# Patient Record
Sex: Female | Born: 1950
Health system: Southern US, Community
[De-identification: ages and names within clinical notes are randomized; demographics above are authoritative.]

## PROBLEM LIST (undated history)

## (undated) DIAGNOSIS — E785 Hyperlipidemia, unspecified: Secondary | ICD-10-CM

## (undated) DIAGNOSIS — L989 Disorder of the skin and subcutaneous tissue, unspecified: Secondary | ICD-10-CM

## (undated) DIAGNOSIS — E039 Hypothyroidism, unspecified: Secondary | ICD-10-CM

## (undated) DIAGNOSIS — I10 Essential (primary) hypertension: Secondary | ICD-10-CM

## (undated) DIAGNOSIS — K5790 Diverticulosis of intestine, part unspecified, without perforation or abscess without bleeding: Secondary | ICD-10-CM

## (undated) HISTORY — DX: Diverticulosis of intestine, part unspecified, without perforation or abscess without bleeding: K57.90

## (undated) HISTORY — PX: CHOLECYSTECTOMY: SHX55

## (undated) HISTORY — DX: Hypothyroidism, unspecified: E03.9

## (undated) HISTORY — PX: ABDOMINAL HYSTERECTOMY: SHX81

## (undated) HISTORY — PX: TONSILLECTOMY: SUR1361

## (undated) HISTORY — DX: Essential (primary) hypertension: I10

## (undated) HISTORY — DX: Hyperlipidemia, unspecified: E78.5

## (undated) HISTORY — DX: Disorder of the skin and subcutaneous tissue, unspecified: L98.9

---

## 2000-12-24 ENCOUNTER — Emergency Department (HOSPITAL_COMMUNITY): Admission: EM | Admit: 2000-12-24 | Discharge: 2000-12-24 | Payer: Self-pay | Admitting: Emergency Medicine

## 2002-12-09 ENCOUNTER — Encounter: Payer: Self-pay | Admitting: Emergency Medicine

## 2002-12-09 ENCOUNTER — Inpatient Hospital Stay (HOSPITAL_COMMUNITY): Admission: EM | Admit: 2002-12-09 | Discharge: 2002-12-11 | Payer: Self-pay | Admitting: Emergency Medicine

## 2008-07-31 ENCOUNTER — Ambulatory Visit (HOSPITAL_COMMUNITY): Admission: RE | Admit: 2008-07-31 | Discharge: 2008-07-31 | Payer: Self-pay | Admitting: Internal Medicine

## 2008-08-09 ENCOUNTER — Ambulatory Visit (HOSPITAL_COMMUNITY): Admission: RE | Admit: 2008-08-09 | Discharge: 2008-08-09 | Payer: Self-pay | Admitting: Internal Medicine

## 2009-11-25 IMAGING — MG MM DIGITAL DIAGNOSTIC UNILAT R
4 series · 4 of 4 positions shown · non-contrast
Comparison: Mammogram 07/31/2008

CLINICAL DATA: Possible mass right breast identified on recent
screening mammogram

DIGITAL DIAGNOSTIC RIGHT MAMMOGRAM WITHOUT CAD

[R CC]
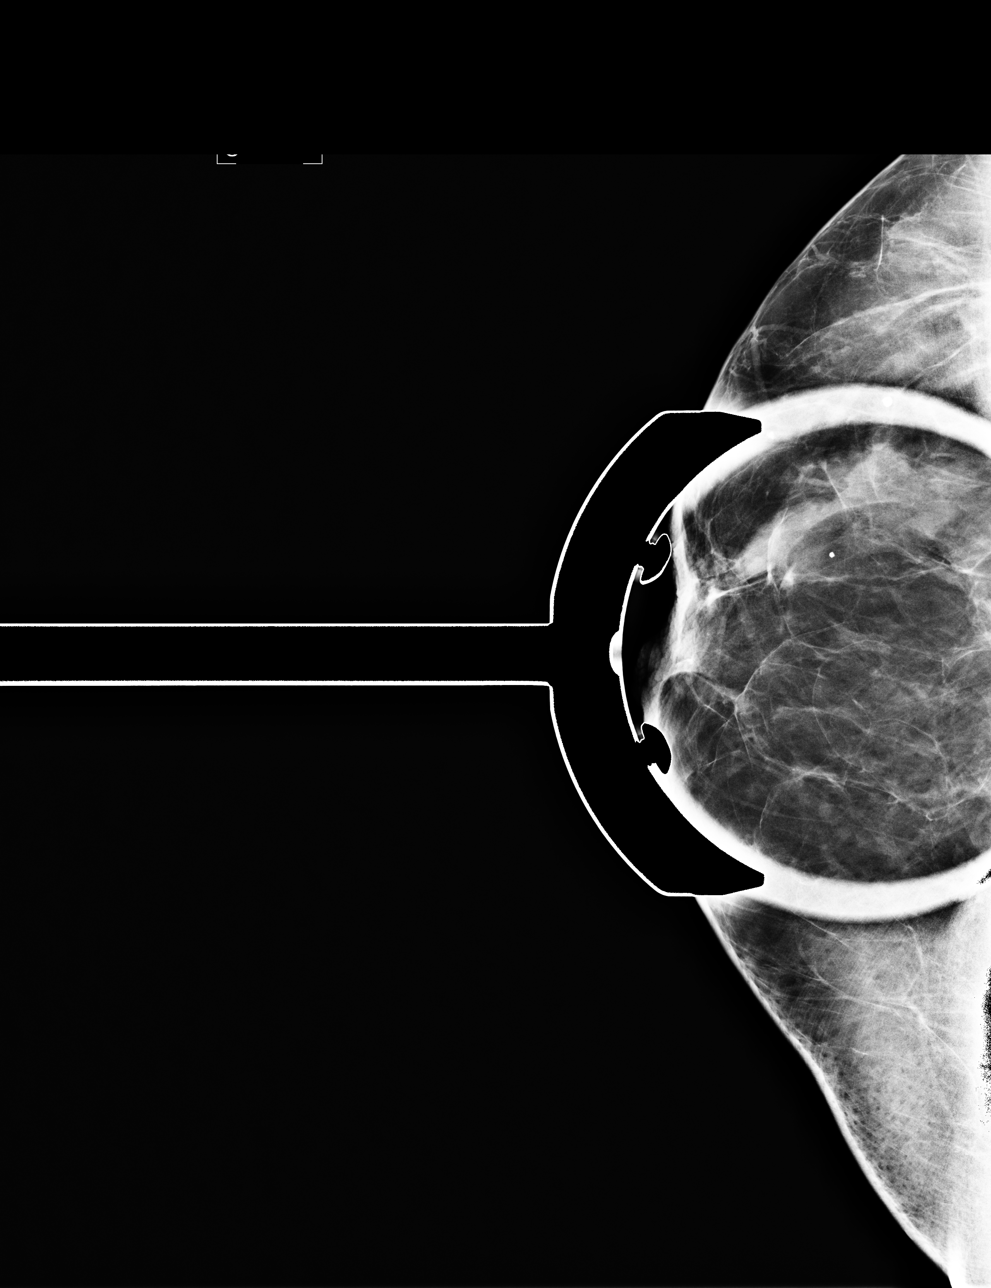

[R MLO (1 of 3)]
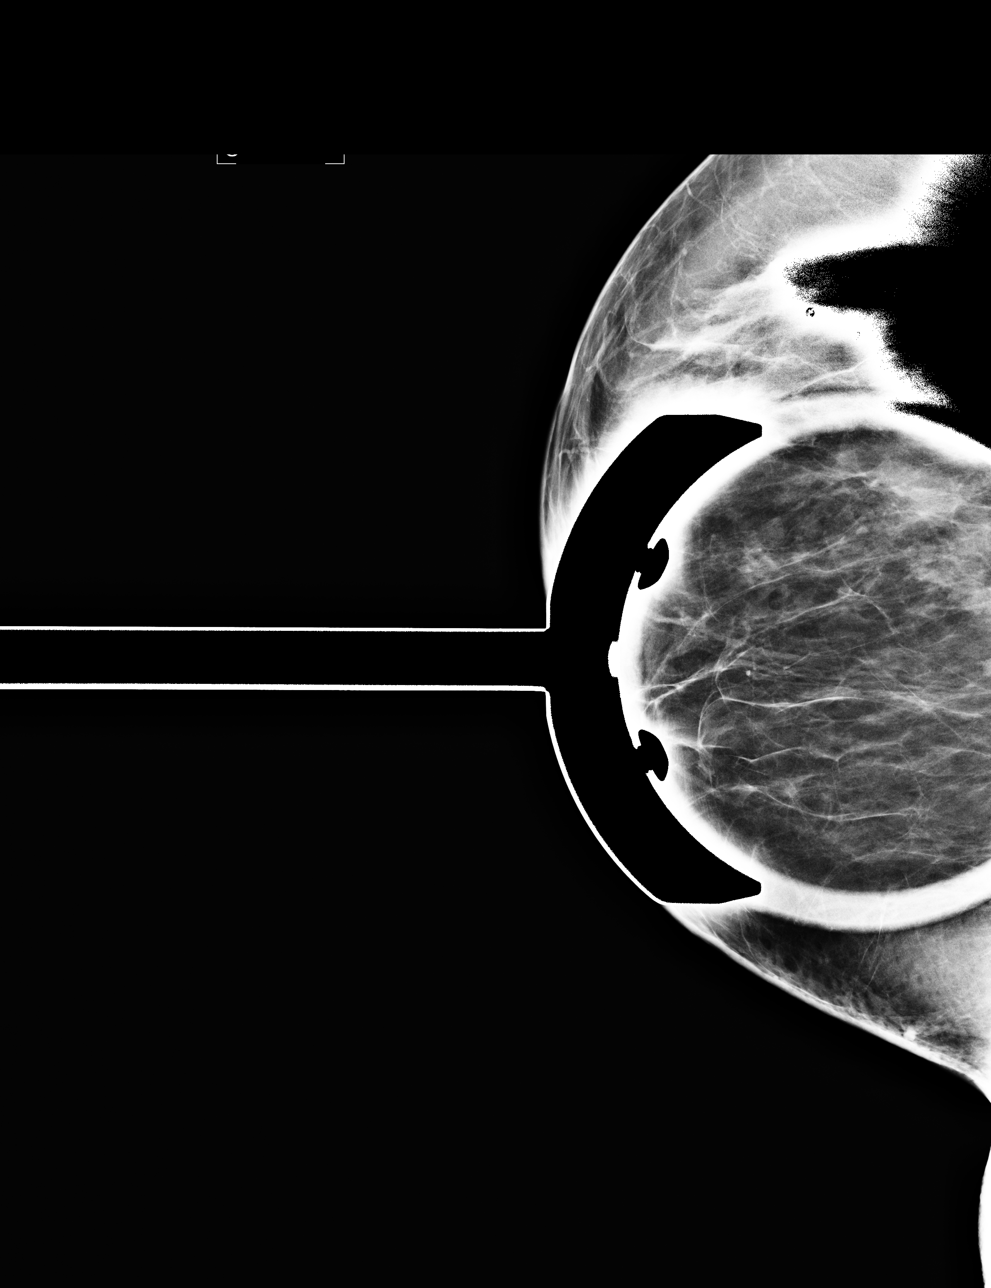

[R MLO (2 of 3)]
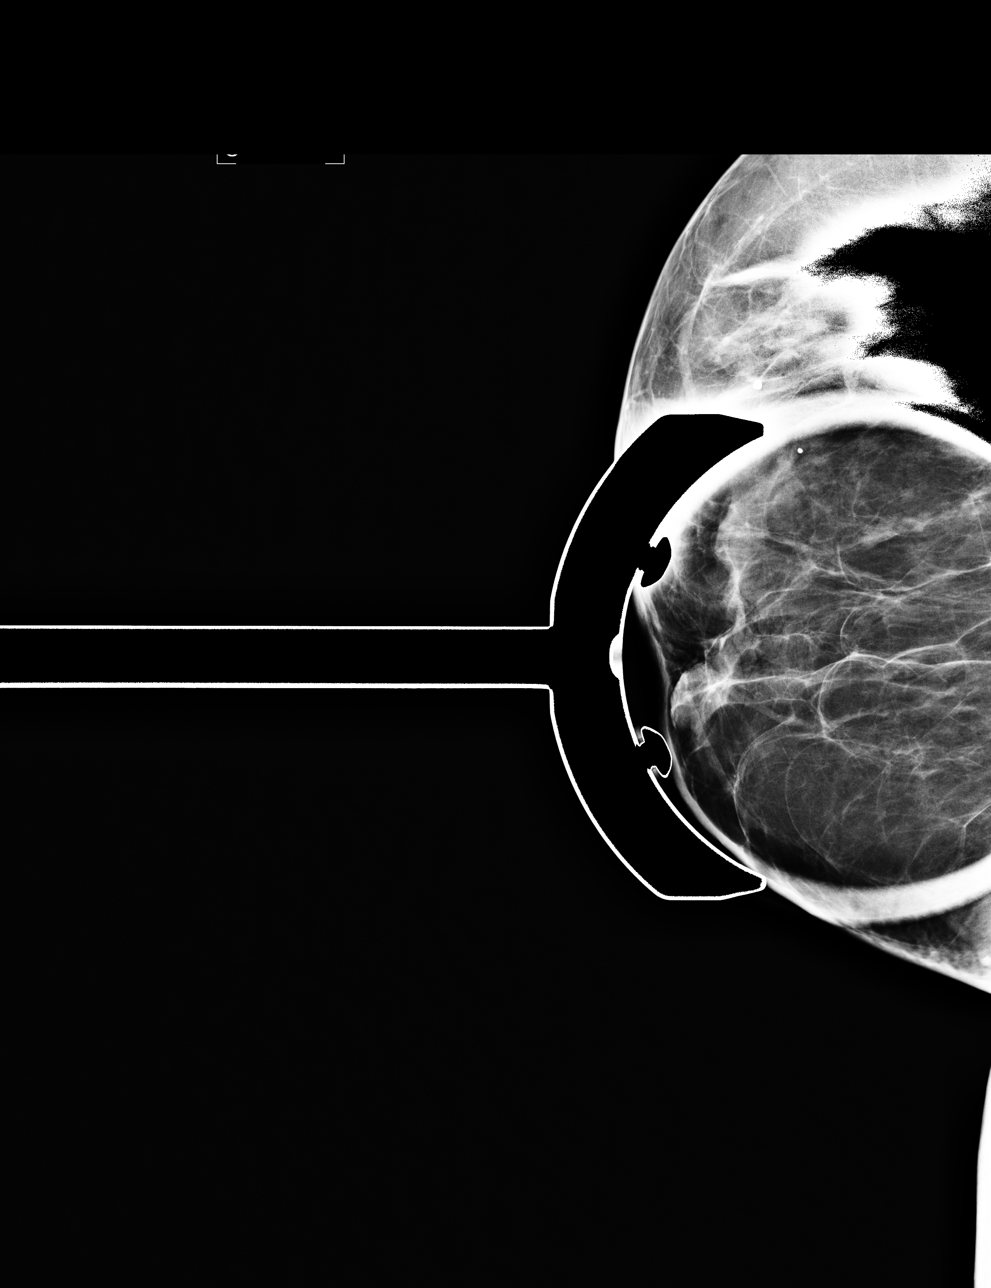

[R MLO (3 of 3)]
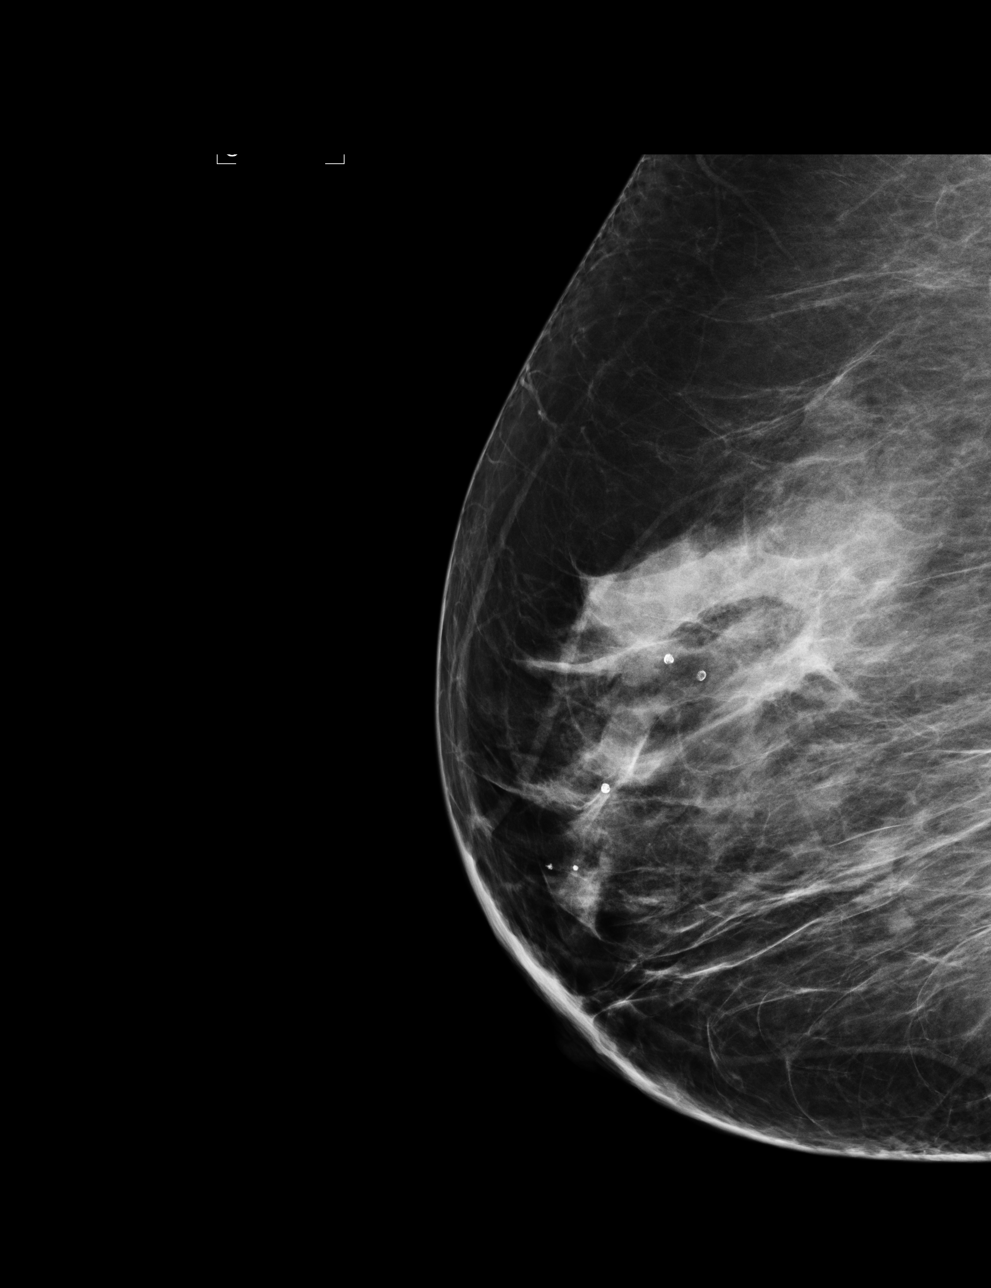

[4 of 4 positions shown; findings below may reference images not displayed]

FINDINGS: An MLO view of the right breast with the nipple in
profile was performed, as the nipple was rolled on the initial MLO
view performed on 07/31/2008.  On today's MLO view, no mass is
identified in the anterior right breast.  Additionally, focal spot
compression views of the retroareolar right breast show scattered
fibroglandular densities, without evidence of persistent mass or
architectural distortion.
IMPRESSION: No evidence of malignancy is identified in the right breast.
Suggest screening mammogram of both breasts in 1 year.

BI-RADS CATEGORY 1:  Negative.

REF:A1 DICTATED: 08/09/2008 [DATE]

## 2009-12-31 ENCOUNTER — Inpatient Hospital Stay (HOSPITAL_COMMUNITY): Admission: EM | Admit: 2009-12-31 | Discharge: 2010-01-03 | Payer: Self-pay | Admitting: Emergency Medicine

## 2009-12-31 ENCOUNTER — Encounter (INDEPENDENT_AMBULATORY_CARE_PROVIDER_SITE_OTHER): Payer: Self-pay | Admitting: General Surgery

## 2010-07-03 ENCOUNTER — Ambulatory Visit (HOSPITAL_COMMUNITY)
Admission: RE | Admit: 2010-07-03 | Discharge: 2010-07-03 | Payer: Self-pay | Source: Home / Self Care | Attending: Internal Medicine | Admitting: Internal Medicine

## 2010-09-21 LAB — URINALYSIS, ROUTINE W REFLEX MICROSCOPIC
Glucose, UA: NEGATIVE mg/dL
Protein, ur: 100 mg/dL — AB
Urobilinogen, UA: 1 mg/dL (ref 0.0–1.0)
pH: 6 (ref 5.0–8.0)

## 2010-09-21 LAB — CBC
HCT: 39.3 % (ref 36.0–46.0)
Hemoglobin: 13.1 g/dL (ref 12.0–15.0)
MCH: 28.6 pg (ref 26.0–34.0)
MCH: 28.8 pg (ref 26.0–34.0)
MCH: 29.1 pg (ref 26.0–34.0)
MCHC: 33.9 g/dL (ref 30.0–36.0)
MCV: 84.2 fL (ref 78.0–100.0)
MCV: 85 fL (ref 78.0–100.0)
Platelets: 291 10*3/uL (ref 150–400)
Platelets: 330 10*3/uL (ref 150–400)
RDW: 14.1 % (ref 11.5–15.5)
RDW: 14.6 % (ref 11.5–15.5)
WBC: 11.8 10*3/uL — ABNORMAL HIGH (ref 4.0–10.5)
WBC: 15.2 10*3/uL — ABNORMAL HIGH (ref 4.0–10.5)

## 2010-09-21 LAB — POCT I-STAT, CHEM 8
BUN: 12 mg/dL (ref 6–23)
Chloride: 104 mEq/L (ref 96–112)
Creatinine, Ser: 0.9 mg/dL (ref 0.4–1.2)
Glucose, Bld: 135 mg/dL — ABNORMAL HIGH (ref 70–99)
HCT: 42 % (ref 36.0–46.0)
TCO2: 24 mmol/L (ref 0–100)

## 2010-09-21 LAB — COMPREHENSIVE METABOLIC PANEL
ALT: 117 U/L — ABNORMAL HIGH (ref 0–35)
AST: 70 U/L — ABNORMAL HIGH (ref 0–37)
Albumin: 2.7 g/dL — ABNORMAL LOW (ref 3.5–5.2)
Alkaline Phosphatase: 75 U/L (ref 39–117)
BUN: 9 mg/dL (ref 6–23)
Chloride: 107 mEq/L (ref 96–112)
Creatinine, Ser: 0.93 mg/dL (ref 0.4–1.2)
GFR calc Af Amer: >60 mL/min (ref >60)
Glucose, Bld: 127 mg/dL — ABNORMAL HIGH (ref 70–99)
Sodium: 138 mEq/L (ref 135–145)
Total Bilirubin: 0.7 mg/dL (ref 0.3–1.2)

## 2010-09-21 LAB — DIFFERENTIAL
Basophils Relative: 0 % (ref 0–1)
Lymphocytes Relative: 9 % — ABNORMAL LOW (ref 12–46)
Neutro Abs: 13.5 10*3/uL — ABNORMAL HIGH (ref 1.7–7.7)
Neutrophils Relative %: 83 % — ABNORMAL HIGH (ref 43–77)

## 2010-09-21 LAB — URINE CULTURE: Colony Count: >=100000

## 2010-09-21 LAB — WET PREP, GENITAL
Trich, Wet Prep: NONE SEEN
WBC, Wet Prep HPF POC: NONE SEEN
Yeast Wet Prep HPF POC: NONE SEEN

## 2010-09-21 LAB — HEPATIC FUNCTION PANEL: Total Protein: 7.8 g/dL (ref 6.0–8.3)

## 2010-09-21 LAB — URINE MICROSCOPIC-ADD ON

## 2010-09-21 LAB — LIPASE, BLOOD: Lipase: 26 U/L (ref 11–59)

## 2010-11-21 NOTE — H&P (Signed)
NAME:  TANVEER, DOBBERSTEIN                      ACCOUNT NO.:  [id number]   MEDICAL RECORD NO.:  [id number]                   PATIENT TYPE:  INP   LOCATION:  0108                                 FACILITY:  [facility]   PHYSICIAN:  Abigail Miyamoto, M.D.              DATE OF BIRTH:  [birth year]   DATE OF ADMISSION:  12/08/2002  DATE OF DISCHARGE:                                HISTORY & PHYSICAL   CHIEF COMPLAINT:  Abdominal pain.   HISTORY:  This is a pleasant 61 year old female who presents with a several  day history of cramping abdominal pain.  She reports the pain starts more in  her epigastric and periumbilical area and has moved some to the right lower  quadrant.  She denies any nausea or vomiting with this and she has been  moving her bowels normally.  She denies any diarrhea or blood in stool.  She  also reports only a low grade temperature.  She reports no previous similar  history of abdominal pain.  She denies any dysuria.  She also has no chest  pain or shortness of breath.   PAST MEDICAL HISTORY:  Negative.   PAST SURGICAL HISTORY:  She has had a hysterectomy.   MEDICATIONS:  None.   ALLERGIES:  No known drug allergies.   SOCIAL HISTORY:  She does smoke a pack of cigarettes a day and drinks  alcohol rarely.   FAMILY HISTORY:  Negative for Crohn's disease.   REVIEW OF SYSTEMS:  Otherwise negative.   PHYSICAL EXAMINATION:  GENERAL:  Reveals an obese female in no acute  distress.  She is moderately well-appearing in no acute distress.  VITAL SIGNS:  Temperature is 97.5, respiratory rate is 16, pulse is 106,  blood pressure is 130/82.  EYES:  She is anicteric.  Her pupils are reactive.  EAR/NOSE/MOUTH AND THROAT:  Her ears and nose are normal in appearance.  Hearing is normal.  Oropharynx is clear.  NECK:  Supple.  There is no cervical adenopathy.  There is no thyromegaly.  LUNGS:  Effort is normal.  Clear to auscultation bilaterally.  CARDIOVASCULAR:  She is  tachycardic with a regular, rate and rhythm and no  murmurs.  There is no peripheral edema.  ABDOMEN:  Soft.  There is minimal guarding and tenderness in the epigastrium  and right lower quadrant.  There are no masses.  There is no  hepatosplenomegaly.  There are no hernias.  EXTREMITIES:  Warm and well perfused.   LABORATORY DATA:  Patient has a white blood count of 25.1, hemoglobin 15.1,  hematocrit 43.6 and platelets of 445.  Electrolytes are normal with a  potassium of 4.0, BUN of 10 and creatinine 0.8.  Liver function tests are  also normal.   X-RAY DATA:  Patient has a CAT scan of the abdomen and pelvis which shows  her to have inflammation of the terminal ileum.  There is no evidence of  perforation.  No free fluid.  The appendix is normal in size.  There are  also multiple renal cysts bilaterally and a small 1.3 cm right adrenal mass  consistent with an adenoma.  There is also a large gallstone.   ASSESSMENT/PLAN:  This is a 60 year old female with terminal ileitis.  It is  unsure whether this represents an infectious etiology versus inflammatory  disease as in Crohn's.  At this point the plan will be to place the patient  on IV antibiotics, admit her to the hospital.  If the process does not  resolve quickly we may need a gastroenterology consult for endoscopy.  We  will hold on the steroids at this time.                                                Abigail Miyamoto, M.D.    DB/MEDQ  D:  12/09/2002  T:  12/09/2002  Job:  [id number]

## 2012-12-15 ENCOUNTER — Telehealth (HOSPITAL_COMMUNITY): Payer: Self-pay | Admitting: Dietician

## 2012-12-15 NOTE — Telephone Encounter (Signed)
Received referral via fax from Dr. Margo Aye for dx: obesity, high cholesterol.

## 2012-12-15 NOTE — Telephone Encounter (Signed)
Called and left message on pt voicemail at [phone].

## 2012-12-15 NOTE — Telephone Encounter (Signed)
Pt left voicemail at [phone]. Called back at [phone] and left another message on voicemail.

## 2012-12-16 NOTE — Telephone Encounter (Signed)
Received message from pt at [phone]. Called back [phone]. Appointment scheduled for 12/11/12 at 1000.

## 2013-01-10 ENCOUNTER — Encounter (HOSPITAL_COMMUNITY): Payer: Self-pay | Admitting: Dietician

## 2013-01-10 DIAGNOSIS — E785 Hyperlipidemia, unspecified: Secondary | ICD-10-CM | POA: Insufficient documentation

## 2013-01-10 DIAGNOSIS — E039 Hypothyroidism, unspecified: Secondary | ICD-10-CM | POA: Insufficient documentation

## 2013-01-10 DIAGNOSIS — L989 Disorder of the skin and subcutaneous tissue, unspecified: Secondary | ICD-10-CM | POA: Insufficient documentation

## 2013-01-10 DIAGNOSIS — I10 Essential (primary) hypertension: Secondary | ICD-10-CM | POA: Insufficient documentation

## 2013-01-10 NOTE — Progress Notes (Signed)
Outpatient Initial Nutrition Assessment  Date:01/10/2013   Appt Start Time: 0923  Referring Physician: Dr. Margo Aye Reason for Visit: obesity, HTN  Nutrition Assessment:  Height: 5\' 2"  (157.5 cm)   Weight: 222 lb (100.699 kg)   IBW: 110# %IBW: 202% UBW: 210# %UBW: 106% Body mass index is 40.59 kg/(m^2). Meets criteria for extreme obesity, class III. Goal Weight: 200# (10% weight loss of current body weight) Weight hx: Pt reports UBW: 210-215#, which she last weighed in 06/2012 (12# (5.7%) wt gain). She reports he maintained a wt of 130# up until retiring 10 years ago. This is a 92# (70%) wt gain x 10 years  Estimated nutritional needs: 1500-1600 kcals daily, 81-101 grams protein daily, 1.5-1.6 L fluid daily  PMH:  Past Medical History  Diagnosis Date  . Hypothyroid   . HTN (hypertension)   . Dyslipidemia   . Skin disorder     Medications:  Current Outpatient Rx  Name  Route  Sig  Dispense  Refill  . levothyroxine (SYNTHROID, LEVOTHROID) 112 MCG tablet   Oral   Take 112 mcg by mouth daily before breakfast.         . losartan (COZAAR) 50 MG tablet   Oral   Take 50 mg by mouth daily.         . Multiple Vitamin (MULTIVITAMIN WITH MINERALS) TABS   Oral   Take 1 tablet by mouth daily.           Labs: CMP     Component Value Date/Time   NA 138 01/02/2010 0410   K 4.2 01/02/2010 0410   CL 107 01/02/2010 0410   CO2 26 01/02/2010 0410   GLUCOSE 127* 01/02/2010 0410   BUN 9 01/02/2010 0410   CREATININE 0.93 01/02/2010 0410   CALCIUM 7.8* 01/02/2010 0410   PROT 6.3 01/02/2010 0410   ALBUMIN 2.7* 01/02/2010 0410   AST 70* 01/02/2010 0410   ALT 117* 01/02/2010 0410   ALKPHOS 75 01/02/2010 0410   BILITOT 0.7 01/02/2010 0410   GFRNONAA >60 01/02/2010 0410   GFRAA  Value: >60        The eGFR has been calculated using the MDRD equation. This calculation has not been validated in all clinical situations. eGFR's persistently <60 mL/min signify possible Chronic Kidney Disease. 01/02/2010 0410     Lipid Panel  No results found for this basename: chol, trig, hdl, cholhdl, vldl, ldlcalc     No results found for this basename: HGBA1C   Lab Results  Component Value Date   CREATININE 0.93 01/02/2010    Most recent labs (Dr. Margo Aye) from 12/06/12: Na: 135. K: 4.2, Cl: 107, CO2: 20, BUN: 11, Creat: 0.79, Glucose: 98, Total Cholesterol: 173, Triglycerides: 228. HDL: 33, LDL: 94  Lifestyle/ social habits: Ms. Storlie lives alone in [place], [place]. 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 She reports her stress level at "-4", citing she has no stress. She reports physical activity in the past, including daily swimming at [facility], which she discontinued 6 years aog because she did not like the pool conditions. She reports from September to May, she will jog in place for 45 minutes daily. She reports she has not been doing this as much recently, as her daily weeding and gardening aggravates her ankles and achilles tendon. She will also walk occasionally.   Nutrition hx/habits: Ms. Audia reports no recent  Changes to her diet. She states "I monitor my sweet intake. When I  do cook sweets, I use applesauce instead of fat and cut 1/2 of the sugar and substitute splenda for the other 1/2. I don't eat fast food. 95% of my food is homemade". She enjoys cooking and makes her own salad dressing. She incorporates a lot of fresh vegetables. She informs me that most of her proteins come from beans, fish, and grains (farro and quinoa mostly).  She reports that she was able to maintain her weight prior to retirement because she was on a schedule and now gets bored easily. She occasionally admits to snacking. She also reports she "does well with portion control", using a rammekin to measure out most foods. However, there may be some underreporting.   Diet recall: Breakfast (6 AM): eggs, TVP sausage OR broccoli, black beans and black coffee; Lunch (12 PM): 2 in piece of  salmon and broccoli in sauce (homemade curry sauce made with fresh mushroom rue, low fat milk, and curry powder); Dinner (4 PM): salad with greens, carrot, 1/2 avocado, olive, beets, and salad dressing (oil, mustard, siracha, and sugar); HS snack (10 PM): PBJ on toast or biscuit.   Nutrition Diagnosis: Involuntary weight gain r/t physical inactivity, disordered eating patter AEB diet recall/ interview, 70% wt gain x 10 years.  Nutrition Intervention: Nutrition rx: 1200 kcal NAS, no sugar added diet; low calorie beverages only; 3 meals per day; no snacks; minimum of 2.5 hours physical activity daily  Education/Counseling Provided: Educated pt on principles of weight management. Discussed principles of energy expenditure and how changes in diet and physical activity affect weight status. Discussed nutritional content of commonly eaten foods and suggested healthier alternatives. Educated pt on plate method and a general, healthful diet that includes low fat dairy, lean meats, whole fruits and vegetables, and whole grains most often. Discussed importance of a healthy diet along with regular physical activity (at least 30 minutes 5 times per week) to achieve weight loss goals. Encouraged slow, moderate weight loss (0.5-2# weight loss per week) and adopting healthy lifestyle changes vs. obtaining a certain body type or weight. Encouraged weighing self weekly at a consistent day and time of choice. Showed pt functionality of MyFitnessPal and encouraged using a food diary to better track caloric intake. Provided AND Nutrition Care Manual's "Weight Loss Tips" and "!500 Calorie Diet" handouts. Used TeachBack to assess understanding.   Understanding, Motivation, Ability to Follow Recommendations: Expect fair compliance.   Monitoring and Evaluation: Goals: 1) 0.5-2# wt loss per week; 2) At least 30 minutes of physical activity daily  Recommendations: 1) For weight loss: No Less than 1200 kcals daily; 2) Keep food  diary; 3) Break exercise up into smaller, more frequent sessions; 4) Find a hobby and develop a daily schedule with hobbies, exercise, and household duties  F/U: 4-8 weeks. F/u scheduled for Tuesday, 02/28/13 at 0900.   Melody Haver, RD, LDN 01/10/2013  Appt EndTime: 1100

## 2013-01-11 ENCOUNTER — Telehealth (HOSPITAL_COMMUNITY): Payer: Self-pay | Admitting: Dietician

## 2013-01-11 NOTE — Telephone Encounter (Signed)
Received voicemail left on [phone]. Pt reports she is frustrated with MyFitnessPal app and is requesting an alternative to calorie counting.

## 2013-01-11 NOTE — Telephone Encounter (Signed)
Called at [phone]. Gave pt alternative calorie tracker (Calorie Counter, Lose It!, [employer]). Also discussed [email or web address], for nutrition information. Pt grateful for information. Also educated briefly on diet to lower triglycerides.

## 2013-02-28 ENCOUNTER — Encounter (HOSPITAL_COMMUNITY): Payer: Self-pay | Admitting: Dietician

## 2013-02-28 NOTE — Progress Notes (Signed)
Follow-Up Outpatient Nutrition Note Date: 02/28/2013  Appt Start Time: 0839  Nutrition Assessment:  Current weight: Weight: 218 lb (98.884 kg)  BMI: Body mass index is 39.86 kg/(m^2).  Weight changes: -4# (1.8%) x 6 weeks  Bethany Cunningham reports she feels well today. She is both disappointment and proud of her progress. She wonders if she should have lost weight. She has been cutting back on bread and sweets so that she can incorporate a 1/4 cup serving of ice cream daily into her diet. She reports that her plan is to slowly decrease ice cream intake to a few times per week.  She continues to use to Lose It app, but reports frustration of the accuracy of it; she reports that she is unable to enter her own recipes into the app and, therefore, she must make an educated guess. Snacks include grapes and 1/2 pbj sandwich. She reports average calorie intake is 1200 kcals. She is taking 1000-1200 steps daily, per her pedometer. She is trying to increase the amount of "aerobic steps" per day.  She reports she is also using the RD telephone services provided by the state.   Labs: CMP     Component Value Date/Time   NA 138 01/02/2010 0410   K 4.2 01/02/2010 0410   CL 107 01/02/2010 0410   CO2 26 01/02/2010 0410   GLUCOSE 127* 01/02/2010 0410   BUN 9 01/02/2010 0410   CREATININE 0.93 01/02/2010 0410   CALCIUM 7.8* 01/02/2010 0410   PROT 6.3 01/02/2010 0410   ALBUMIN 2.7* 01/02/2010 0410   AST 70* 01/02/2010 0410   ALT 117* 01/02/2010 0410   ALKPHOS 75 01/02/2010 0410   BILITOT 0.7 01/02/2010 0410   GFRNONAA >60 01/02/2010 0410   GFRAA  Value: >60        The eGFR has been calculated using the MDRD equation. This calculation has not been validated in all clinical situations. eGFR's persistently <60 mL/min signify possible Chronic Kidney Disease. 01/02/2010 0410    Lipid Panel  No results found for this basename: chol, trig, hdl, cholhdl, vldl, ldlcalc     No results found for this basename: HGBA1C   Lab Results   Component Value Date   CREATININE 0.93 01/02/2010     Diet recall: Breakfast: 2 scrambled eggs with 1tbs shredded cheese, 2 tbs ketchup, steamed hash brown patty; Lunch: 1/4 cup black beans, 1/4 cup morning [employer] meat protein, peppers, tomatillos; Dinner: 1/2 bell pepper, black olives, 1/3 tomato, feta cheese. Beverages are coffee and water.   Nutrition Diagnosis: Involuntary weight gain r/t physical inactivity, disordered eating pattern- progressing  Nutrition Intervention: Nutrition rx: 1200 kcal NAS, no sugar added diet; low calorie beverages only; 3 meals per day; no snacks; minimum of 2.5 hours physical activity daily  Education/ counseling provided: Reviewed food diary and exercise. Praised pt on progress made. Discussed specific ways to improve diet and increase physical activity. Had long discussion about make positive lifelong change to promote a healthy lifestyle. Teachback method used.  Understanding/Motivation/ Ability to follow recommendations: Expect fair to good compliance.  Monitoring and Evaluation: Previous Goals: 1) 0.5-2# wt loss per week; 2) At least 30 minutes of physical activity daily  Goals for next visit: 1) 0.5-2# wt loss per week; 2) At least 30 minutes of physical activity daily  Recommendations: 1) Continue with food diary; 2) Slowly increase intensity of exercise  F/U: 4-6 weeks. Scheduled for Tuesday, 04/18/13 at 0900.  Bethany Cunningham, RD, LDN Date:02/28/2013 Appt EndTime: 1610

## 2013-03-31 ENCOUNTER — Telehealth (HOSPITAL_COMMUNITY): Payer: Self-pay | Admitting: Dietician

## 2013-03-31 NOTE — Telephone Encounter (Signed)
Received voicemail from pt left on 9/25 at 1719. Pt would like to reschedule appointment on 10/14.

## 2013-03-31 NOTE — Telephone Encounter (Signed)
Called at [phone]. Appointment rescheduled for 04/24/13 at 0900.

## 2013-04-24 ENCOUNTER — Encounter (HOSPITAL_COMMUNITY): Payer: Self-pay | Admitting: Dietician

## 2013-04-24 NOTE — Progress Notes (Signed)
Follow-Up Outpatient Nutrition Note Date: 04/24/2013  Appt Start Time: 0839  Nutrition Assessment:  Current weight: Weight: 214 lb (97.07 kg)  BMI: Body mass index is 39.13 kg/(m^2).  Weight changes: -4# (1.9%) x 6 weeks  Bethany Cunningham continues to make good progress. She recently got dental work done, but reports no problems. She reports that her stomach has "been going out", but attributes this to eat a large amount of tomatoes. She also reports that she ate an "unsually rich" dish the other day (quiche with butternut squash, cheese, creme fresh, and heavy cream), which caused her to have an upset stomach. Diet habits remain the same. She reports she has not been eating many fruits, due to limited seasonal fruits she enjoys. She reports she continues to use to Lose It! App and that she won the "Truck Full of Vegetables" badge for increased vegetable intake.  She has also been using teas as broths other than broth for added flavors and less calories.  She also reports she has increased her intensity of exercise. She typically engages in about 1.5 hours of physical activity daily. She does 45 minutes of walking in the AM and PM; she reports 30 minutes are "aerobic" steps, per her pedometer.   Labs: CMP     Component Value Date/Time   NA 138 01/02/2010 0410   K 4.2 01/02/2010 0410   CL 107 01/02/2010 0410   CO2 26 01/02/2010 0410   GLUCOSE 127* 01/02/2010 0410   BUN 9 01/02/2010 0410   CREATININE 0.93 01/02/2010 0410   CALCIUM 7.8* 01/02/2010 0410   PROT 6.3 01/02/2010 0410   ALBUMIN 2.7* 01/02/2010 0410   AST 70* 01/02/2010 0410   ALT 117* 01/02/2010 0410   ALKPHOS 75 01/02/2010 0410   BILITOT 0.7 01/02/2010 0410   GFRNONAA >60 01/02/2010 0410   GFRAA  Value: >60        The eGFR has been calculated using the MDRD equation. This calculation has not been validated in all clinical situations. eGFR's persistently <60 mL/min signify possible Chronic Kidney Disease. 01/02/2010 0410    Lipid Panel  No results found  for this basename: chol,  trig,  hdl,  cholhdl,  vldl,  ldlcalc     No results found for this basename: HGBA1C   Lab Results  Component Value Date   CREATININE 0.93 01/02/2010     Diet recall: Breakfast: 2 scrambled eggs with 1tbs shredded cheese, 2 tbs ketchup, steamed hash brown patty; Lunch: 1/4 cup black beans, 1/4 cup morning [employer] meat protein, peppers, tomatillos; Dinner: 1/2 bell pepper, black olives, 1/3 tomato, feta cheese. Beverages are coffee and water.   Nutrition Diagnosis: Involuntary weight gain r/t physical inactivity, disordered eating pattern- progressing  Nutrition Intervention: Nutrition rx: 1200 kcal NAS, no sugar added diet; low calorie beverages only; 3 meals per day; no snacks; minimum of 2.5 hours physical activity daily  Education/ counseling provided: Reviewed food diary and exercise. Praised pt on progress made. Discussed specific ways to improve diet and increase physical activity. Had long discussion about make positive lifelong change to promote a healthy lifestyle. Teachback method used.  Understanding/Motivation/ Ability to follow recommendations: Expect fair to good compliance.  Monitoring and Evaluation: Previous Goals: 1) 0.5-2# wt loss per week- goal met; 2) At least 30 minutes of physical activity daily- goal met  Goals for next visit: 1) 0.5-2# wt loss per week; 2) At least 30 minutes of physical activity daily  Recommendations: 1) Continue with food diary; 2)  Slowly increase intensity of exercise  F/U: 4-6 weeks. Scheduled for Monday, 06/12/13 at 0900.  Markes Shatswell A. Mayford Knife, RD, LDN Date:04/24/2013 Appt EndTime: 9811

## 2013-05-13 ENCOUNTER — Emergency Department (HOSPITAL_COMMUNITY)
Admission: EM | Admit: 2013-05-13 | Discharge: 2013-05-13 | Disposition: A | Discharge status: Home / Self Care | Payer: BC Managed Care – PPO | Attending: Emergency Medicine | Admitting: Emergency Medicine

## 2013-05-13 ENCOUNTER — Encounter (HOSPITAL_COMMUNITY): Payer: Self-pay | Admitting: Emergency Medicine

## 2013-05-13 ENCOUNTER — Emergency Department (HOSPITAL_COMMUNITY): Payer: BC Managed Care – PPO

## 2013-05-13 DIAGNOSIS — K625 Hemorrhage of anus and rectum: Secondary | ICD-10-CM

## 2013-05-13 DIAGNOSIS — E039 Hypothyroidism, unspecified: Secondary | ICD-10-CM | POA: Insufficient documentation

## 2013-05-13 DIAGNOSIS — K5790 Diverticulosis of intestine, part unspecified, without perforation or abscess without bleeding: Secondary | ICD-10-CM

## 2013-05-13 DIAGNOSIS — I10 Essential (primary) hypertension: Secondary | ICD-10-CM | POA: Insufficient documentation

## 2013-05-13 DIAGNOSIS — Z79899 Other long term (current) drug therapy: Secondary | ICD-10-CM | POA: Insufficient documentation

## 2013-05-13 DIAGNOSIS — Z872 Personal history of diseases of the skin and subcutaneous tissue: Secondary | ICD-10-CM | POA: Insufficient documentation

## 2013-05-13 DIAGNOSIS — K5730 Diverticulosis of large intestine without perforation or abscess without bleeding: Secondary | ICD-10-CM | POA: Insufficient documentation

## 2013-05-13 DIAGNOSIS — R197 Diarrhea, unspecified: Secondary | ICD-10-CM | POA: Insufficient documentation

## 2013-05-13 LAB — URINALYSIS W MICROSCOPIC + REFLEX CULTURE
Bilirubin Urine: NEGATIVE
Glucose, UA: NEGATIVE mg/dL
Ketones, ur: NEGATIVE mg/dL
Leukocytes, UA: NEGATIVE
Nitrite: NEGATIVE
Urobilinogen, UA: 0.2 mg/dL (ref 0.0–1.0)

## 2013-05-13 LAB — COMPREHENSIVE METABOLIC PANEL
ALT: 42 U/L — ABNORMAL HIGH (ref 0–35)
Albumin: 4.2 g/dL (ref 3.5–5.2)
Alkaline Phosphatase: 64 U/L (ref 39–117)
Calcium: 9.4 mg/dL (ref 8.4–10.5)
GFR calc non Af Amer: 76 mL/min — ABNORMAL LOW (ref >90)
Glucose, Bld: 105 mg/dL — ABNORMAL HIGH (ref 70–99)
Potassium: 3.6 mEq/L (ref 3.5–5.1)
Total Bilirubin: 0.3 mg/dL (ref 0.3–1.2)
Total Protein: 8.2 g/dL (ref 6.0–8.3)

## 2013-05-13 LAB — LIPASE, BLOOD: Lipase: 42 U/L (ref 11–59)

## 2013-05-13 LAB — CBC WITH DIFFERENTIAL/PLATELET
Eosinophils Absolute: 0.2 10*3/uL (ref 0.0–0.7)
Hemoglobin: 14.4 g/dL (ref 12.0–15.0)
Lymphs Abs: 3 10*3/uL (ref 0.7–4.0)
Neutro Abs: 6.4 10*3/uL (ref 1.7–7.7)
RDW: 14 % (ref 11.5–15.5)

## 2013-05-13 LAB — OCCULT BLOOD, POC DEVICE: Fecal Occult Bld: POSITIVE — AB

## 2013-05-13 MED ORDER — SODIUM CHLORIDE 0.9 % IV SOLN
INTRAVENOUS | Status: DC
  Administered 2013-05-13: 21:00:00 via INTRAVENOUS

## 2013-05-13 MED ORDER — IOHEXOL 300 MG/ML  SOLN
100.0000 mL | Freq: Once | INTRAMUSCULAR | Status: AC | PRN
  Administered 2013-05-13: 100 mL via INTRAVENOUS

## 2013-05-13 MED ORDER — IOHEXOL 300 MG/ML  SOLN
50.0000 mL | Freq: Once | INTRAMUSCULAR | Status: AC | PRN
  Administered 2013-05-13: 50 mL via ORAL

## 2013-05-13 NOTE — ED Provider Notes (Signed)
CSN: [id number]     Arrival date & time 05/13/13  1711 History   First MD Initiated Contact with Patient 05/13/13 1857     Chief Complaint  Patient presents with  . Diarrhea  . Rectal Bleeding    HPI Pt was seen at 1920. Per pt, c/o gradual onset and persistence of multiple intermittent episodes of diarrhea that began approx 0900 this morning. Pt describes the diarrhea as "loose stools." States this afternoon she felt "better" but then had a BM "with blood on it." States she saw red blood on the stool and on the toilet paper when she wiped. Denies rectal pain, no rectal injury, no abd pain, no N/V, no fevers, no back pain. Denies easy bruising or any other areas of bleeding.   Past Medical History  Diagnosis Date  . Hypothyroid   . HTN (hypertension)   . Dyslipidemia   . Skin disorder    Past Surgical History  Procedure Laterality Date  . Cholecystectomy    . Tonsillectomy    . Abdominal hysterectomy      History  Substance Use Topics  . Smoking status: Never Smoker   . Smokeless tobacco: Not on file  . Alcohol Use: No    Review of Systems ROS: Statement: All systems negative except as marked or noted in the HPI; Constitutional: Negative for fever and chills. ; ; Eyes: Negative for eye pain, redness and discharge. ; ; ENMT: Negative for ear pain, hoarseness, nasal congestion, sinus pressure and sore throat. ; ; Cardiovascular: Negative for chest pain, palpitations, diaphoresis, dyspnea and peripheral edema. ; ; Respiratory: Negative for cough, wheezing and stridor. ; ; Gastrointestinal: +diarrhea, rectal bleeding. Negative for nausea, vomiting, abdominal pain, hematemesis, jaundice. . ; ; Genitourinary: Negative for dysuria, flank pain and hematuria. ; ; Musculoskeletal: Negative for back pain and neck pain. Negative for swelling and trauma.; ; Skin: Negative for pruritus, rash, abrasions, blisters, bruising and skin lesion.; ; Neuro: Negative for headache, lightheadedness and neck  stiffness. Negative for weakness, altered level of consciousness , altered mental status, extremity weakness, paresthesias, involuntary movement, seizure and syncope.     Allergies  Review of patient's allergies indicates no known allergies.  Home Medications   Current Outpatient Rx  Name  Route  Sig  Dispense  Refill  . levothyroxine (SYNTHROID, LEVOTHROID) 112 MCG tablet   Oral   Take 112 mcg by mouth daily before breakfast.         . losartan (COZAAR) 50 MG tablet   Oral   Take 50 mg by mouth daily.          BP 138/72  Pulse 88  Temp(Src) 97.2 F (36.2 C) (Oral)  Ht 5\' 2"  (1.575 m)  Wt 220 lb (99.791 kg)  BMI 40.23 kg/m2  SpO2 100% Physical Exam 1925: Physical examination:  Nursing notes reviewed; Vital signs and O2 SAT reviewed;  Constitutional: Well developed, Well nourished, Well hydrated, In no acute distress; Head:  Normocephalic, atraumatic; Eyes: EOMI, PERRL, No scleral icterus; ENMT: Mouth and pharynx normal, Mucous membranes moist; Neck: Supple, Full range of motion, No lymphadenopathy; Cardiovascular: Regular rate and rhythm, No gallop; Respiratory: Breath sounds clear & equal bilaterally, No rales, rhonchi, wheezes.  Speaking full sentences with ease, Normal respiratory effort/excursion; Chest: Nontender, Movement normal; Abdomen: Soft, Nontender, Nondistended, Normal bowel sounds. Rectal exam performed w/permission of pt and ED RN chaperone present.  Anal tone normal.  Non-tender, soft maroon stool in rectal vault, heme positive.  No fissures, +  external hemorrhoids without thrombosis or bleeding. No palp masses.;;; Genitourinary: No CVA tenderness; Extremities: Pulses normal, No tenderness, No edema, No calf edema or asymmetry.; Neuro: AA&Ox3, Major CN grossly intact.  Speech clear. Climbs on and off stretcher easily by herself. Gait steady. No gross focal motor or sensory deficits in extremities.; Skin: Color normal, Warm, Dry.   ED Course  Procedures     EKG  Interpretation   None       MDM  MDM Reviewed: previous chart, nursing note and vitals Reviewed previous: labs Interpretation: labs and CT scan   Results for orders placed during the hospital encounter of 05/13/13  CBC WITH DIFFERENTIAL      Result Value Range   WBC 10.0  4.0 - 10.5 K/uL   RBC 5.03  3.87 - 5.11 MIL/uL   Hemoglobin 14.4  12.0 - 15.0 g/dL   HCT 16.1  09.6 - 04.5 %   MCV 85.5  78.0 - 100.0 fL   MCH 28.6  26.0 - 34.0 pg   MCHC 33.5  30.0 - 36.0 g/dL   RDW 40.9  81.1 - 91.4 %   Platelets 339  150 - 400 K/uL   Neutrophils Relative % 64  43 - 77 %   Neutro Abs 6.4  1.7 - 7.7 K/uL   Lymphocytes Relative 30  12 - 46 %   Lymphs Abs 3.0  0.7 - 4.0 K/uL   Monocytes Relative 4  3 - 12 %   Monocytes Absolute 0.4  0.1 - 1.0 K/uL   Eosinophils Relative 2  0 - 5 %   Eosinophils Absolute 0.2  0.0 - 0.7 K/uL   Basophils Relative 1  0 - 1 %   Basophils Absolute 0.1  0.0 - 0.1 K/uL  COMPREHENSIVE METABOLIC PANEL      Result Value Range   Sodium 138  135 - 145 mEq/L   Potassium 3.6  3.5 - 5.1 mEq/L   Chloride 105  96 - 112 mEq/L   CO2 23  19 - 32 mEq/L   Glucose, Bld 105 (*) 70 - 99 mg/dL   BUN 16  6 - 23 mg/dL   Creatinine, Ser 7.82  0.50 - 1.10 mg/dL   Calcium 9.4  8.4 - 95.6 mg/dL   Total Protein 8.2  6.0 - 8.3 g/dL   Albumin 4.2  3.5 - 5.2 g/dL   AST 29  0 - 37 U/L   ALT 42 (*) 0 - 35 U/L   Alkaline Phosphatase 64  39 - 117 U/L   Total Bilirubin 0.3  0.3 - 1.2 mg/dL   GFR calc non Af Amer 76 (*) >90 mL/min   GFR calc Af Amer 88 (*) >90 mL/min  LIPASE, BLOOD      Result Value Range   Lipase 42  11 - 59 U/L  URINALYSIS W MICROSCOPIC + REFLEX CULTURE      Result Value Range   Color, Urine YELLOW  YELLOW   APPearance CLEAR  CLEAR   Specific Gravity, Urine 1.020  1.005 - 1.030   pH 6.0  5.0 - 8.0   Glucose, UA NEGATIVE  NEGATIVE mg/dL   Hgb urine dipstick MODERATE (*) NEGATIVE   Bilirubin Urine NEGATIVE  NEGATIVE   Ketones, ur NEGATIVE  NEGATIVE mg/dL    Protein, ur TRACE (*) NEGATIVE mg/dL   Urobilinogen, UA 0.2  0.0 - 1.0 mg/dL   Nitrite NEGATIVE  NEGATIVE   Leukocytes, UA NEGATIVE  NEGATIVE   RBC /  HPF 7-10  <3 RBC/hpf  OCCULT BLOOD, POC DEVICE      Result Value Range   Fecal Occult Bld POSITIVE (*) NEGATIVE   Ct Abdomen Pelvis W Contrast 05/13/2013   CLINICAL DATA:  Diarrhea with rectal bleeding for 1 day  EXAM: CT ABDOMEN AND PELVIS WITH CONTRAST  TECHNIQUE: Multidetector CT imaging of the abdomen and pelvis was performed using the standard protocol following bolus administration of intravenous contrast.  CONTRAST:  50mL OMNIPAQUE IOHEXOL 300 MG/ML SOLN, OMNIPAQUE IOHEXOL 300 MG/ML SOLN  COMPARISON:  12/31/2009  FINDINGS: Visualized portions of the lung bases clear. Severe diffuse fatty infiltration of the liver. No focal hepatic abnormalities. Gallbladder is surgically absent. Spleen normal. Pancreas normal. Adrenal gland normal on the right. Left adrenal gland mass measures 3 cm in maximal diameter and shows average attenuation value of 39. There are multiple bilateral renal low-attenuation lesions. The largest in the lower pole the right kidney measures 5 cm with average attenuation value of 5. The largest on the left is in the upper pole and measures 4 cm with average attenuation value of 0.  Aorta is not dilated. There is minimal aortic calcification. Appendix is normal. There is severe diverticulosis of the sigmoid colon. There is no evidence of inflammatory change to suggest diverticulitis. Bladder is normal. Reproductive organs are absent. There are no acute musculoskeletal abnormalities.  IMPRESSION: 1. Severe fatty infiltration of the liver 2. Stable benign left adrenal mass 3. Diverticulosis sigmoid colon   Electronically Signed   By: Esperanza Heir M.D.   On: 05/13/2013 22:01    2215:  Pt continues to feel "ok" and wants to go home now. No stooling while in the ED. Has tol PO well without N/V. Ambulated around ED several times  with steady gait, easy resps, NAD. Abd remains benign, VSS. H/H per baseline. Dx and testing d/w pt.  Questions answered.  Verb understanding, agreeable to d/c home with outpt f/u.     Laray Anger, DO 05/16/13 [phone]

## 2013-05-13 NOTE — ED Notes (Signed)
Diarrhea began at 0900 this morning. Pt states she noticed bright red blood in the toilet with her last BM which was ~1500. Denies any pain or any other symptoms at this time

## 2013-05-13 NOTE — ED Notes (Signed)
Pt denies pain at present time.  Pt ambulating in room with no difficulty.  No distress noted.

## 2013-05-16 ENCOUNTER — Telehealth (HOSPITAL_COMMUNITY): Payer: Self-pay | Admitting: Dietician

## 2013-05-16 NOTE — Telephone Encounter (Signed)
Received message from pt left on 05/15/13 at 1131. Pt reports she was recently in ER and was dx with diverticulosis. Want to know about diet for diverticulosis.

## 2013-05-19 NOTE — Telephone Encounter (Signed)
Called at 1459. Reviewed low fiber diet and encouraged slow gradual introduction to high fiber foods to heal GI tract and prevent flare ups. Pt grateful for information.

## 2013-06-06 ENCOUNTER — Ambulatory Visit: Payer: BC Managed Care – PPO | Admitting: Gastroenterology

## 2013-06-12 ENCOUNTER — Encounter (HOSPITAL_COMMUNITY): Payer: Self-pay | Admitting: Dietician

## 2013-06-12 NOTE — Progress Notes (Signed)
Follow-Up Outpatient Nutrition Note Date: 06/12/2013  Appt Start Time: 0854  Nutrition Assessment:  Current weight: Weight: 215 lb (97.523 kg)  BMI: Body mass index is 39.31 kg/(m^2).  Weight changes: -4# (1.9%) x 6 weeks  Bethany Cunningham has maintained her weight. She reports that she is struggling during the holidays, due to increased food availability. She reports that Thanksgiving is not a big holiday for her because there are few vegetarian options. However, Christmas is a large celebration with many family members and friends. She does most of the cooking. She shares with me that she is testing out various rich recipes, which is likely contributing to her weight gain.  She reports she continues to exercise and stay on her diet as best as she can. Discussed weight maintenance during the holidays; this put her at ease.   Labs: CMP     Component Value Date/Time   NA 138 05/13/2013 2038   K 3.6 05/13/2013 2038   CL 105 05/13/2013 2038   CO2 23 05/13/2013 2038   GLUCOSE 105* 05/13/2013 2038   BUN 16 05/13/2013 2038   CREATININE 0.80 05/13/2013 2038   CALCIUM 9.4 05/13/2013 2038   PROT 8.2 05/13/2013 2038   ALBUMIN 4.2 05/13/2013 2038   AST 29 05/13/2013 2038   ALT 42* 05/13/2013 2038   ALKPHOS 64 05/13/2013 2038   BILITOT 0.3 05/13/2013 2038   GFRNONAA 76* 05/13/2013 2038   GFRAA 88* 05/13/2013 2038    Lipid Panel  No results found for this basename: chol,  trig,  hdl,  cholhdl,  vldl,  ldlcalc     No results found for this basename: HGBA1C   Lab Results  Component Value Date   CREATININE 0.80 05/13/2013     Diet recall: Breakfast: 2 scrambled eggs with 1tbs shredded cheese, 2 tbs ketchup, steamed hash brown patty; Lunch: 1/4 cup black beans, 1/4 cup morning [employer] meat protein, peppers, tomatillos; Dinner: 1/2 bell pepper, black olives, 1/3 tomato, feta cheese. Beverages are coffee and water.   Nutrition Diagnosis: Involuntary weight gain r/t physical inactivity, disordered eating pattern-  progressing  Nutrition Intervention: Nutrition rx: 1200 kcal NAS, no sugar added diet; low calorie beverages only; 3 meals per day; no snacks; minimum of 2.5 hours physical activity daily  Education/ counseling provided: Reviewed food diary and exercise. Praised pt on progress made. Discussed specific ways to improve diet and increase physical activity. Had long discussion about make positive lifelong change to promote a healthy lifestyle. Discussed weight maintenance during the holidays. Teachback method used.  Understanding/Motivation/ Ability to follow recommendations: Expect fair to good compliance.  Monitoring and Evaluation: Previous Goals: 1) 0.5-2# wt loss per week- goal met; 2) At least 30 minutes of physical activity daily- goal met  Goals for next visit: 1) Weight maintenance; 2) At least 30 minutes of physical activity daily  Recommendations: 1) Continue with food diary; 2) Slowly increase intensity of exercise  F/U: 4-8 weeks. Scheduled for Monday, 08/07/13 at 0900.  Carolyne Whitsel A. Mayford Knife, RD, LDN Date:06/12/2013 Appt EndTime: 100

## 2013-06-19 ENCOUNTER — Encounter: Payer: Self-pay | Admitting: Gastroenterology

## 2013-06-19 ENCOUNTER — Encounter (INDEPENDENT_AMBULATORY_CARE_PROVIDER_SITE_OTHER): Payer: Self-pay

## 2013-06-19 ENCOUNTER — Ambulatory Visit (INDEPENDENT_AMBULATORY_CARE_PROVIDER_SITE_OTHER): Payer: BC Managed Care – PPO | Admitting: Gastroenterology

## 2013-06-19 VITALS — BP 149/81 | HR 82 | Temp 96.5°F | Wt 216.6 lb

## 2013-06-19 DIAGNOSIS — R945 Abnormal results of liver function studies: Secondary | ICD-10-CM | POA: Insufficient documentation

## 2013-06-19 DIAGNOSIS — R7989 Other specified abnormal findings of blood chemistry: Secondary | ICD-10-CM

## 2013-06-19 DIAGNOSIS — K625 Hemorrhage of anus and rectum: Secondary | ICD-10-CM

## 2013-06-19 NOTE — Assessment & Plan Note (Addendum)
Recent rectal bleeding possibly diverticular bleed. No prior colonoscopy. Recommend colonoscopy in the near future.  I have discussed the risks, alternatives, benefits with regards to but not limited to the risk of reaction to medication, bleeding, infection, perforation and the patient is agreeable to proceed. Written consent to be obtained.  Patient describes intermittent episodes of heartburn and "choking" not always related with meals. I offered her a barium pill esophagram versus upper endoscopy for further evaluation however she declined at this time. She will let us know if symptoms become more frequent.

## 2013-06-19 NOTE — Progress Notes (Addendum)
Primary Care Physician:  Catalina Pizza, MD  Primary Gastroenterologist:  Jonette Eva, MD    Chief Complaint  Patient presents with  . Diarrhea    referred brom ed    HPI:  Bethany Cunningham is a 62 y.o. female here for further evaluation of recent diarrhea, rectal bleeding. Seen in the emergency department on 05/13/2013. Symptoms lasted less than 24 hours. Acute onset associated with crampy abdominal pain. No nausea or vomiting or fever. No ill contacts or antibiotics. On rectal exam in the emergency department she had soft maroon-colored stool which was Hemoccult positive. Her hemoglobin was 14.4. CT showed severe fatty liver, diverticulosis in the sigmoid colon.   No prior colonoscopy. Intermittent bloating. Especially after meals. Does not overeat. Feels full early. Sometimes chokes on food. May go several months inbetween. Last episode about 3 months ago. Happens more in the morning. Happens at restaurants. Intermittent heartburn but very sporadic. May be bad for a few weeks and then none for over a year. Trying to lose weight. Has been seen by nutritionist. Only eats fish as meat. Veggies. Has lost 6 pounds in the past 3 months. BM very regular. Rare significant abdominal pain. Recent rectal bleeding with diarrhea, for about a day. Cramping associated with it. Prior smoker, 35 years. BCs for headache. Used to use in the past quite a lot but not much anymore. 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   Current Outpatient Prescriptions  Medication Sig Dispense Refill  . levothyroxine (SYNTHROID, LEVOTHROID) 112 MCG tablet Take 112 mcg by mouth daily before breakfast.      . losartan (COZAAR) 50 MG tablet Take 50 mg by mouth daily.       No current facility-administered medications for this visit.    Allergies as of 06/19/2013  . (No Known Allergies)    Past Medical History  Diagnosis Date  . Hypothyroid   . HTN (hypertension)   . Dyslipidemia   . Skin disorder   . Diverticulosis      Past Surgical History  Procedure Laterality Date  . Cholecystectomy    . Tonsillectomy    . Abdominal hysterectomy      Family History  Problem Relation Age of Onset  . Colon cancer Neg Hx   . Inflammatory bowel disease Neg Hx   . AAA (abdominal aortic aneurysm) Mother     History   Social History  . Marital Status: Single    Spouse Name: N/A    Number of Children: 0  . Years of Education: N/A   Occupational History  . retired Runner, broadcasting/film/video    Social History Main Topics  . Smoking status: Never Smoker   . Smokeless tobacco: Not on file     Comment: Former smoker/ quit x 8 years  . Alcohol Use: No  . Drug Use: No  . Sexual Activity: Not on file   Other Topics Concern  . Not on file   Social History Narrative  . No narrative on file      ROS:  General: Negative for anorexia, weight loss, fever, chills, fatigue, weakness. Eyes: Negative for vision changes.  ENT: Negative for hoarseness, difficulty swallowing , nasal congestion. CV: Negative for chest pain, angina, palpitations, dyspnea on exertion, peripheral edema.  Respiratory: Negative for dyspnea at rest, dyspnea on exertion, cough, sputum, wheezing.  GI: See history of present illness. GU:  Negative for dysuria, hematuria, urinary incontinence, urinary frequency, nocturnal urination.  MS: Negative for joint pain, low back pain.  Derm: Negative for rash or itching.  Neuro: Negative for weakness, abnormal sensation, seizure, frequent headaches, memory loss, confusion.  Psych: Negative for anxiety, depression, suicidal ideation, hallucinations.  Endo: Negative for unusual weight change.  Heme: Negative for bruising or bleeding. Allergy: Negative for rash or hives.    Physical Examination:  BP 149/81  Pulse 82  Temp(Src) 96.5 F (35.8 C) (Oral)  Wt 216 lb 9.6 oz (98.249 kg)   General: Well-nourished, well-developed in no acute distress.  Head: Normocephalic, atraumatic.   Eyes: Conjunctiva pink, no  icterus. Mouth: Oropharyngeal mucosa moist and pink , no lesions erythema or exudate. Neck: Supple without thyromegaly, masses, or lymphadenopathy.  Lungs: Clear to auscultation bilaterally.  Heart: Regular rate and rhythm, no murmurs rubs or gallops.  Abdomen: Bowel sounds are normal, nontender, nondistended, no hepatosplenomegaly or masses, no abdominal bruits or    hernia , no rebound or guarding.   Rectal: Deferred Extremities: No lower extremity edema. No clubbing or deformities.  Neuro: Alert and oriented x 4 , grossly normal neurologically.  Skin: Warm and dry, no rash or jaundice.   Psych: Alert and cooperative, normal mood and affect.  Labs: Lab Results  Component Value Date   WBC 10.0 2013-06-03   HGB 14.4 06-03-2013   HCT 43.0 06-03-13   MCV 85.5 2013-06-03   PLT 339 06-03-2013   Lab Results  Component Value Date   CREATININE 0.80 06/03/13   BUN 16 2013/06/03   NA 138 06-03-2013   K 3.6 06/03/13   CL 105 06-03-13   CO2 23 2013/06/03   Lab Results  Component Value Date   ALT 42* 06/03/13   AST 29 2013-06-03   ALKPHOS 64 Jun 03, 2013   BILITOT 0.3 2013/06/03   Lab Results  Component Value Date   LIPASE 42 2013/06/03   Heme + stool 03-Jun-2013 in ER.  Imaging Studies: CT A/P with contrast on 06/03/2013  IMPRESSION:  1. Severe fatty infiltration of the liver  2. Stable benign left adrenal mass  3. Diverticulosis sigmoid colon

## 2013-06-19 NOTE — Assessment & Plan Note (Signed)
ALT minimally elevated. Fatty liver noted on CT scan. Fatty liver structures provided to the patient. Would recommend repeating LFTs in 6 months we she can do with her PCP. If they remain elevated, consider checking viral markers.

## 2013-06-19 NOTE — Progress Notes (Signed)
cc'd to pcp 

## 2013-06-19 NOTE — Patient Instructions (Addendum)
1. We have provided you with some available dates for your colonoscopy. Please call and schedule when you have arranged transportation.  2. You need to have your liver blood work done every 6 months.  Fatty Liver Fatty liver is the accumulation of fat in liver cells. It is also called hepatosteatosis or steatohepatitis. It is normal for your liver to contain some fat. If fat is more than 5 to 10% of your liver's weight, you have fatty liver.  There are often no symptoms (problems) for years while damage is still occurring. People often learn about their fatty liver when they have medical tests for other reasons. Fat can damage your liver for years or even decades without causing problems. When it becomes severe, it can cause fatigue, weight loss, weakness, and confusion. This makes you more likely to develop more serious liver problems. The liver is the largest organ in the body. It does a lot of work and often gives no warning signs when it is sick until late in a disease. The liver has many important jobs including:  Breaking down foods.  Storing vitamins, iron, and other minerals.  Making proteins.  Making bile for food digestion.  Breaking down many products including medications, alcohol and some poisons. CAUSES  There are a number of different conditions, medications, and poisons that can cause a fatty liver. Eating too many calories causes fat to build up in the liver. Not processing and breaking fats down normally may also cause this. Certain conditions, such as obesity, diabetes, and high triglycerides also cause this. Most fatty liver patients tend to be middle-aged and over weight.  Some causes of fatty liver are:  Alcohol over consumption.  Malnutrition.  Steroid use.  Valproic acid toxicity.  Obesity.  Cushing's syndrome.  Poisons.  Tetracycline in high dosages.  Pregnancy.  Diabetes.  Hyperlipidemia.  Rapid weight loss. Some people develop fatty liver even  having none of these conditions. SYMPTOMS  Fatty liver most often causes no problems. This is called asymptomatic.  It can be diagnosed with blood tests and also by a liver biopsy.  It is one of the most common causes of minor elevations of liver enzymes on routine blood tests.  Specialized Imaging of the liver using ultrasound, CT (computed tomography) scan, or MRI (magnetic resonance imaging) can suggest a fatty liver but a biopsy is needed to confirm it.  A biopsy involves taking a small sample of liver tissue. This is done by using a needle. It is then looked at under a microscope by a specialist. TREATMENT  It is important to treat the cause. Simple fatty liver without a medical reason may not need treatment.  Weight loss, fat restriction, and exercise in overweight patients produces inconsistent results but is worth trying.  Fatty liver due to alcohol toxicity may not improve even with stopping drinking.  Good control of diabetes may reduce fatty liver.  Lower your triglycerides through diet, medication or both.  Eat a balanced, healthy diet.  Increase your physical activity.  Get regular checkups from a liver specialist.  There are no medical or surgical treatments for a fatty liver or NASH, but improving your diet and increasing your exercise may help prevent or reverse some of the damage. PROGNOSIS  Fatty liver may cause no damage or it can lead to an inflammation of the liver. This is, called steatohepatitis. When it is linked to alcohol abuse, it is called alcoholic steatohepatitis. It often is not linked to alcohol. It is  then called nonalcoholic steatohepatitis, or NASH. Over time the liver may become scarred and hardened. This condition is called cirrhosis. Cirrhosis is serious and may lead to liver failure or cancer. NASH is one of the leading causes of cirrhosis. About 10-20% of Americans have fatty liver and a smaller 2-5% has NASH. Document Released: 08/07/2005  Document Revised: 09/14/2011 Document Reviewed: 09/30/2005 [facility] Patient Information 2014 [place], [place].

## 2013-06-20 ENCOUNTER — Other Ambulatory Visit: Payer: Self-pay | Admitting: Gastroenterology

## 2013-06-20 ENCOUNTER — Telehealth: Payer: Self-pay | Admitting: *Deleted

## 2013-06-20 DIAGNOSIS — K625 Hemorrhage of anus and rectum: Secondary | ICD-10-CM

## 2013-06-20 MED ORDER — PEG 3350-KCL-NA BICARB-NACL 420 G PO SOLR: 4000.0000 mL | ORAL | Status: DC

## 2013-06-20 NOTE — Telephone Encounter (Signed)
TCS  

## 2013-07-04 ENCOUNTER — Encounter (HOSPITAL_COMMUNITY): Payer: Self-pay | Admitting: Pharmacy Technician

## 2013-07-14 NOTE — OR Nursing (Signed)
Called patient for pre-op call. Patient states that she would like to cancel her procedure for 6-7 months until she starts receiving Medicare.

## 2013-07-17 ENCOUNTER — Telehealth: Payer: Self-pay | Admitting: General Practice

## 2013-07-17 ENCOUNTER — Encounter (HOSPITAL_COMMUNITY): Admission: RE | Payer: Self-pay | Source: Ambulatory Visit

## 2013-07-17 ENCOUNTER — Ambulatory Visit (HOSPITAL_COMMUNITY)
Admission: RE | Admit: 2013-07-17 | Payer: BC Managed Care – PPO | Source: Ambulatory Visit | Admitting: Gastroenterology

## 2013-07-17 SURGERY — COLONOSCOPY
Anesthesia: Moderate Sedation

## 2013-07-17 NOTE — Telephone Encounter (Signed)
Noted  

## 2013-07-17 NOTE — Telephone Encounter (Signed)
Per Threasa Beards in Endo patient called and stated she would like to wait to have her procedure.  She said she will have Medicare in about 6 months and she will call back to reschedule.  Routing to Darius Bump for review

## 2013-09-04 ENCOUNTER — Telehealth (HOSPITAL_COMMUNITY): Payer: Self-pay | Admitting: Dietician

## 2013-09-04 NOTE — Telephone Encounter (Signed)
Received voicemails left on 09/02/13 at [place] and 0703 and on 09/04/13 at 0855. Pt unable to make appt due to weather. Called back at 1507. Appointment rescheduled for 10/09/13 at 0900.

## 2013-10-09 ENCOUNTER — Encounter (HOSPITAL_COMMUNITY): Payer: Self-pay | Admitting: Dietician

## 2013-10-10 NOTE — Progress Notes (Signed)
Follow-Up Outpatient Nutrition Note Date: 10/09/2013  Appt Start Time: 0854  Nutrition Assessment:  Current weight: Weight: 210 lb (95.255 kg)  BMI: Body mass index is 38.4 kg/(m^2).  Weight changes: -5# (2.3%) x 4 months  Blanch biggest complaint is that she has not lost more weight. She reports that she suffered an injury over a month ago, where she slipped on her bathroom floor after getting out of the shower to let her cat outside. She reports the fall resulted in her assuming a split position and resulting in several sore muscles. She complains that her knee continues to give out if she turns too quickly. She did not get evaluated after this incident, but requested a referral to a sports medicine specialist, but has not heard anything back at this time. She reports her biggest desire is to find a professional who will recommend exercises to resume her prior level of physical function. Prior to her injury, she reports she was exercising 40 minutes in the evening and the morning. She continues to use her pedometer and reports walking 15000 steps daily, 10000 which are "aerobic". She is slowly improving her level of activity, but is frustrated at her lack of progress ("it's still not right", expressing that she would have lost more weight if she did not injure herself ("It ticked me off"). She was unable to exercise at all for one week after the accident occurred.  Labs: CMP     Component Value Date/Time   NA 138 05/13/2013 2038   K 3.6 05/13/2013 2038   CL 105 05/13/2013 2038   CO2 23 05/13/2013 2038   GLUCOSE 105* 05/13/2013 2038   BUN 16 05/13/2013 2038   CREATININE 0.80 05/13/2013 2038   CALCIUM 9.4 05/13/2013 2038   PROT 8.2 05/13/2013 2038   ALBUMIN 4.2 05/13/2013 2038   AST 29 05/13/2013 2038   ALT 42* 05/13/2013 2038   ALKPHOS 64 05/13/2013 2038   BILITOT 0.3 05/13/2013 2038   GFRNONAA 76* 05/13/2013 2038   GFRAA 88* 05/13/2013 2038    Lipid Panel  No results found for this basename: chol,   trig,  hdl,  cholhdl,  vldl,  ldlcalc     No results found for this basename: HGBA1C   Lab Results  Component Value Date   CREATININE 0.80 05/13/2013     Diet recall: Breakfast: 2 scrambled eggs with 1tbs shredded cheese, 2 tbs ketchup, steamed hash brown patty; Lunch: 1/4 cup black beans, 1/4 cup [employer] meat protein, peppers, tomatillos; Dinner: 1/2 bell pepper, black olives, 1/3 tomato, feta cheese. Beverages are coffee and water.   Nutrition Diagnosis: Involuntary weight gain r/t physical inactivity, disordered eating pattern- progressing  Nutrition Intervention: Nutrition rx: 1200 kcal NAS, no sugar added diet; low calorie beverages only; 3 meals per day; no snacks; minimum of 2.5 hours physical activity daily  Education/ counseling provided: Reviewed food diary and exercise. Praised pt on progress made. Discussed specific ways to improve diet and increase physical activity. Had long discussion about make positive lifelong change to promote a healthy lifestyle. Provided encouragement in regards to keeping motivation through set back from injury. Teachback method used.  Understanding/Motivation/ Ability to follow recommendations: Expect fair to good compliance.  Monitoring and Evaluation: Previous Goals: 1) 0.5-2# wt loss per week- progressing; 2) At least 30 minutes of physical activity daily- goal met  Goals for next visit: 1) Weight maintenance; 2) At least 30 minutes of physical activity daily  Recommendations: 1) Continue with food  diary; 2) Slowly increase intensity of exercise  F/U: 8-12 weeks. Scheduled for Monday, 12/09/13 at 0900.  Dakiyah Heinke A. Jimmye Norman, RD, LDN Date:10/09/2013 Appt EndTime: 4718

## 2013-10-11 ENCOUNTER — Encounter (HOSPITAL_COMMUNITY): Payer: Self-pay | Admitting: Dietician

## 2013-12-21 ENCOUNTER — Other Ambulatory Visit (HOSPITAL_COMMUNITY): Payer: Self-pay

## 2013-12-21 DIAGNOSIS — J45909 Unspecified asthma, uncomplicated: Secondary | ICD-10-CM

## 2014-01-08 ENCOUNTER — Encounter (HOSPITAL_COMMUNITY): Payer: Self-pay | Admitting: Dietician

## 2014-01-09 ENCOUNTER — Encounter (HOSPITAL_COMMUNITY): Payer: Self-pay | Admitting: Dietician

## 2014-01-09 NOTE — Progress Notes (Signed)
Follow-Up Outpatient Nutrition Note Date: 01/08/2014  Appt Start Time: 0849  Nutrition Assessment:  Current weight: Weight: 213 lb (96.616 kg)  BMI: Body mass index is 38.95 kg/(m^2).  Weight changes: +3# (1.4%) x 3 months  Bethany Cunningham has some concerns about her health. She shares that the will undergo an pulmonary function test on 02/02/14 for pulmonary status. She expresses curiosity if her breathing problem is exacerbating her ability to be as physically active as she wants to be.  She reports her knee "is still not right". She states that she is able to see a specialist now "if I want to". She reports progressive improvement, stating it now only bothers her if she is doing side kicks. She continues to walk using her pedometer. She walks 30-45 minutes per day. She estimated walking 9,000-15,000 steps daily on average. She also occasionally uses a boxing exercise video for additional exercise.  She reports that she has not been as vigilant with her food choices. Due to her computer malfunctioning, she is unable to keep up with her food diary. Additionally, she shared that her cat of 18 years recently passed away. She also shares that she has been eating homemade ice pops made of yogurt and fruit as a substitute for ice cream.   Labs: CMP     Component Value Date/Time   NA 138 05/13/2013 2038   K 3.6 05/13/2013 2038   CL 105 05/13/2013 2038   CO2 23 05/13/2013 2038   GLUCOSE 105* 05/13/2013 2038   BUN 16 05/13/2013 2038   CREATININE 0.80 05/13/2013 2038   CALCIUM 9.4 05/13/2013 2038   PROT 8.2 05/13/2013 2038   ALBUMIN 4.2 05/13/2013 2038   AST 29 05/13/2013 2038   ALT 42* 05/13/2013 2038   ALKPHOS 64 05/13/2013 2038   BILITOT 0.3 05/13/2013 2038   GFRNONAA 76* 05/13/2013 2038   GFRAA 88* 05/13/2013 2038    Lipid Panel  No results found for this basename: chol,  trig,  hdl,  cholhdl,  vldl,  ldlcalc     No results found for this basename: HGBA1C   Lab Results  Component Value Date   CREATININE 0.80  05/13/2013     Diet recall: Breakfast: 2 scrambled eggs with 1tbs shredded cheese, 2 tbs ketchup, steamed hash brown patty; Lunch: 1/4 cup black beans, 1/4 cup [employer] meat protein, peppers, tomatillos; Dinner: 1/2 bell pepper, black olives, 1/3 tomato, feta cheese. Beverages are coffee and water.   Nutrition Diagnosis: Involuntary weight gain r/t physical inactivity, disordered eating pattern- progressing  Nutrition Intervention: Nutrition rx: 1200 kcal NAS, no sugar added diet; low calorie beverages only; 3 meals per day; no snacks; minimum of 2.5 hours physical activity daily  Education/ counseling provided: Reviewed food diary and exercise. Discussed specific ways to improve diet and increase physical activity. Encouraged continuance positive lifelong change to promote a healthy lifestyle. Teachback method used.  Understanding/Motivation/ Ability to follow recommendations: Expect fair to good compliance.  Monitoring and Evaluation: Previous Goals: 1) 0.5-2# wt loss per week- deteriorated; 2) At least 30 minutes of physical activity daily- goal met  Goals for next visit: 1) Weight maintenance; 2) At least 30 minutes of physical activity daily  Recommendations: 1) Resume food diary; 2) Continue increase intensity of exercise  F/U: PRN. Informed pt that this RD is unavailable for outpatient follow-up in 3 months, due transition of outpatient services pending at [place]. Informed her that care would not be transferred over and if she would  like to continue RD follow-up, recommend new referral be sent to [facility]. Offered telephone follow-up for questions during transition.  Jenifer A. Jimmye Norman, RD, LDN Date:01/08/2014 Appt EndTime: 8127

## 2014-02-02 ENCOUNTER — Ambulatory Visit (HOSPITAL_COMMUNITY)
Admission: RE | Admit: 2014-02-02 | Discharge: 2014-02-02 | Disposition: A | Discharge status: Home / Self Care | Payer: Medicare Other | Source: Ambulatory Visit | Attending: Internal Medicine | Admitting: Internal Medicine

## 2014-02-02 DIAGNOSIS — J45909 Unspecified asthma, uncomplicated: Secondary | ICD-10-CM | POA: Insufficient documentation

## 2014-02-02 MED ORDER — ALBUTEROL SULFATE (2.5 MG/3ML) 0.083% IN NEBU
2.5000 mg | INHALATION_SOLUTION | Freq: Once | RESPIRATORY_TRACT | Status: AC
  Administered 2014-02-02: 2.5 mg via RESPIRATORY_TRACT

## 2014-02-12 LAB — PULMONARY FUNCTION TEST
DL/VA % PRED: 117 %
DL/VA: 5.35 ml/min/mmHg/L
DLCO COR: 20.42 ml/min/mmHg
DLCO UNC: 20.42 ml/min/mmHg
DLCO cor % pred: 94 %
DLCO unc % pred: 94 %
FEF 25-75 POST: 2.59 L/s
FEF 25-75 Pre: 2.03 L/sec
FEF2575-%Change-Post: 27 %
FEF2575-%PRED-POST: 128 %
FEF2575-%Pred-Pre: 100 %
FEV1-%Change-Post: 6 %
FEV1-%Pred-Post: 93 %
FEV1-%Pred-Pre: 88 %
FEV1-Post: 2.09 L
FEV1-Pre: 1.97 L
FEV1FVC-%Change-Post: 2 %
FEV1FVC-%Pred-Pre: 105 %
FEV6-%CHANGE-POST: 2 %
FEV6-%PRED-PRE: 86 %
FEV6-%Pred-Post: 88 %
FEV6-Post: 2.48 L
FEV6-Pre: 2.41 L
FEV6FVC-%Pred-Post: 104 %
FEV6FVC-%Pred-Pre: 104 %
FVC-%CHANGE-POST: 3 %
FVC-%PRED-POST: 85 %
FVC-%PRED-PRE: 82 %
FVC-PRE: 2.41 L
FVC-Post: 2.51 L
POST FEV6/FVC RATIO: 100 %
PRE FEV1/FVC RATIO: 82 %
Post FEV1/FVC ratio: 83 %
Pre FEV6/FVC Ratio: 100 %
RV % pred: 113 %
RV: 2.26 L
TLC % pred: 102 %
TLC: 4.85 L

## 2014-02-25 DIAGNOSIS — IMO0002 Reserved for concepts with insufficient information to code with codable children: Secondary | ICD-10-CM | POA: Diagnosis not present

## 2014-02-25 DIAGNOSIS — T6391XA Toxic effect of contact with unspecified venomous animal, accidental (unintentional), initial encounter: Secondary | ICD-10-CM | POA: Diagnosis not present

## 2014-04-10 DIAGNOSIS — I10 Essential (primary) hypertension: Secondary | ICD-10-CM | POA: Diagnosis not present

## 2014-04-10 DIAGNOSIS — E119 Type 2 diabetes mellitus without complications: Secondary | ICD-10-CM | POA: Diagnosis not present

## 2014-04-10 DIAGNOSIS — E785 Hyperlipidemia, unspecified: Secondary | ICD-10-CM | POA: Diagnosis not present

## 2014-04-12 DIAGNOSIS — E6609 Other obesity due to excess calories: Secondary | ICD-10-CM | POA: Diagnosis not present

## 2014-04-12 DIAGNOSIS — I10 Essential (primary) hypertension: Secondary | ICD-10-CM | POA: Diagnosis not present

## 2014-04-12 DIAGNOSIS — R7301 Impaired fasting glucose: Secondary | ICD-10-CM | POA: Diagnosis not present

## 2014-04-12 DIAGNOSIS — E039 Hypothyroidism, unspecified: Secondary | ICD-10-CM | POA: Diagnosis not present

## 2014-09-14 DIAGNOSIS — Z6838 Body mass index (BMI) 38.0-38.9, adult: Secondary | ICD-10-CM | POA: Diagnosis not present

## 2014-09-14 DIAGNOSIS — L03818 Cellulitis of other sites: Secondary | ICD-10-CM | POA: Diagnosis not present

## 2014-10-16 DIAGNOSIS — J309 Allergic rhinitis, unspecified: Secondary | ICD-10-CM | POA: Diagnosis not present

## 2014-10-16 DIAGNOSIS — R062 Wheezing: Secondary | ICD-10-CM | POA: Diagnosis not present

## 2014-10-16 DIAGNOSIS — R05 Cough: Secondary | ICD-10-CM | POA: Diagnosis not present

## 2014-10-25 DIAGNOSIS — I10 Essential (primary) hypertension: Secondary | ICD-10-CM | POA: Diagnosis not present

## 2014-10-25 DIAGNOSIS — E039 Hypothyroidism, unspecified: Secondary | ICD-10-CM | POA: Diagnosis not present

## 2014-10-25 DIAGNOSIS — E782 Mixed hyperlipidemia: Secondary | ICD-10-CM | POA: Diagnosis not present

## 2014-10-25 DIAGNOSIS — R7301 Impaired fasting glucose: Secondary | ICD-10-CM | POA: Diagnosis not present

## 2014-11-05 DIAGNOSIS — E039 Hypothyroidism, unspecified: Secondary | ICD-10-CM | POA: Diagnosis not present

## 2014-11-05 DIAGNOSIS — I10 Essential (primary) hypertension: Secondary | ICD-10-CM | POA: Diagnosis not present

## 2014-11-05 DIAGNOSIS — E6609 Other obesity due to excess calories: Secondary | ICD-10-CM | POA: Diagnosis not present

## 2014-11-05 DIAGNOSIS — R7301 Impaired fasting glucose: Secondary | ICD-10-CM | POA: Diagnosis not present

## 2015-01-14 DIAGNOSIS — L255 Unspecified contact dermatitis due to plants, except food: Secondary | ICD-10-CM | POA: Diagnosis not present

## 2015-01-16 DIAGNOSIS — L255 Unspecified contact dermatitis due to plants, except food: Secondary | ICD-10-CM | POA: Diagnosis not present

## 2015-01-21 DIAGNOSIS — Z91030 Bee allergy status: Secondary | ICD-10-CM | POA: Diagnosis not present

## 2015-04-11 DIAGNOSIS — I10 Essential (primary) hypertension: Secondary | ICD-10-CM | POA: Diagnosis not present

## 2015-04-11 DIAGNOSIS — E039 Hypothyroidism, unspecified: Secondary | ICD-10-CM | POA: Diagnosis not present

## 2015-04-11 DIAGNOSIS — R7301 Impaired fasting glucose: Secondary | ICD-10-CM | POA: Diagnosis not present

## 2015-04-22 DIAGNOSIS — E039 Hypothyroidism, unspecified: Secondary | ICD-10-CM | POA: Diagnosis not present

## 2015-04-22 DIAGNOSIS — R7301 Impaired fasting glucose: Secondary | ICD-10-CM | POA: Diagnosis not present

## 2015-04-22 DIAGNOSIS — I10 Essential (primary) hypertension: Secondary | ICD-10-CM | POA: Diagnosis not present

## 2015-04-22 DIAGNOSIS — L237 Allergic contact dermatitis due to plants, except food: Secondary | ICD-10-CM | POA: Diagnosis not present

## 2015-06-03 DIAGNOSIS — E039 Hypothyroidism, unspecified: Secondary | ICD-10-CM | POA: Diagnosis not present

## 2015-10-22 DIAGNOSIS — E039 Hypothyroidism, unspecified: Secondary | ICD-10-CM | POA: Diagnosis not present

## 2015-10-22 DIAGNOSIS — E782 Mixed hyperlipidemia: Secondary | ICD-10-CM | POA: Diagnosis not present

## 2015-10-22 DIAGNOSIS — R7301 Impaired fasting glucose: Secondary | ICD-10-CM | POA: Diagnosis not present

## 2015-11-02 DIAGNOSIS — E782 Mixed hyperlipidemia: Secondary | ICD-10-CM | POA: Diagnosis not present

## 2015-11-02 DIAGNOSIS — I10 Essential (primary) hypertension: Secondary | ICD-10-CM | POA: Diagnosis not present

## 2015-11-02 DIAGNOSIS — R7301 Impaired fasting glucose: Secondary | ICD-10-CM | POA: Diagnosis not present

## 2015-11-02 DIAGNOSIS — E039 Hypothyroidism, unspecified: Secondary | ICD-10-CM | POA: Diagnosis not present

## 2015-11-05 ENCOUNTER — Other Ambulatory Visit (HOSPITAL_COMMUNITY): Payer: Self-pay | Admitting: Internal Medicine

## 2015-11-05 DIAGNOSIS — Z1231 Encounter for screening mammogram for malignant neoplasm of breast: Secondary | ICD-10-CM

## 2015-11-21 ENCOUNTER — Ambulatory Visit (HOSPITAL_COMMUNITY)
Admission: RE | Admit: 2015-11-21 | Discharge: 2015-11-21 | Disposition: A | Discharge status: Home / Self Care | Payer: Medicare Other | Source: Ambulatory Visit | Attending: Internal Medicine | Admitting: Internal Medicine

## 2015-11-21 DIAGNOSIS — Z1231 Encounter for screening mammogram for malignant neoplasm of breast: Secondary | ICD-10-CM | POA: Diagnosis not present

## 2015-12-10 DIAGNOSIS — E039 Hypothyroidism, unspecified: Secondary | ICD-10-CM | POA: Diagnosis not present

## 2015-12-10 DIAGNOSIS — L292 Pruritus vulvae: Secondary | ICD-10-CM | POA: Diagnosis not present

## 2016-01-30 DIAGNOSIS — E039 Hypothyroidism, unspecified: Secondary | ICD-10-CM | POA: Diagnosis not present

## 2016-02-26 ENCOUNTER — Other Ambulatory Visit (HOSPITAL_COMMUNITY): Payer: Self-pay | Admitting: Internal Medicine

## 2016-02-26 DIAGNOSIS — R7989 Other specified abnormal findings of blood chemistry: Secondary | ICD-10-CM

## 2016-03-02 ENCOUNTER — Ambulatory Visit (HOSPITAL_COMMUNITY)
Admission: RE | Admit: 2016-03-02 | Discharge: 2016-03-02 | Disposition: A | Discharge status: Home / Self Care | Payer: Medicare Other | Source: Ambulatory Visit | Attending: Internal Medicine | Admitting: Internal Medicine

## 2016-03-02 DIAGNOSIS — R7989 Other specified abnormal findings of blood chemistry: Secondary | ICD-10-CM

## 2016-03-02 DIAGNOSIS — R599 Enlarged lymph nodes, unspecified: Secondary | ICD-10-CM | POA: Diagnosis not present

## 2016-03-04 ENCOUNTER — Other Ambulatory Visit: Payer: Self-pay

## 2016-03-06 DIAGNOSIS — N760 Acute vaginitis: Secondary | ICD-10-CM | POA: Diagnosis not present

## 2016-03-06 DIAGNOSIS — E039 Hypothyroidism, unspecified: Secondary | ICD-10-CM | POA: Diagnosis not present

## 2016-05-01 ENCOUNTER — Ambulatory Visit (INDEPENDENT_AMBULATORY_CARE_PROVIDER_SITE_OTHER): Payer: Medicare Other | Admitting: Internal Medicine

## 2016-05-01 ENCOUNTER — Encounter: Payer: Self-pay | Admitting: Internal Medicine

## 2016-05-01 VITALS — BP 122/80 | HR 81 | Ht 63.0 in | Wt 213.0 lb

## 2016-05-01 DIAGNOSIS — E0590 Thyrotoxicosis, unspecified without thyrotoxic crisis or storm: Secondary | ICD-10-CM

## 2016-05-01 NOTE — Patient Instructions (Addendum)
Please stop at the lab.  Please stay off Levothyroxine for now.  Please come back for a follow-up appointment in 3-4 months.   Hyperthyroidism Hyperthyroidism is when the thyroid is too active (overactive). Your thyroid is a large gland that is located in your neck. The thyroid helps to control how your body uses food (metabolism). When your thyroid is overactive, it produces too much of a hormone called thyroxine.  CAUSES Causes of hyperthyroidism may include:  Graves disease. This is when your immune system attacks the thyroid gland. This is the most common cause.  Inflammation of the thyroid gland.  Tumor in the thyroid gland or somewhere else.  Excessive use of thyroid medicines, including:  Prescription thyroid supplement.  Herbal supplements that mimic thyroid hormones.  Solid or fluid-filled lumps within your thyroid gland (thyroid nodules).  Excessive ingestion of iodine. RISK FACTORS  Being female.  Having a family history of thyroid conditions. SIGNS AND SYMPTOMS Signs and symptoms of hyperthyroidism may include:  Nervousness.  Inability to tolerate heat.  Unexplained weight loss.  Diarrhea.  Change in the texture of hair or skin.  Heart skipping beats or making extra beats.  Rapid heart rate.  Loss of menstruation.  Shaky hands.  Fatigue.  Restlessness.  Increased appetite.  Sleep problems.  Enlarged thyroid gland or nodules. DIAGNOSIS  Diagnosis of hyperthyroidism may include:  Medical history and physical exam.  Blood tests.  Ultrasound tests. TREATMENT Treatment may include:  Medicines to control your thyroid.  Surgery to remove your thyroid.  Radiation therapy. HOME CARE INSTRUCTIONS   Take medicines only as directed by your health care provider.  Do not use any tobacco products, including cigarettes, chewing tobacco, or electronic cigarettes. If you need help quitting, ask your health care provider.  Do not exercise  or do physical activity until your health care provider approves.  Keep all follow-up appointments as directed by your health care provider. This is important. SEEK MEDICAL CARE IF:  Your symptoms do not get better with treatment.  You have fever.  You are taking thyroid replacement medicine and you:  Have depression.  Feel mentally and physically slow.  Have weight gain. SEEK IMMEDIATE MEDICAL CARE IF:   You have decreased alertness or a change in your awareness.  You have abdominal pain.  You feel dizzy.  You have a rapid heartbeat.  You have an irregular heartbeat.   This information is not intended to replace advice given to you by your health care provider. Make sure you discuss any questions you have with your health care provider.   Document Released: 06/22/2005 Document Revised: 07/13/2014 Document Reviewed: 11/07/2013 Elsevier Interactive Patient Education [employer].

## 2016-05-01 NOTE — Progress Notes (Signed)
Patient ID: Bethany Cunningham, female   DOB: [birth year], 65 y.o.   MRN: [id number]    HPI  Bethany Cunningham is a 65 y.o.-year-old female, referred by her PCP, Dr. Nevada Crane, for management of hypothyroidism.  Pt. has been dx with hypothyroidism in ~2014 >> on Levothyroxine 112 mcg >> TSH decreased >> dose of LT4 repeatedly decreased >> stopped 4-5 mo ago. Despite this, TSH remained suppressed.  She was initially taking the thyroid hormone: - at 11 pm (bedtime) - dinner at 5:30 - with water or black coffee - no calcium, iron, PPIs, multivitamins   I reviewed pt's thyroid tests per records from PCP: 03/06/2016: TSH 0.07, free T4 1.1 (0.8-1.8) 01/30/2016: TSH 0.11, free T4 0.9  Pt describes: - + weight gain after quitting smoking - no weight loss - + fatigue - + poor sleep - + hot flushes - + anxiety - no depression - no constipation - no dry skin  Pt denies feeling nodules in neck, hoarseness, dysphagia/odynophagia, SOB with lying down.  Patient had a thyroid ultrasound on 03/02/2016 that showed a heterogeneous thyroid, without nodules.  She has + FH of thyroid disorders in: mother and sister (both with Graves ds. - s/p RAI tx). No FH of thyroid cancer.  No h/o radiation tx to head or neck. No recent use of iodine supplements. No Biotin. Occasionally takes Lysine.  Pt. also has a history of HTN, HL, TAH, cholecystectomy.   ROS: Constitutional: + see HPI Eyes: no blurry vision, no xerophthalmia ENT: no sore throat, + see HPI Cardiovascular: no CP/palpitations/leg swelling Respiratory: no cough/+ SOB with exertion Gastrointestinal: no N/V/D/C/+ acid reflux (mild) Musculoskeletal: + muscle/no joint aches Skin: no rashes Neurological: no tremors/numbness/tingling/dizziness Psychiatric: no depression/anxiety  Past Medical History:  Diagnosis Date  . Diverticulosis   . Dyslipidemia   . HTN (hypertension)   . Hypothyroid   . Skin disorder    Past Surgical History:   Procedure Laterality Date  . ABDOMINAL HYSTERECTOMY    . CHOLECYSTECTOMY    . TONSILLECTOMY     Social History   Social History  . Marital status: Single    Spouse name: N/A  . Number of children: 0   Occupational History  . retired Pharmacist, hospital Retired   Social History Main Topics  . Smoking status: former Smoker - quit 2003  . Smokeless tobacco: No       . Alcohol use No  . Drug use: No   Current Outpatient Prescriptions on File Prior to Visit  Medication Sig Dispense Refill  . losartan (COZAAR) 50 MG tablet Take 50 mg by mouth daily.    . polyethylene glycol-electrolytes (TRILYTE) 420 G solution Take 4,000 mLs by mouth as directed. (Patient not taking: Reported on 05/01/2016) 4000 mL 0   No current facility-administered medications on file prior to visit.    No Known Allergies Family History  Problem Relation Age of Onset  . Colon cancer Neg Hx   . Inflammatory bowel disease Neg Hx   . AAA (abdominal aortic aneurysm) Mother    PE: BP 122/80 (BP Location: Left Arm, Patient Position: Sitting)   Pulse 81   Ht 5\' 3"  (1.6 m)   Wt 213 lb (96.6 kg)   SpO2 95%   BMI 37.73 kg/m  Wt Readings from Last 3 Encounters:  05/01/16 213 lb (96.6 kg)  01/09/14 213 lb (96.6 kg)  10/11/13 210 lb (95.3 kg)   Constitutional: Obese, in NAD Eyes: PERRLA, EOMI, no exophthalmos ENT: moist  mucous membranes, no thyromegaly, no thyroid bruits, no cervical lymphadenopathy Cardiovascular: RRR, No MRG Respiratory: CTA B Gastrointestinal: abdomen soft, NT, ND, BS+ Musculoskeletal: no deformities, strength intact in all 4 Skin: moist, warm, no rashes Neurological: no tremor with outstretched hands, DTR normal in all 4  ASSESSMENT: 1. Thyrotoxicosis  2. Hypothyroidism  PLAN:  1. And 2. Patient with a history of hypothyroidism, however, in last year, her TSH started to decrease and remained low despite decreasing the dose of levothyroxine and eventually stopping the medication 4-5 months  ago. Labs obtained 2 months ago showed a suppressed TSH with a normal free T4. - She does not have significant thyrotoxic sxs except hot flashes and fatigue. She denies weight loss, hyperdefecation, tremors, palpitations, anxiety.  - she does not appear to have exogenous causes for the low TSH.  - We discussed that possible causes of thyrotoxicosis are:  Mild Graves ds  (very likely in the presence of significant family history of the same disease) Thyroiditis Toxic multinodular goiter/ toxic adenoma (I cannot feel nodules at palpation of her thyroid and she did not have nodules on her recent thyroid ultrasound). - I suggested that we check the TSH, fT3 and fT4 and also add thyroid stimulating antibodies to screen for Graves' disease.  - If the tests remain abnormal, we may need an uptake and scan to differentiate between the 3 above possible etiologies  - we discussed about possible modalities of treatment for the above conditions, to include methimazole use, radioactive iodine ablation or (last resort) surgery. - I do not feel that we need to add beta blockers at this time, since she is not tachycardic, anxious, or tremulous - I advised her to join my chart to communicate easier - RTC in 4 months, but likely sooner for repeat labs  Component     Latest Ref Rng & Units 05/01/2016  TSH     0.35 - 4.50 uIU/mL 0.05 (L)  T4,Free(Direct)     0.60 - 1.60 ng/dL 0.65  Triiodothyronine,Free,Serum     2.3 - 4.2 pg/mL 4.7 (H)  TSI     <140 % baseline 507 (H)   TSI's are elevated >> new diagnosis of Graves' disease. Free T4 is normal, however, free T3 slightly elevated and TSH is low (T3 thyrotoxicosis). Based on the family history of Graves' disease, her nonnodular thyroid appearance on ultrasound, and the elevated TSI's, we will skip the thyroid uptake and scan. Will start methimazole 5 mg daily and repeat her labs in 1.5 months.  Bethany Kingdom, MD PhD [facility] Endocrinology

## 2016-05-04 LAB — T3, FREE: T3, Free: 4.7 pg/mL — ABNORMAL HIGH (ref 2.3–4.2)

## 2016-05-04 LAB — T4, FREE: Free T4: 0.65 ng/dL (ref 0.60–1.60)

## 2016-05-04 LAB — TSH: TSH: 0.05 u[IU]/mL — ABNORMAL LOW (ref 0.35–4.50)

## 2016-05-05 ENCOUNTER — Other Ambulatory Visit: Payer: Self-pay

## 2016-05-05 ENCOUNTER — Telehealth: Payer: Self-pay

## 2016-05-05 ENCOUNTER — Telehealth: Payer: Self-pay | Admitting: Internal Medicine

## 2016-05-05 DIAGNOSIS — E0500 Thyrotoxicosis with diffuse goiter without thyrotoxic crisis or storm: Secondary | ICD-10-CM | POA: Insufficient documentation

## 2016-05-05 LAB — THYROID STIMULATING IMMUNOGLOBULIN: TSI: 507 % baseline — ABNORMAL HIGH (ref <140)

## 2016-05-05 MED ORDER — METHIMAZOLE 5 MG PO TABS: 5.0000 mg | ORAL_TABLET | Freq: Every day | ORAL | 2 refills | Status: DC

## 2016-05-05 MED ORDER — METHIMAZOLE 5 MG PO TABS
5.0000 mg | ORAL_TABLET | Freq: Every day | ORAL | 2 refills | Status: DC
Start: 2016-05-05 — End: 2016-09-01

## 2016-05-05 NOTE — Telephone Encounter (Signed)
Pt returning your phone call about lab results, she requests call back.

## 2016-05-05 NOTE — Telephone Encounter (Signed)
Called patient and left message, advising of lab results, and new medication sent into pharmacy. Gave call back number to office and advised her to call back to make lab appointment in 1.5 months, and to discuss the side effects of the new medication so she was aware.

## 2016-05-05 NOTE — Telephone Encounter (Signed)
Called patient back, and advised of her questions with lab work, and resubmitted medication. Scheduled patient for lab work, and advised to keep follow up appointment with Dr.Gherghe in February. No other questions at this time.

## 2016-05-05 NOTE — Telephone Encounter (Signed)
See previous note

## 2016-05-07 DIAGNOSIS — I482 Chronic atrial fibrillation: Secondary | ICD-10-CM | POA: Diagnosis not present

## 2016-05-07 DIAGNOSIS — R7301 Impaired fasting glucose: Secondary | ICD-10-CM | POA: Diagnosis not present

## 2016-05-07 DIAGNOSIS — E785 Hyperlipidemia, unspecified: Secondary | ICD-10-CM | POA: Diagnosis not present

## 2016-05-07 DIAGNOSIS — E119 Type 2 diabetes mellitus without complications: Secondary | ICD-10-CM | POA: Diagnosis not present

## 2016-05-07 DIAGNOSIS — I10 Essential (primary) hypertension: Secondary | ICD-10-CM | POA: Diagnosis not present

## 2016-05-07 DIAGNOSIS — E559 Vitamin D deficiency, unspecified: Secondary | ICD-10-CM | POA: Diagnosis not present

## 2016-05-07 DIAGNOSIS — E782 Mixed hyperlipidemia: Secondary | ICD-10-CM | POA: Diagnosis not present

## 2016-05-07 DIAGNOSIS — E039 Hypothyroidism, unspecified: Secondary | ICD-10-CM | POA: Diagnosis not present

## 2016-05-16 DIAGNOSIS — I10 Essential (primary) hypertension: Secondary | ICD-10-CM | POA: Diagnosis not present

## 2016-05-16 DIAGNOSIS — R7301 Impaired fasting glucose: Secondary | ICD-10-CM | POA: Diagnosis not present

## 2016-05-16 DIAGNOSIS — E039 Hypothyroidism, unspecified: Secondary | ICD-10-CM | POA: Diagnosis not present

## 2016-05-16 DIAGNOSIS — Z6838 Body mass index (BMI) 38.0-38.9, adult: Secondary | ICD-10-CM | POA: Diagnosis not present

## 2016-05-16 DIAGNOSIS — E782 Mixed hyperlipidemia: Secondary | ICD-10-CM | POA: Diagnosis not present

## 2016-05-16 DIAGNOSIS — Z0001 Encounter for general adult medical examination with abnormal findings: Secondary | ICD-10-CM | POA: Diagnosis not present

## 2016-07-02 ENCOUNTER — Other Ambulatory Visit (INDEPENDENT_AMBULATORY_CARE_PROVIDER_SITE_OTHER): Payer: Medicare Other

## 2016-07-02 DIAGNOSIS — E0590 Thyrotoxicosis, unspecified without thyrotoxic crisis or storm: Secondary | ICD-10-CM

## 2016-07-02 LAB — TSH: TSH: 10.54 u[IU]/mL — ABNORMAL HIGH (ref 0.35–4.50)

## 2016-07-02 LAB — T3, FREE: T3, Free: 3.6 pg/mL (ref 2.3–4.2)

## 2016-07-02 LAB — T4, FREE: Free T4: 0.43 ng/dL — ABNORMAL LOW (ref 0.60–1.60)

## 2016-07-03 ENCOUNTER — Telehealth: Payer: Self-pay

## 2016-07-03 NOTE — Telephone Encounter (Signed)
Patient was instructed to take her Tapazole 5 mg either every other day or take half a tablet daily per Dr. Cruzita Lederer

## 2016-08-31 ENCOUNTER — Ambulatory Visit (INDEPENDENT_AMBULATORY_CARE_PROVIDER_SITE_OTHER): Payer: Medicare Other | Admitting: Internal Medicine

## 2016-08-31 ENCOUNTER — Encounter: Payer: Self-pay | Admitting: Internal Medicine

## 2016-08-31 VITALS — BP 132/82 | HR 90 | Wt 221.0 lb

## 2016-08-31 DIAGNOSIS — E0590 Thyrotoxicosis, unspecified without thyrotoxic crisis or storm: Secondary | ICD-10-CM

## 2016-08-31 LAB — T4, FREE: Free T4: 0.51 ng/dL — ABNORMAL LOW (ref 0.60–1.60)

## 2016-08-31 LAB — TSH: TSH: 6.64 u[IU]/mL — ABNORMAL HIGH (ref 0.35–4.50)

## 2016-08-31 LAB — T3, FREE: T3, Free: 3.9 pg/mL (ref 2.3–4.2)

## 2016-08-31 NOTE — Progress Notes (Signed)
Patient ID: Bethany Cunningham, female   DOB: [birth year], 66 y.o.   MRN: [id number]    HPI  Bethany Cunningham is a 66 y.o.-year-old female, returning for f/u for Graves ds (previous h/o hypothyroidism). Last visit 4 mo ago.  Reviewed and addended hx: Pt. has been dx with hypothyroidism in ~2014 >> on Levothyroxine 112 mcg >> TSH decreased >> dose of LT4 repeatedly decreased >> stopped 05-12/2015. Despite this, TSH remained suppressed.  03/06/2016: TSH 0.07, free T4 1.1 (0.8-1.8) 01/30/2016: TSH 0.11, free T4 0.9  Patient had a thyroid ultrasound on 03/02/2016 that showed a heterogeneous thyroid, without nodules.  At last visit, TSI Abs were high >> Graves ds: Component     Latest Ref Rng & Units 05/01/2016  TSI     <140 % baseline 507 (H)   We started MMI 5 mg and then decrease the dose to 2.5 mg daily in 06/2016.  Reviewed recent TFTs: Lab Results  Component Value Date   TSH 10.54 (H) 07/02/2016   TSH 0.05 (L) 05/01/2016   FREET4 0.43 (L) 07/02/2016   FREET4 0.65 05/01/2016   T3FREE 3.6 07/02/2016   T3FREE 4.7 (H) 05/01/2016   Pt describes: - + weight gain (initially started after quitting smoking) - no fatigue - + hot flushes - + anxiety - no depression - no constipation - + dry skin  Pt denies feeling nodules in neck, hoarseness, dysphagia/odynophagia, SOB with lying down.  She has + FH of thyroid disorders in: mother and sister (both with Graves ds. - s/p RAI tx). No FH of thyroid cancer.  No h/o radiation tx to head or neck. No recent use of iodine supplements. No Biotin. Occasionally takes Lysine.  Pt. also has a history of HTN, HL, TAH, cholecystectomy.   ROS: Constitutional: + see HPI Eyes: no blurry vision, no xerophthalmia ENT: no sore throat, + see HPI Cardiovascular: no CP/palpitations/leg swelling Respiratory: no cough/+ SOB with exertion Gastrointestinal: no N/V/D/C/acid reflux  Musculoskeletal: no muscle/no joint aches Skin: no  rashes Neurological: no tremors/numbness/tingling/dizziness, + HA  I reviewed pt's medications, allergies, PMH, social hx, family hx, and changes were documented in the history of present illness. Otherwise, unchanged from my initial visit note.  Past Medical History:  Diagnosis Date  . Diverticulosis   . Dyslipidemia   . HTN (hypertension)   . Hypothyroid   . Skin disorder    Past Surgical History:  Procedure Laterality Date  . ABDOMINAL HYSTERECTOMY    . CHOLECYSTECTOMY    . TONSILLECTOMY     Social History   Social History  . Marital status: Single    Spouse name: N/A  . Number of children: 0   Occupational History  . retired Pharmacist, hospital Retired   Social History Main Topics  . Smoking status: former Smoker - quit 2003  . Smokeless tobacco: No       . Alcohol use No  . Drug use: No   Current Outpatient Prescriptions on File Prior to Visit  Medication Sig Dispense Refill  . losartan (COZAAR) 50 MG tablet Take 50 mg by mouth daily.    . methimazole (TAPAZOLE) 5 MG tablet Take 1 tablet (5 mg total) by mouth daily. (Patient taking differently: Take 5 mg by mouth every other day. Will take 1/2 tablet daily) 60 tablet 2   No current facility-administered medications on file prior to visit.    No Known Allergies Family History  Problem Relation Age of Onset  . Colon cancer Neg Hx   .  Inflammatory bowel disease Neg Hx   . AAA (abdominal aortic aneurysm) Mother    PE: BP 132/82 (BP Location: Left Arm, Patient Position: Sitting)   Pulse 90   Wt 221 lb (100.2 kg)   SpO2 97%   BMI 39.15 kg/m  Wt Readings from Last 3 Encounters:  08/31/16 221 lb (100.2 kg)  05/01/16 213 lb (96.6 kg)  01/09/14 213 lb (96.6 kg)   Constitutional: Obese, in NAD Eyes: PERRLA, EOMI, no exophthalmos ENT: moist mucous membranes, no thyromegaly, no cervical lymphadenopathy Cardiovascular: RRR, No MRG Respiratory: CTA B Gastrointestinal: abdomen soft, NT, ND, BS+ Musculoskeletal: no  deformities, strength intact in all 4 Skin: + dry skin, warm Neurological: no tremor with outstretched hands, DTR normal in all 4  ASSESSMENT: 1. Thyrotoxicosis  2. Hypothyroidism  PLAN:  1. And 2. Patient with a history of hypothyroidism, however, in last year, she developed thyrotoxicosis assoc. With hot flashes and fatigue. No weight loss, hyperdefecation, tremors, palpitations, anxiety. At last visit, her TSI Abs were elevated. She also had an thyroid U/S not showing nodules >> most likely Graves ds. (we skipped the Uptake and scan). We started a low dose MMI at last visit >> now decreased to 2.5 mg daily after a subsequent TSH returned high. - will check the TSH, fT3 and fT4 today, on the 2.5 mg MMI daily  - we discussed about possible modalities of treatment for the above conditions, to include methimazole use, radioactive iodine ablation or (last resort) surgery. She is responding very well to low dose MMI >> no need for RAI tx or Sx. - I advised her to join my chart to communicate easier - RTC in 4 months, but likely sooner for repeat labs  Needs refills. Component     Latest Ref Rng & Units 08/31/2016  TSH     0.35 - 4.50 uIU/mL 6.64 (H)  T4,Free(Direct)     0.60 - 1.60 ng/dL 0.51 (L)  Triiodothyronine,Free,Serum     2.3 - 4.2 pg/mL 3.9   Will stop MMI and recheck labs in 5 weeks.  Philemon Kingdom, MD PhD [facility] Endocrinology

## 2016-08-31 NOTE — Patient Instructions (Addendum)
Please continue Methimazole 2.5 mg daily.  Please stop at the lab.  Please come back for a follow-up appointment in 4 months.

## 2016-09-01 ENCOUNTER — Telehealth: Payer: Self-pay

## 2016-09-01 NOTE — Telephone Encounter (Signed)
Called patient and gave lab results. Patient had no questions or concerns.  

## 2016-09-01 NOTE — Telephone Encounter (Signed)
-----   Message from Philemon Kingdom, MD sent at 09/01/2016 12:47 PM EST ----- Bethany Cunningham, can you please call pt:  Good news; we can stop MMI and recheck TFTs in 5 weeks. Labs are in.

## 2016-10-07 ENCOUNTER — Telehealth: Payer: Self-pay

## 2016-10-07 ENCOUNTER — Other Ambulatory Visit (INDEPENDENT_AMBULATORY_CARE_PROVIDER_SITE_OTHER): Payer: Medicare Other

## 2016-10-07 ENCOUNTER — Other Ambulatory Visit: Payer: Self-pay

## 2016-10-07 DIAGNOSIS — E0590 Thyrotoxicosis, unspecified without thyrotoxic crisis or storm: Secondary | ICD-10-CM

## 2016-10-07 LAB — T3, FREE: T3, Free: 4.5 pg/mL — ABNORMAL HIGH (ref 2.3–4.2)

## 2016-10-07 LAB — T4, FREE: Free T4: 0.59 ng/dL — ABNORMAL LOW (ref 0.60–1.60)

## 2016-10-07 LAB — TSH: TSH: 0.93 u[IU]/mL (ref 0.35–4.50)

## 2016-10-07 NOTE — Telephone Encounter (Signed)
Called patient and advised of lab work, scheduled for lab appointment in June. Patient understood and had no questions at this time.

## 2016-10-07 NOTE — Telephone Encounter (Signed)
-----   Message from Philemon Kingdom, MD sent at 10/07/2016 12:22 PM EDT ----- Almyra Free, can you please call pt: TFTs are good >> can stay off [place] but let's repeat a TSH, fT4 and fT3 beginning of June. Can you please order?

## 2016-12-09 ENCOUNTER — Other Ambulatory Visit (INDEPENDENT_AMBULATORY_CARE_PROVIDER_SITE_OTHER): Payer: Medicare Other

## 2016-12-09 DIAGNOSIS — E0590 Thyrotoxicosis, unspecified without thyrotoxic crisis or storm: Secondary | ICD-10-CM | POA: Diagnosis not present

## 2016-12-09 LAB — T3, FREE: T3, Free: 3.5 pg/mL (ref 2.3–4.2)

## 2016-12-09 LAB — T4, FREE: Free T4: 0.55 ng/dL — ABNORMAL LOW (ref 0.60–1.60)

## 2016-12-09 LAB — TSH: TSH: 2.25 u[IU]/mL (ref 0.35–4.50)

## 2016-12-10 ENCOUNTER — Telehealth: Payer: Self-pay

## 2016-12-10 NOTE — Telephone Encounter (Signed)
-----   Message from Philemon Kingdom, MD sent at 12/10/2016 12:33 PM EDT ----- Almyra Free, can you please call pt: TFTs are even better! >> she can continue to stay off [place] and I will repeat her labs when she comes back in July.

## 2016-12-10 NOTE — Telephone Encounter (Signed)
LVM, gave lab results. Gave call back number if any questions or concerns.  

## 2017-01-15 ENCOUNTER — Ambulatory Visit (INDEPENDENT_AMBULATORY_CARE_PROVIDER_SITE_OTHER): Payer: Medicare Other | Admitting: Internal Medicine

## 2017-01-15 ENCOUNTER — Encounter: Payer: Self-pay | Admitting: Internal Medicine

## 2017-01-15 VITALS — BP 130/82 | HR 82 | Wt 224.0 lb

## 2017-01-15 DIAGNOSIS — E0500 Thyrotoxicosis with diffuse goiter without thyrotoxic crisis or storm: Secondary | ICD-10-CM

## 2017-01-15 LAB — T4, FREE: Free T4: 0.49 ng/dL — ABNORMAL LOW (ref 0.60–1.60)

## 2017-01-15 LAB — TSH: TSH: 3.69 u[IU]/mL (ref 0.35–4.50)

## 2017-01-15 LAB — T3, FREE: T3 FREE: 3.9 pg/mL (ref 2.3–4.2)

## 2017-01-15 NOTE — Progress Notes (Signed)
Patient ID: Bethany Cunningham, female   DOB: 29-Jan-1951, 66 y.o.   MRN: [id number]    HPI  Bethany Cunningham is a 66 y.o.-year-old female, returning for f/u for Graves ds (previous h/o hypothyroidism). Last visit 5 mo ago.  Reviewed and addended hx: Pt. has been dx with hypothyroidism in ~2014 >> on Levothyroxine 112 mcg >> TSH decreased >> dose of LT4 repeatedly decreased >> stopped 05-12/2015. Despite this, TSH remained suppressed.  03/06/2016: TSH 0.07, free T4 1.1 (0.8-1.8) 01/30/2016: TSH 0.11, free T4 0.9  Patient had a thyroid ultrasound on 03/02/2016 that showed a heterogeneous thyroid, without nodules.  At last visit, TSI Abs were high >> Graves ds: Component     Latest Ref Rng & Units 05/01/2016  TSI     <140 % baseline 507 (H)   We started MMI 5 mg and then decreased the dose to 2.5 mg daily in 06/2016. She was Subsequently able to come off the methimazole in 08/2016 in her TFTs were normal in 04 and 12/2016.  Reviewed recent TFTs: Lab Results  Component Value Date   TSH 2.25 12/09/2016   TSH 0.93 10/07/2016   TSH 6.64 (H) 08/31/2016   TSH 10.54 (H) 07/02/2016   TSH 0.05 (L) 05/01/2016   FREET4 0.55 (L) 12/09/2016   FREET4 0.59 (L) 10/07/2016   FREET4 0.51 (L) 08/31/2016   FREET4 0.43 (L) 07/02/2016   FREET4 0.65 05/01/2016   T3FREE 3.5 12/09/2016   T3FREE 4.5 (H) 10/07/2016   T3FREE 3.9 08/31/2016   T3FREE 3.6 07/02/2016   T3FREE 4.7 (H) 05/01/2016   Pt denies: - weight gain - heat intolerance - tremors - palpitations - anxiety - hyperdefecation - hair loss  Pt denies: - feeling nodules in neck - hoarseness - dysphagia - choking - SOB with lying down  She has + FH of thyroid disorders in: mother and sister (both with Graves ds. - s/p RAI tx). No FH of thyroid cancer. No h/o radiation tx to head or neck.  No seaweed or kelp. No recent contrast studies. No herbal supplements. No Biotin use. No recent steroids use.   Pt. also has a history of HTN,  HL, TAH, cholecystectomy.   ROS: Constitutional: no weight gain/no weight loss, no fatigue, no subjective hyperthermia, no subjective hypothermia Eyes: no blurry vision, no xerophthalmia ENT: no sore throat, no nodules palpated in throat, no dysphagia, no odynophagia, no hoarseness Cardiovascular: no CP/no SOB/no palpitations/no leg swelling Respiratory: no cough/no SOB/no wheezing Gastrointestinal: no N/no V/no D/no C/+ acid reflux Musculoskeletal: + muscle aches/no joint aches Skin: + rash, no hair loss Neurological: no tremors/no numbness/no tingling/no dizziness  I reviewed pt's medications, allergies, PMH, social hx, family hx, and changes were documented in the history of present illness. Otherwise, unchanged from my initial visit note.  Past Medical History:  Diagnosis Date  . Diverticulosis   . Dyslipidemia   . HTN (hypertension)   . Hypothyroid   . Skin disorder    Past Surgical History:  Procedure Laterality Date  . ABDOMINAL HYSTERECTOMY    . CHOLECYSTECTOMY    . TONSILLECTOMY     Social History   Social History  . Marital status: Single    Spouse name: N/A  . Number of children: 0   Occupational History  . retired Pharmacist, hospital Retired   Social History Main Topics  . Smoking status: former Smoker - quit 2003  . Smokeless tobacco: No       . Alcohol use No  .  Drug use: No   Current Outpatient Prescriptions on File Prior to Visit  Medication Sig Dispense Refill  . losartan (COZAAR) 50 MG tablet Take 50 mg by mouth daily.     No current facility-administered medications on file prior to visit.    No Known Allergies Family History  Problem Relation Age of Onset  . Colon cancer Neg Hx   . Inflammatory bowel disease Neg Hx   . AAA (abdominal aortic aneurysm) Mother     PE: BP 130/82 (BP Location: Left Arm, Patient Position: Sitting)   Pulse 82   Wt 224 lb (101.6 kg)   SpO2 95%   BMI 39.68 kg/m  Wt Readings from Last 3 Encounters:  01/15/17 224 lb  (101.6 kg)  08/31/16 221 lb (100.2 kg)  05/01/16 213 lb (96.6 kg)   Constitutional: overweight, in NAD Eyes: PERRLA, EOMI, no exophthalmos ENT: moist mucous membranes, no thyromegaly, no cervical lymphadenopathy Cardiovascular: RRR, No MRG Respiratory: CTA B Gastrointestinal: abdomen soft, NT, ND, BS+ Musculoskeletal: no deformities, strength intact in all 4 Skin: moist, warm, no rashes Neurological: no tremor with outstretched hands, DTR normal in all 4  ASSESSMENT: 1. Graves ds.  PLAN:  1.  Patient with a history of hypothyroidism, but developing toxic symptoms and labs in the last year. Based on elevated TSI antibodies and the absence of nodules on the thyroid ultrasound diagnosed Graves' disease and skip the uptake and scan. She did well on methimazole, in fact, we were able to stop this in 08/2016.  - She does not have any hyperthyroid symptoms at this visit - No neck compression symptoms - will check the TSH, fT3 and fT4 today >> we discussed that we may need to restart levothyroxine in the near future - I again I advised her to join my chart to communicate easier - I will see her back in 6 months  Component     Latest Ref Rng & Units 01/15/2017  TSH     0.35 - 4.50 uIU/mL 3.69  T4,Free(Direct)     0.60 - 1.60 ng/dL 0.49 (L)  Triiodothyronine,Free,Serum     2.3 - 4.2 pg/mL 3.9   Her TSH is trending up and free T4 is trending down. At this point, I would suggest to start a low-dose levothyroxine 25 g daily and have her back for labs in 2 months.  Philemon Kingdom, MD PhD [facility] Endocrinology

## 2017-01-15 NOTE — Patient Instructions (Signed)
Please continue to stay off Methimazole.  Please stop at the lab.  Please come back for a follow-up appointment in 6 months.

## 2017-01-18 ENCOUNTER — Telehealth: Payer: Self-pay

## 2017-01-18 MED ORDER — LEVOTHYROXINE SODIUM 25 MCG PO TABS: 25.0000 ug | ORAL_TABLET | Freq: Every day | ORAL | 1 refills | Status: DC

## 2017-01-18 NOTE — Telephone Encounter (Signed)
-----   Message from Philemon Kingdom, MD sent at 01/15/2017  6:02 PM EDT ----- Almyra Free, can you please call pt: Her TSH is trending up and free T4 is trending down. At this point, I would suggest to start a low-dose levothyroxine 25 g daily (would she agree with this?) and have her back for labs in 2 months: TSH, free t3 and fT4 - can you please order?

## 2017-01-18 NOTE — Telephone Encounter (Signed)
Called patient and gave lab results. Patient had no questions or concerns. Scheduled patient for lab appointment, submitted RX.

## 2017-03-24 ENCOUNTER — Other Ambulatory Visit (INDEPENDENT_AMBULATORY_CARE_PROVIDER_SITE_OTHER): Payer: Medicare Other

## 2017-03-24 ENCOUNTER — Telehealth: Payer: Self-pay

## 2017-03-24 ENCOUNTER — Other Ambulatory Visit: Payer: Self-pay

## 2017-03-24 DIAGNOSIS — E0500 Thyrotoxicosis with diffuse goiter without thyrotoxic crisis or storm: Secondary | ICD-10-CM | POA: Diagnosis not present

## 2017-03-24 LAB — TSH: TSH: 0.55 u[IU]/mL (ref 0.35–4.50)

## 2017-03-24 LAB — T4, FREE: Free T4: 0.76 ng/dL (ref 0.60–1.60)

## 2017-03-24 LAB — T3, FREE: T3 FREE: 3.7 pg/mL (ref 2.3–4.2)

## 2017-03-24 NOTE — Telephone Encounter (Signed)
Called patient and advised of lab results. No questions at this time.

## 2017-03-24 NOTE — Telephone Encounter (Signed)
-----   Message from Philemon Kingdom, MD sent at 03/24/2017 12:17 PM EDT ----- Almyra Free, can you please call pt: TFTs now all normal >> continue current LT4 dose (25 mcg)

## 2017-06-21 ENCOUNTER — Telehealth: Payer: Self-pay | Admitting: Internal Medicine

## 2017-06-21 MED ORDER — LEVOTHYROXINE SODIUM 25 MCG PO TABS: 25.0000 ug | ORAL_TABLET | Freq: Every day | ORAL | 0 refills | Status: DC

## 2017-06-21 NOTE — Telephone Encounter (Signed)
°  Pt comes in on jan 14th and the medication runs out on the 13th  Patient wants to know if she can get a 30 day script sent in so she will have it    levothyroxine (SYNTHROID, LEVOTHROID) 25 MCG tablet

## 2017-06-21 NOTE — Telephone Encounter (Signed)
Sent to pharmacy 

## 2017-07-11 ENCOUNTER — Other Ambulatory Visit: Payer: Self-pay | Admitting: Internal Medicine

## 2017-07-14 ENCOUNTER — Telehealth: Payer: Self-pay | Admitting: Internal Medicine

## 2017-07-14 NOTE — Telephone Encounter (Signed)
Sent in a 90 day supply on 06/11/17 to early for a refill. Pt is aware and is calling her pharamacy

## 2017-07-14 NOTE — Telephone Encounter (Signed)
Need refill of  Original Order:  levothyroxine (SYNTHROID, LEVOTHROID) 25 MCG tablet [[id number]]    Pharmacy:  [id number] - [place], [place] - [address] DEA #:  [id number]      [phone] (Phone) [phone] (Fax)

## 2017-07-19 ENCOUNTER — Ambulatory Visit: Payer: Medicare Other | Admitting: Internal Medicine

## 2017-07-20 ENCOUNTER — Ambulatory Visit: Payer: Medicare Other | Admitting: Internal Medicine

## 2017-09-09 ENCOUNTER — Ambulatory Visit: Payer: Medicare Other | Admitting: Internal Medicine

## 2017-09-09 ENCOUNTER — Encounter: Payer: Self-pay | Admitting: Internal Medicine

## 2017-09-09 VITALS — BP 136/74 | HR 99 | Ht 63.0 in | Wt 233.4 lb

## 2017-09-09 DIAGNOSIS — E0500 Thyrotoxicosis with diffuse goiter without thyrotoxic crisis or storm: Secondary | ICD-10-CM | POA: Diagnosis not present

## 2017-09-09 DIAGNOSIS — E039 Hypothyroidism, unspecified: Secondary | ICD-10-CM | POA: Diagnosis not present

## 2017-09-09 NOTE — Patient Instructions (Addendum)
Please continue: - Levothyroxine 25 mcg daily  Take this around 6:30-7 am.  You can eat b'fast >30 min later.  Move Protonix and Zantac to lunchtime or later.  Please call and schedule an eye appt with Dr. Prudencio Burly: [facility]:  Dr. Sherlyn Lick MD ?  Address: [address], [place], [place] [address]  [phone]  Please return in 6 months for a visit but in 5-6 weeks for labwork.

## 2017-09-09 NOTE — Progress Notes (Signed)
Patient ID: Bethany Cunningham, female   DOB: 12-04-1950, 67 y.o.   MRN: [id number]    HPI  Bethany Cunningham is a 67 y.o.-year-old female, returning for f/u for Graves ds (previous h/o hypothyroidism). Last visit 8 months ago.  Reviewed and addended history:: Pt. has been dx with hypothyroidism in ~2014 >> on Levothyroxine 112 mcg >> TSH decreased >> dose of LT4 repeatedly decreased >> stopped 05-12/2015. Despite this, TSH remained suppressed.  03/06/2016: TSH 0.07, free T4 1.1 (0.8-1.8) 01/30/2016: TSH 0.11, free T4 0.9  Patient had a thyroid ultrasound on 03/02/2016 that showed a heterogeneous thyroid, without nodules.  Her thyroid antibodies were high: Lab Results  Component Value Date   TSI 507 (H) 05/01/2016   We initially started methimazole 5 mg daily and decrease the dose to 2.5 mg daily in 06/2016.  She was able to come off methimazole completely in 08/2016.    However, at last visit, TSH was higher in the normal range and free T4 was low.  Therefore, I suggested to start the low-dose levothyroxine, 25 mcg daily.  Subsequent tests have been normal.  Pt is on levothyroxine 25 mcg daily, taken: - at 4 am - fasting - >2h before b'fast - no Ca, Fe, MVI - + PPI (pantoprazole) 3-4h after - + Famotidine 4-5h before LT4 (!) - not on Biotin  Reviewed recent TFTs: Lab Results  Component Value Date   TSH 0.55 03/24/2017   TSH 3.69 01/15/2017   TSH 2.25 12/09/2016   TSH 0.93 10/07/2016   TSH 6.64 (H) 08/31/2016   TSH 10.54 (H) 07/02/2016   TSH 0.05 (L) 05/01/2016   FREET4 0.76 03/24/2017   FREET4 0.49 (L) 01/15/2017   FREET4 0.55 (L) 12/09/2016   FREET4 0.59 (L) 10/07/2016   FREET4 0.51 (L) 08/31/2016   FREET4 0.43 (L) 07/02/2016   FREET4 0.65 05/01/2016   T3FREE 3.7 03/24/2017   T3FREE 3.9 01/15/2017   T3FREE 3.5 12/09/2016   T3FREE 4.5 (H) 10/07/2016   T3FREE 3.9 08/31/2016   T3FREE 3.6 07/02/2016   T3FREE 4.7 (H) 05/01/2016   Pt denies: - feeling nodules in  neck - hoarseness - dysphagia - choking - SOB with lying down  She has + FH of thyroid disorders in: mother and sister (both with Graves ds. - s/p RAI tx). No FH of thyroid cancer. No h/o radiation tx to head or neck.  No seaweed or kelp. No recent contrast studies. No herbal supplements. No Biotin use. No recent steroids use.   Pt. also has a history of HTN, HL, TAH, cholecystectomy.   ROS: Constitutional: + weight gain/no weight loss, no fatigue, no subjective hyperthermia, no subjective hypothermia, + nocturia Eyes: no blurry vision, no xerophthalmia ENT: no sore throat, + see HPI Cardiovascular: no CP/no SOB/no palpitations/no leg swelling Respiratory: no cough/no SOB/no wheezing Gastrointestinal: + N/+ V/no D/no C/+ acid reflux Musculoskeletal: no muscle aches/no joint aches Skin: no rashes, no hair loss Neurological: no tremors/no numbness/no tingling/no dizziness  I reviewed pt's medications, allergies, PMH, social hx, family hx, and changes were documented in the history of present illness. Otherwise, unchanged from my initial visit note.  Past Medical History:  Diagnosis Date  . Diverticulosis   . Dyslipidemia   . HTN (hypertension)   . Hypothyroid   . Skin disorder    Past Surgical History:  Procedure Laterality Date  . ABDOMINAL HYSTERECTOMY    . CHOLECYSTECTOMY    . TONSILLECTOMY     Social History  Social History  . Marital status: Single    Spouse name: N/A  . Number of children: 0   Occupational History  . retired Pharmacist, hospital Retired   Social History Main Topics  . Smoking status: former Smoker - quit 2003  . Smokeless tobacco: No       . Alcohol use No  . Drug use: No   Current Outpatient Medications on File Prior to Visit  Medication Sig Dispense Refill  . levothyroxine (SYNTHROID, LEVOTHROID) 25 MCG tablet TAKE 1 TABLET (25 MCG TOTAL) BY MOUTH DAILY BEFORE BREAKFAST. 90 tablet 0  . losartan (COZAAR) 50 MG tablet Take 50 mg by mouth daily.      No current facility-administered medications on file prior to visit.    No Known Allergies Family History  Problem Relation Age of Onset  . Colon cancer Neg Hx   . Inflammatory bowel disease Neg Hx   . AAA (abdominal aortic aneurysm) Mother     PE: BP 136/74 (BP Location: Left Arm, Patient Position: Sitting, Cuff Size: Large)   Pulse 99   Ht 5\' 3"  (1.6 m)   Wt 233 lb 6.4 oz (105.9 kg)   SpO2 98%   BMI 41.34 kg/m  Wt Readings from Last 3 Encounters:  09/09/17 233 lb 6.4 oz (105.9 kg)  01/15/17 224 lb (101.6 kg)  08/31/16 221 lb (100.2 kg)   Constitutional: obese, in NAD Eyes: PERRLA, EOMI, no exophthalmos ENT: moist mucous membranes, no thyromegaly, no cervical lymphadenopathy Cardiovascular: tachycardia, RR, No MRG Respiratory: CTA B Gastrointestinal: abdomen soft, NT, ND, BS+ Musculoskeletal: no deformities, strength intact in all 4 Skin: moist, warm, no rashes Neurological: no tremor with outstretched hands, DTR normal in all 4  ASSESSMENT: 1. Graves ds.  PLAN:  1.  Patient with a history of hypothyroidism, followed by thyroid toxicity, confirmed as Graves' disease based on elevated TSI antibodies, absence of nodules on the thyroid ultrasound and protracted course.  We started methimazole and she did well on this, allowing [place] to discontinue the medication in 08/2016.  Her tests trended towards hypothyroidism again, therefore, we started levothyroxine low dose at last visit.  We discussed about the fact that she will most likely need to be on higher doses of levothyroxine in the near future. - thyroid labs reviewed with pt >> normal, but with a low free T4 in 01/2017, after which we started levothyroxine and they normalized in 03/2017 - she continues on LT4 25 mcg daily - pt feels good on this dose. - we discussed about taking the thyroid hormone every day, with water, >30 minutes before breakfast, separated by >4 hours from acid reflux medications, calcium, iron,  multivitamins. Pt. is not taking it correctly.  She takes Zantac as needed few hours before levothyroxine, which she takes in the middle of the night.  She usually wakes up around 2 AM to see her grocery and this takes about 45 minutes.  She takes levothyroxine afterwards.  She eats approximately 2 hours afterwards, when she also takes Protonix.  We discussed to move levothyroxine at 6-7 the morning, she can eat breakfast at least 30 minutes later, and move in dialysis and medications at lunchtime or afterwards. - will check thyroid tests in 5-6 weeks after she starts taking the medication correct: TSH and fT4 - If labs are abnormal, she will need to return for repeat TFTs in 1.5 months - Otherwise, I will see her back in 6 months  Orders Placed This Encounter  Procedures  .  TSH  . T4, free   - time spent with the patient: 30 min, of which >50% was spent in obtaining information about her symptoms, reviewing her previous labs, evaluations, and treatments, counseling her about her condition correct medication intake (please see the discussed topics above), and developing a plan to further investigate and treat it.   Philemon Kingdom, MD PhD [facility] Endocrinology

## 2017-10-21 ENCOUNTER — Other Ambulatory Visit (INDEPENDENT_AMBULATORY_CARE_PROVIDER_SITE_OTHER): Payer: Medicare Other

## 2017-10-21 ENCOUNTER — Telehealth: Payer: Self-pay

## 2017-10-21 DIAGNOSIS — E0500 Thyrotoxicosis with diffuse goiter without thyrotoxic crisis or storm: Secondary | ICD-10-CM | POA: Diagnosis not present

## 2017-10-21 LAB — T4, FREE: FREE T4: 0.63 ng/dL (ref 0.60–1.60)

## 2017-10-21 LAB — TSH: TSH: 0.32 u[IU]/mL — AB (ref 0.35–4.50)

## 2017-10-21 NOTE — Telephone Encounter (Signed)
Called pt. No answer °

## 2017-10-21 NOTE — Telephone Encounter (Signed)
-----   Message from Philemon Kingdom, MD sent at 10/21/2017 11:19 AM EDT ----- Larey Seat, can you please call pt: Her TSH is slightly low on the current dose of levothyroxine.  Please advise her to start taking 25 mcg every other day and come back for labs in 1.5 months.  Can you please order these: TSH and free T4?

## 2017-10-23 ENCOUNTER — Other Ambulatory Visit: Payer: Self-pay | Admitting: Internal Medicine

## 2017-10-25 NOTE — Telephone Encounter (Signed)
Called pt. No answer °

## 2017-10-27 ENCOUNTER — Other Ambulatory Visit: Payer: Self-pay

## 2017-10-27 DIAGNOSIS — E039 Hypothyroidism, unspecified: Secondary | ICD-10-CM

## 2017-11-03 ENCOUNTER — Encounter (HOSPITAL_COMMUNITY): Payer: Self-pay | Admitting: Emergency Medicine

## 2017-11-03 ENCOUNTER — Emergency Department (HOSPITAL_COMMUNITY)
Admission: EM | Admit: 2017-11-03 | Discharge: 2017-11-03 | Disposition: A | Discharge status: Home / Self Care | Payer: Medicare Other | Attending: Emergency Medicine | Admitting: Emergency Medicine

## 2017-11-03 ENCOUNTER — Other Ambulatory Visit: Payer: Self-pay

## 2017-11-03 ENCOUNTER — Emergency Department (HOSPITAL_COMMUNITY): Payer: Medicare Other

## 2017-11-03 DIAGNOSIS — Z79899 Other long term (current) drug therapy: Secondary | ICD-10-CM | POA: Diagnosis not present

## 2017-11-03 DIAGNOSIS — Z87891 Personal history of nicotine dependence: Secondary | ICD-10-CM | POA: Diagnosis not present

## 2017-11-03 DIAGNOSIS — E039 Hypothyroidism, unspecified: Secondary | ICD-10-CM | POA: Insufficient documentation

## 2017-11-03 DIAGNOSIS — Y999 Unspecified external cause status: Secondary | ICD-10-CM | POA: Diagnosis not present

## 2017-11-03 DIAGNOSIS — Y9389 Activity, other specified: Secondary | ICD-10-CM | POA: Diagnosis not present

## 2017-11-03 DIAGNOSIS — W230XXA Caught, crushed, jammed, or pinched between moving objects, initial encounter: Secondary | ICD-10-CM | POA: Insufficient documentation

## 2017-11-03 DIAGNOSIS — I10 Essential (primary) hypertension: Secondary | ICD-10-CM | POA: Diagnosis not present

## 2017-11-03 DIAGNOSIS — S62634B Displaced fracture of distal phalanx of right ring finger, initial encounter for open fracture: Secondary | ICD-10-CM

## 2017-11-03 DIAGNOSIS — S67195A Crushing injury of left ring finger, initial encounter: Secondary | ICD-10-CM | POA: Diagnosis present

## 2017-11-03 DIAGNOSIS — Y929 Unspecified place or not applicable: Secondary | ICD-10-CM | POA: Diagnosis not present

## 2017-11-03 DIAGNOSIS — S6710XA Crushing injury of unspecified finger(s), initial encounter: Secondary | ICD-10-CM

## 2017-11-03 DIAGNOSIS — Z23 Encounter for immunization: Secondary | ICD-10-CM | POA: Insufficient documentation

## 2017-11-03 MED ORDER — POVIDONE-IODINE 10 % EX SOLN
CUTANEOUS | Status: AC
  Administered 2017-11-03: 19:00:00
  Filled 2017-11-03: qty 15

## 2017-11-03 MED ORDER — CEPHALEXIN 500 MG PO CAPS
500.0000 mg | ORAL_CAPSULE | Freq: Once | ORAL | Status: AC
  Administered 2017-11-03: 500 mg via ORAL
  Filled 2017-11-03: qty 1

## 2017-11-03 MED ORDER — TETANUS-DIPHTH-ACELL PERTUSSIS 5-2.5-18.5 LF-MCG/0.5 IM SUSP
0.5000 mL | Freq: Once | INTRAMUSCULAR | Status: AC
  Administered 2017-11-03: 0.5 mL via INTRAMUSCULAR
  Filled 2017-11-03: qty 0.5

## 2017-11-03 MED ORDER — LIDOCAINE HCL (PF) 2 % IJ SOLN: 10.0000 mL | Freq: Once | INTRAMUSCULAR | Status: DC

## 2017-11-03 MED ORDER — LIDOCAINE HCL (PF) 2 % IJ SOLN
INTRAMUSCULAR | Status: AC
  Filled 2017-11-03: qty 10

## 2017-11-03 MED ORDER — CEPHALEXIN 500 MG PO CAPS: 500.0000 mg | ORAL_CAPSULE | Freq: Four times a day (QID) | ORAL | 0 refills | Status: DC

## 2017-11-03 NOTE — Discharge Instructions (Addendum)
Keep your finger clean and dry, and apply ice and use elevation as much as is comfortable to help reduce swelling in your finger.  Take your next dose of the antibiotic tomorrow morning. Call Dr. Grandville Silos as discussed for further management of your injury until this bone and laceration is healed.

## 2017-11-03 NOTE — ED Triage Notes (Signed)
Pt reports picking up a wooden chair when it smashed her R ring finger. Unknown last tetanus shot. Denies blood thinner use.

## 2017-11-03 NOTE — ED Provider Notes (Addendum)
[facility] EMERGENCY DEPARTMENT Provider Note   CSN: [id number] Arrival date & time: 11/03/17  1800     History   Chief Complaint Chief Complaint  Patient presents with  . Finger Injury    HPI Bethany Cunningham is a 67 y.o. female, right handed, presenting with acute crush injury to her right ring finger.  She was trying to assemble a wooden yard chair when she caught it between 2 hinged sections, causing pain and laceration.  The injury occurred around noon today. She has keep the wound covered.  Upon contacting her pcp, who returned her call just prior to 5 pm, she was directed here.  She has no other injury and denies numbness in the finger tip.  She does not know her tetanus status.  The history is provided by the patient.    Past Medical History:  Diagnosis Date  . Diverticulosis   . Dyslipidemia   . HTN (hypertension)   . Hypothyroid   . Skin disorder     Patient Active Problem List   Diagnosis Date Noted  . Graves disease 05/05/2016  . Rectal bleeding 06/19/2013  . Abnormal LFTs 06/19/2013  . Hypothyroidism (acquired)   . HTN (hypertension)   . Dyslipidemia   . Skin disorder     Past Surgical History:  Procedure Laterality Date  . ABDOMINAL HYSTERECTOMY    . CHOLECYSTECTOMY    . TONSILLECTOMY       OB History   None      Home Medications    Prior to Admission medications   Medication Sig Start Date End Date Taking? Authorizing Provider  cephALEXin (KEFLEX) 500 MG capsule Take 1 capsule (500 mg total) by mouth 4 (four) times daily. 11/03/17   Evalee Jefferson, PA-C  famotidine (PEPCID) 40 MG tablet Take 1 tablet by mouth as needed. 08/21/17   [provider]  levothyroxine (SYNTHROID, LEVOTHROID) 25 MCG tablet TAKE 1 TABLET (25 MCG TOTAL) BY MOUTH DAILY BEFORE BREAKFAST. 10/25/17   Philemon Kingdom, MD  losartan (COZAAR) 50 MG tablet Take 50 mg by mouth daily.    [provider]  pantoprazole (PROTONIX) 40 MG tablet Take 40 mg by mouth  daily. 08/21/17   [provider]    Family History Family History  Problem Relation Age of Onset  . AAA (abdominal aortic aneurysm) Mother   . Colon cancer Neg Hx   . Inflammatory bowel disease Neg Hx     Social History Social History   Tobacco Use  . Smoking status: Former Smoker    Last attempt to quit: 07/06/2001    Years since quitting: 16.3  . Smokeless tobacco: Never Used  . Tobacco comment: Former smoker/ quit x 8 years  Substance Use Topics  . Alcohol use: No  . Drug use: No     Allergies   Patient has no known allergies.   Review of Systems Review of Systems  Constitutional: Negative for fever.  Musculoskeletal: Positive for arthralgias. Negative for joint swelling and myalgias.  Skin: Positive for wound.  Neurological: Negative for weakness and numbness.     Physical Exam Updated Vital Signs BP (!) 133/59 (BP Location: Right Arm)   Pulse 94   Temp 97.7 F (36.5 C) (Oral)   Resp 18   Ht 5\' 2"  (1.575 m)   Wt 99.8 kg (220 lb)   SpO2 94%   BMI 40.24 kg/m   Physical Exam  Constitutional: She is oriented to person, place, and time. She appears  well-developed and well-nourished.  HENT:  Head: Normocephalic.  Cardiovascular: Normal rate.  Pulmonary/Chest: Effort normal.  Musculoskeletal: She exhibits edema and tenderness.  ttp with 1 cm laceration distal left ring finger, finger tuft. Hemostatic, subcutaneous.  Neurological: She is alert and oriented to person, place, and time. No sensory deficit.  Sensation intact distal left ring finger.  Skin: Laceration noted.     ED Treatments / Results  Labs (all labs ordered are listed, but only abnormal results are displayed) Labs Reviewed - No data to display  EKG None  Radiology Dg Finger Ring Right  Result Date: 11/03/2017 CLINICAL DATA:  Crush injury to finger today. Finger pain and bruising. Initial encounter. EXAM: RIGHT RING FINGER 2+V COMPARISON:  None. FINDINGS: Displaced fracture  through the terminal tuft of the distal phalanx is seen. No evidence of dislocation. IMPRESSION: Displaced fracture through terminal tuft of distal phalanx. Electronically Signed   By: Earle Gell M.D.   On: 11/03/2017 18:53    Procedures Procedures (including critical care time)  LACERATION REPAIR Performed by: Evalee Jefferson Authorized by: Evalee Jefferson Consent: Verbal consent obtained. Risks and benefits: risks, benefits and alternatives were discussed Consent given by: patient Patient identity confirmed: provided demographic data Prepped and Draped in normal sterile fashion Wound explored  Laceration Location: left ring finger  Laceration Length: 1cm  No Foreign Bodies seen or palpated  Anesthesia: digital block  Local anesthetic: lidocaine 2% without epinephrine  Anesthetic total: 2 ml  Irrigation method: syringe Amount of cleaning: copious using betadine, followed by saline and 2x2 scrub then flushing with 200 cc of NS using syringe.  Skin closure: ethilon 4-0  Number of sutures: 3, loosely  Technique: simple interupted  Patient tolerance: Patient tolerated the procedure well with no immediate complications.   Medications Ordered in ED Medications  lidocaine (XYLOCAINE) 2 % injection 10 mL (has no administration in time range)  povidone-iodine (BETADINE) 10 % external solution (  Given 11/03/17 1900)  Tdap (BOOSTRIX) injection 0.5 mL (0.5 mLs Intramuscular Given 11/03/17 2023)  cephALEXin (KEFLEX) capsule 500 mg (500 mg Oral Given 11/03/17 2028)     Initial Impression / Assessment and Plan / ED Course  I have reviewed the triage vital signs and the nursing notes.  Pertinent labs & imaging results that were available during my care of the patient were reviewed by me and considered in my medical decision making (see chart for details).     Pt with open tuft fracture left right finger.  Wound approximated and sutured loosely after copious cleaning/irrigation.  Keflex,  discussed ice,  Elevation for pain,swelling. Dressing applied and moldable finger splint to protect the distal tuft.  Splint was examined post application, pain improved. Tetanus updated, referral for f/u given. Return precautions discussed.   Final Clinical Impressions(s) / ED Diagnoses   Final diagnoses:  Crushing injury of finger, initial encounter  Open displaced fracture of distal phalanx of right ring finger, initial encounter    ED Discharge Orders        Ordered    cephALEXin (KEFLEX) 500 MG capsule  4 times daily     11/03/17 2029       Evalee Jefferson, PA-C 11/04/17 0007    Evalee Jefferson, PA-C 11/04/17 N[place]ta Lights    Virgel Manifold, MD 11/04/17 2249

## 2017-11-05 NOTE — Telephone Encounter (Signed)
Spoke to patient. She stated she was able to get lab results and medication instructions. Pt verbalized understanding.

## 2017-12-14 ENCOUNTER — Telehealth: Payer: Self-pay | Admitting: Cardiology

## 2017-12-14 ENCOUNTER — Ambulatory Visit: Payer: Medicare Other | Admitting: Cardiology

## 2017-12-14 ENCOUNTER — Encounter: Payer: Self-pay | Admitting: Cardiology

## 2017-12-14 VITALS — BP 128/78 | HR 76 | Ht 62.0 in | Wt 232.2 lb

## 2017-12-14 DIAGNOSIS — R0789 Other chest pain: Secondary | ICD-10-CM | POA: Diagnosis not present

## 2017-12-14 DIAGNOSIS — R011 Cardiac murmur, unspecified: Secondary | ICD-10-CM

## 2017-12-14 DIAGNOSIS — R0609 Other forms of dyspnea: Secondary | ICD-10-CM

## 2017-12-14 NOTE — Progress Notes (Signed)
Clinical Summary Bethany Cunningham is a 67 y.o.female seen as new consult, referred by Dr Nevada Crane for chest pressure and DOE.   1. Chest pressure/DOE - pressure started 4-5 months ago. Midchest, mainly with activities. 5/10 in severity. +SOB. Can have some nausea at times. Pressure lasts few seconds to minutes. Pressure has decreased some since onset, occurs <1-2 times per week. Not positional.  - 30-35 minutes jogging in place without troubles - symptoms with walking 150 yards or more, previously could do without troubles.   CAD risk factors: HTN, prediabetes, former tobacco x 30     Past Medical History:  Diagnosis Date  . Diverticulosis   . Dyslipidemia   . HTN (hypertension)   . Hypothyroid   . Skin disorder      No Known Allergies   Current Outpatient Medications  Medication Sig Dispense Refill  . cephALEXin (KEFLEX) 500 MG capsule Take 1 capsule (500 mg total) by mouth 4 (four) times daily. 40 capsule 0  . famotidine (PEPCID) 40 MG tablet Take 1 tablet by mouth as needed.  0  . levothyroxine (SYNTHROID, LEVOTHROID) 25 MCG tablet TAKE 1 TABLET (25 MCG TOTAL) BY MOUTH DAILY BEFORE BREAKFAST. 90 tablet 0  . losartan (COZAAR) 50 MG tablet Take 50 mg by mouth daily.    . pantoprazole (PROTONIX) 40 MG tablet Take 40 mg by mouth daily.  0   No current facility-administered medications for this visit.      Past Surgical History:  Procedure Laterality Date  . ABDOMINAL HYSTERECTOMY    . CHOLECYSTECTOMY    . TONSILLECTOMY       No Known Allergies    Family History  Problem Relation Age of Onset  . AAA (abdominal aortic aneurysm) Mother   . Colon cancer Neg Hx   . Inflammatory bowel disease Neg Hx      Social History Bethany Cunningham reports that she quit smoking about 16 years ago. She has never used smokeless tobacco. Bethany Cunningham reports that she does not drink alcohol.   Review of Systems CONSTITUTIONAL: No weight loss, fever, chills, weakness or  fatigue.  HEENT: Eyes: No visual loss, blurred vision, double vision or yellow sclerae.No hearing loss, sneezing, congestion, runny nose or sore throat.  SKIN: No rash or itching.  CARDIOVASCULAR: per hpi RESPIRATORY: per hpi.  GASTROINTESTINAL: No anorexia, nausea, vomiting or diarrhea. No abdominal pain or blood.  GENITOURINARY: No burning on urination, no polyuria NEUROLOGICAL: No headache, dizziness, syncope, paralysis, ataxia, numbness or tingling in the extremities. No change in bowel or bladder control.  MUSCULOSKELETAL: No muscle, back pain, joint pain or stiffness.  LYMPHATICS: No enlarged nodes. No history of splenectomy.  PSYCHIATRIC: No history of depression or anxiety.  ENDOCRINOLOGIC: No reports of sweating, cold or heat intolerance. No polyuria or polydipsia.  Marland Kitchen   Physical Examination Vitals:   12/14/17 0819 12/14/17 0822  BP: (!) 143/78 128/78  Pulse:  76   Vitals:   12/14/17 0819  Weight: 232 lb 3.2 oz (105.3 kg)  Height: 5\' 2"  (1.575 m)    Gen: resting comfortably, no acute distress HEENT: no scleral icterus, pupils equal round and reactive, no palptable cervical adenopathy,  CV: RRR, 2/6 systolic murmur rusb, no jvd Resp: Clear to auscultation bilaterally GI: abdomen is soft, non-tender, non-distended, normal bowel sounds, no hepatosplenomegaly MSK: extremities are warm, no edema.  Skin: warm, no rash Neuro:  no focal deficits Psych: appropriate affect     Assessment and Plan  1.  Chest pressure/DOE - due to recent symptoms we will first obtain an echocardiogram, pending results consider stress testing - EKG today shows SR, lateral ST/T changes unknown chronicity. Will require stress imaging given abnormal baseline EKG.   2. Heart murmur - obtain echo   F/u pending test results. Likely obtain stress test pending echo results.     Arnoldo Lenis, M.D.

## 2017-12-14 NOTE — Telephone Encounter (Signed)
Echo scheduled in [facility] January 19, 2018

## 2017-12-14 NOTE — Patient Instructions (Signed)
Your physician recommends that you schedule a follow-up appointment in: TO [place]  Your physician recommends that you continue on your current medications as directed. Please refer to the Current Medication list given to you today.  Your physician has requested that you have an echocardiogram. Echocardiography is a painless test that uses sound waves to create images of your heart. It provides your doctor with information about the size and shape of your heart and how well your heart's chambers and valves are working. This procedure takes approximately one hour. There are no restrictions for this procedure.  Thank you for choosing [facility]!!

## 2017-12-15 ENCOUNTER — Telehealth: Payer: Self-pay

## 2017-12-15 ENCOUNTER — Telehealth: Payer: Self-pay | Admitting: Internal Medicine

## 2017-12-15 ENCOUNTER — Other Ambulatory Visit (INDEPENDENT_AMBULATORY_CARE_PROVIDER_SITE_OTHER): Payer: Medicare Other

## 2017-12-15 DIAGNOSIS — E039 Hypothyroidism, unspecified: Secondary | ICD-10-CM | POA: Diagnosis not present

## 2017-12-15 DIAGNOSIS — E0590 Thyrotoxicosis, unspecified without thyrotoxic crisis or storm: Secondary | ICD-10-CM

## 2017-12-15 LAB — TSH: TSH: 0.25 u[IU]/mL — ABNORMAL LOW (ref 0.35–4.50)

## 2017-12-15 LAB — T4, FREE: Free T4: 0.73 ng/dL (ref 0.60–1.60)

## 2017-12-15 NOTE — Telephone Encounter (Signed)
Patient is returning your phone call. 

## 2017-12-15 NOTE — Telephone Encounter (Signed)
-----   Message from Philemon Kingdom, MD sent at 12/15/2017 12:31 PM EDT ----- Loma Sousa, can you please call pt: TSH is still suppressed on 25 mcg of levothyroxine every other day.  At this point, we need to stop levothyroxine and have her back for labs in 1.5 months.  Can you please order a TSH, free T4, free T3- for then?

## 2017-12-16 ENCOUNTER — Telehealth: Payer: Self-pay | Admitting: Internal Medicine

## 2017-12-16 NOTE — Telephone Encounter (Signed)
See result notes. 

## 2017-12-16 NOTE — Telephone Encounter (Signed)
Patient returned your call.

## 2017-12-16 NOTE — Telephone Encounter (Signed)
Call in regard to lab results.

## 2017-12-20 ENCOUNTER — Encounter: Payer: Self-pay | Admitting: Cardiology

## 2018-01-17 ENCOUNTER — Other Ambulatory Visit: Payer: Medicare Other

## 2018-01-19 ENCOUNTER — Ambulatory Visit (INDEPENDENT_AMBULATORY_CARE_PROVIDER_SITE_OTHER): Payer: Medicare Other

## 2018-01-19 ENCOUNTER — Other Ambulatory Visit: Payer: Self-pay

## 2018-01-19 DIAGNOSIS — R011 Cardiac murmur, unspecified: Secondary | ICD-10-CM

## 2018-01-21 ENCOUNTER — Other Ambulatory Visit: Payer: Self-pay | Admitting: Internal Medicine

## 2018-01-24 ENCOUNTER — Telehealth: Payer: Self-pay | Admitting: *Deleted

## 2018-01-24 ENCOUNTER — Other Ambulatory Visit (INDEPENDENT_AMBULATORY_CARE_PROVIDER_SITE_OTHER): Payer: Medicare Other

## 2018-01-24 ENCOUNTER — Encounter: Payer: Self-pay | Admitting: *Deleted

## 2018-01-24 DIAGNOSIS — E0590 Thyrotoxicosis, unspecified without thyrotoxic crisis or storm: Secondary | ICD-10-CM | POA: Diagnosis not present

## 2018-01-24 DIAGNOSIS — E039 Hypothyroidism, unspecified: Secondary | ICD-10-CM

## 2018-01-24 DIAGNOSIS — R079 Chest pain, unspecified: Secondary | ICD-10-CM

## 2018-01-24 LAB — TSH: TSH: 1.06 u[IU]/mL (ref 0.35–4.50)

## 2018-01-24 LAB — T4, FREE: FREE T4: 0.64 ng/dL (ref 0.60–1.60)

## 2018-01-24 LAB — T3, FREE: T3 FREE: 3.9 pg/mL (ref 2.3–4.2)

## 2018-01-24 NOTE — Telephone Encounter (Signed)
-----   Message from Arnoldo Lenis, MD sent at 01/24/2018 12:43 PM EDT ----- Echo looks good, normal heart function. Please order a lexiscan to further evalaute for any possibly blockages as the cause of her symptoms   Zandra Abts MD

## 2018-01-24 NOTE — Telephone Encounter (Signed)
Pt aware and agreeable to stress test - will place orders and forward to scheduling - routed to pcp

## 2018-01-25 ENCOUNTER — Telehealth: Payer: Self-pay | Admitting: Cardiology

## 2018-01-25 NOTE — Telephone Encounter (Signed)
Patient called back to reschedule  Bethany Cunningham Scheduled 7/29 arrrival at 8:30

## 2018-01-25 NOTE — Telephone Encounter (Signed)
Pre-cert Verification for the following procedure   Lexiscan scheduled for 01-27-2018 at [facility]

## 2018-01-27 ENCOUNTER — Encounter (HOSPITAL_COMMUNITY): Payer: Medicare Other

## 2018-01-27 ENCOUNTER — Other Ambulatory Visit (HOSPITAL_COMMUNITY): Payer: Medicare Other

## 2018-01-31 ENCOUNTER — Encounter (HOSPITAL_COMMUNITY): Payer: Self-pay

## 2018-01-31 ENCOUNTER — Encounter (HOSPITAL_BASED_OUTPATIENT_CLINIC_OR_DEPARTMENT_OTHER)
Admission: RE | Admit: 2018-01-31 | Discharge: 2018-01-31 | Disposition: A | Discharge status: Home / Self Care | Payer: Medicare Other | Source: Ambulatory Visit | Attending: Cardiology | Admitting: Cardiology

## 2018-01-31 ENCOUNTER — Encounter (HOSPITAL_COMMUNITY)
Admission: RE | Admit: 2018-01-31 | Discharge: 2018-01-31 | Disposition: A | Discharge status: Home / Self Care | Payer: Medicare Other | Source: Ambulatory Visit | Attending: Cardiology | Admitting: Cardiology

## 2018-01-31 DIAGNOSIS — R079 Chest pain, unspecified: Secondary | ICD-10-CM | POA: Insufficient documentation

## 2018-01-31 LAB — NM MYOCAR MULTI W/SPECT W/WALL MOTION / EF
CHL CUP NUCLEAR SDS: 1
LV dias vol: 79 mL (ref 46–106)
LV sys vol: 18 mL
Peak HR: 96 {beats}/min
RATE: 0.43
Rest HR: 75 {beats}/min
SRS: 3
SSS: 4
TID: 1.11

## 2018-01-31 MED ORDER — REGADENOSON 0.4 MG/5ML IV SOLN
INTRAVENOUS | Status: AC
  Administered 2018-01-31: 0.4 mg via INTRAVENOUS
  Filled 2018-01-31: qty 5

## 2018-01-31 MED ORDER — TECHNETIUM TC 99M TETROFOSMIN IV KIT
10.0000 | PACK | Freq: Once | INTRAVENOUS | Status: AC | PRN
  Administered 2018-01-31: 11 via INTRAVENOUS

## 2018-01-31 MED ORDER — SODIUM CHLORIDE 0.9% FLUSH
INTRAVENOUS | Status: AC
  Administered 2018-01-31: 10 mL via INTRAVENOUS
  Filled 2018-01-31: qty 10

## 2018-01-31 MED ORDER — TECHNETIUM TC 99M TETROFOSMIN IV KIT
30.0000 | PACK | Freq: Once | INTRAVENOUS | Status: AC | PRN
  Administered 2018-01-31: 30 via INTRAVENOUS

## 2018-02-01 ENCOUNTER — Telehealth: Payer: Self-pay | Admitting: *Deleted

## 2018-02-01 NOTE — Telephone Encounter (Signed)
Pt aware and voiced understanding - 6 week f/u scheduled - routed to pcp

## 2018-02-01 NOTE — Telephone Encounter (Signed)
-----   Message from Arnoldo Lenis, MD sent at 02/01/2018 11:20 AM EDT ----- Stress test overall looks good and shows the heart is gettign plenty of blood. There is a very small area that is probably normal shadowing, but may reflect a very small blockage that would still be considered low risk. Overall the findings are reassuing .  F/u 6 weeks to reevaluate symptoms.   Carlyle Dolly MD

## 2018-03-11 ENCOUNTER — Ambulatory Visit: Payer: Medicare Other | Admitting: Internal Medicine

## 2018-03-11 ENCOUNTER — Encounter: Payer: Self-pay | Admitting: Internal Medicine

## 2018-03-11 VITALS — BP 138/80 | HR 76 | Temp 98.2°F | Ht 62.0 in | Wt 229.2 lb

## 2018-03-11 DIAGNOSIS — E0500 Thyrotoxicosis with diffuse goiter without thyrotoxic crisis or storm: Secondary | ICD-10-CM | POA: Diagnosis not present

## 2018-03-11 LAB — T3, FREE: T3, Free: 3.7 pg/mL (ref 2.3–4.2)

## 2018-03-11 LAB — TSH: TSH: 0.5 u[IU]/mL (ref 0.35–4.50)

## 2018-03-11 LAB — T4, FREE: FREE T4: 0.53 ng/dL — AB (ref 0.60–1.60)

## 2018-03-11 NOTE — Progress Notes (Signed)
Patient ID: EISLEY BARBER, female   DOB: 02-02-1951, 67 y.o.   MRN: [id number]    HPI  LUCILLE WITTS is a 67 y.o.-year-old female, returning for f/u for Graves ds (previous h/o hypothyroidism). Last visit 6 months ago.  Reviewed and addended history: Pt. has been dx with hypothyroidism in approximately 2014 >> on Levothyroxine 112 mcg >> TSH decreased >> dose of LT4 repeatedly decreased >> stopped 05-12/2015. Despite this, TSH remained suppressed.  03/06/2016: TSH 0.07, free T4 1.1 (0.8-1.8) 01/30/2016: TSH 0.11, free T4 0.9  Patient had a thyroid ultrasound on 03/02/2016 that showed a heterogeneous thyroid, without nodules.  Her thyroid antibodies were high: Lab Results  Component Value Date   TSI 507 (H) 05/01/2016   We initially started methimazole 5 mg daily and decrease the dose to 2.5 mg daily in 06/2016.  She was able to come off methimazole completely in 08/2016.    However, her TSH increased again and free T4 decreased to lower than normal.  We started low-dose levothyroxine, 25 mcg daily, but subsequent TFTs have been thyrotoxic so we ended up stopping levothyroxine 01/24/2018 with normal TFTs.  Reviewed her TFTs: Lab Results  Component Value Date   TSH 1.06 01/24/2018   TSH 0.25 (L) 12/15/2017   TSH 0.32 (L) 10/21/2017   TSH 0.55 03/24/2017   TSH 3.69 01/15/2017   TSH 2.25 12/09/2016   TSH 0.93 10/07/2016   TSH 6.64 (H) 08/31/2016   TSH 10.54 (H) 07/02/2016   TSH 0.05 (L) 05/01/2016   FREET4 0.64 01/24/2018   FREET4 0.73 12/15/2017   FREET4 0.63 10/21/2017   FREET4 0.76 03/24/2017   FREET4 0.49 (L) 01/15/2017   FREET4 0.55 (L) 12/09/2016   FREET4 0.59 (L) 10/07/2016   FREET4 0.51 (L) 08/31/2016   FREET4 0.43 (L) 07/02/2016   FREET4 0.65 05/01/2016   T3FREE 3.9 01/24/2018   T3FREE 3.7 03/24/2017   T3FREE 3.9 01/15/2017   T3FREE 3.5 12/09/2016   T3FREE 4.5 (H) 10/07/2016   T3FREE 3.9 08/31/2016   T3FREE 3.6 07/02/2016   T3FREE 4.7 (H) 05/01/2016    Pt denies: - feeling nodules in neck - hoarseness - dysphagia - choking - SOB with lying down  She has + FH of thyroid disorders in: mother and sister (both with Graves ds. - s/p RAI tx). No FH of thyroid cancer. No h/o radiation tx to head or neck.  No seaweed or kelp. No recent contrast studies. No herbal supplements. No Biotin use. No recent steroids use.   Pt. also has a history of HTN, HL, TAH, cholecystectomy.   ROS: Constitutional: + weight gain/no weight loss, no fatigue, no subjective hyperthermia, no subjective hypothermia Eyes: no blurry vision, no xerophthalmia ENT: no sore throat, + see HPI Cardiovascular: no CP/+ SOB/no palpitations/no leg swelling Respiratory: + cough/+ SOB/no wheezing Gastrointestinal: no N/no V/no D/no C/no acid reflux Musculoskeletal: + muscle aches/+ joint aches Skin: no rashes, no hair loss Neurological: no tremors/no numbness/no tingling/no dizziness, + HA  I reviewed pt's medications, allergies, PMH, social hx, family hx, and changes were documented in the history of present illness. Otherwise, unchanged from my initial visit note.  Past Medical History:  Diagnosis Date  . Diverticulosis   . Dyslipidemia   . HTN (hypertension)   . Hypothyroid   . Skin disorder    Past Surgical History:  Procedure Laterality Date  . ABDOMINAL HYSTERECTOMY    . CHOLECYSTECTOMY    . TONSILLECTOMY     Social History  Social History  . Marital status: Single    Spouse name: N/A  . Number of children: 0   Occupational History  . retired Pharmacist, hospital Retired   Social History Main Topics  . Smoking status: former Smoker - quit 2003  . Smokeless tobacco: No       . Alcohol use No  . Drug use: No   Current Outpatient Medications on File Prior to Visit  Medication Sig Dispense Refill  . aspirin EC 81 MG tablet Take 81 mg by mouth daily.    . famotidine (PEPCID) 40 MG tablet Take 1 tablet by mouth as needed.  0  . levothyroxine (SYNTHROID,  LEVOTHROID) 25 MCG tablet Take 25 mcg by mouth every other day.    . losartan (COZAAR) 50 MG tablet Take 50 mg by mouth daily.     No current facility-administered medications on file prior to visit.    No Known Allergies Family History  Problem Relation Age of Onset  . AAA (abdominal aortic aneurysm) Mother   . Colon cancer Neg Hx   . Inflammatory bowel disease Neg Hx     PE: BP 138/80 (BP Location: Left Arm, Patient Position: Sitting, Cuff Size: Normal)   Pulse 76   Temp 98.2 F (36.8 C) (Oral)   Ht 5\' 2"  (1.575 m)   Wt 229 lb 3.2 oz (104 kg)   SpO2 96%   BMI 41.92 kg/m  Wt Readings from Last 3 Encounters:  03/11/18 229 lb 3.2 oz (104 kg)  12/14/17 232 lb 3.2 oz (105.3 kg)  11/03/17 220 lb (99.8 kg)   Constitutional: overweight, in NAD Eyes: PERRLA, EOMI, no exophthalmos ENT: moist mucous membranes, no thyromegaly, no cervical lymphadenopathy Cardiovascular: RRR, No RG, +1/6 SEM Respiratory: CTA B Gastrointestinal: abdomen soft, NT, ND, BS+ Musculoskeletal: no deformities, strength intact in all 4 Skin: moist, warm, no rashes Neurological: no tremor with outstretched hands, DTR normal in all 4  ASSESSMENT: 1. Graves ds.  PLAN:  1.  Patient with a history of hypothyroidism followed by thyroid toxicity confirmed this Graves' disease based on elevated TSI antibodies, absence of nodules on the thyroid ultrasound and protracted course.  She was on methimazole and did well on this and finally came off the medication in 08/2016.  Her tests trended towards hypothyroidism again, therefore, we started levothyroxine low-dose (25 mcg daily).  However, TSH started to decrease soon after and we ended up stopping levothyroxine 01/2018. - Reviewed most recent TFTs which were normal 2 months ago -We will repeat these today along with TSI antibodies. -We did discuss that if we need to restart levothyroxine, she needs to take it daily, with water, more than 30 minutes before breakfast  and separated by more than 4 hours from acid reflux medicines, calcium, iron, multivitamins. - If labs are abnormal today, she will need to return for repeat TFTs in 1.5 months - Otherwise, I will see her back in 6 months  Refused a flu shot today.  - time spent with the patient: 15 minutes, of which >50% was spent in obtaining information about her symptoms, reviewing her previous labs, evaluations, and treatments, counseling her about her condition (please see the discussed topics above), and developing a plan to further investigate and treat it; she had a number of questions which I addressed.  Component     Latest Ref Rng & Units 03/11/2018  TSH     0.35 - 4.50 uIU/mL 0.50  T4,Free(Direct)     0.60 - 1.60  ng/dL 0.53 (L)  Triiodothyronine,Free,Serum     2.3 - 4.2 pg/mL 3.7  TSI     <140 % baseline 446 (H)  Her TSI antibodies are still high, but improved.  TSH is normal, with slightly low free T4 and normal free T3.  For now, I would suggest to continue off levothyroxine and will repeat her TFTs when she returns in 6 months.  Philemon Kingdom, MD PhD [facility]

## 2018-03-11 NOTE — Patient Instructions (Signed)
Please stop at the lab.  Continue to stay off Levothyroxine for now.  However, if we need to restart it, Take the thyroid hormone every day, with water, at least 30 minutes before breakfast, separated by at least 4 hours from: - acid reflux medications - calcium - iron - multivitamins  Please come back for a follow-up appointment in 6 months.

## 2018-03-15 LAB — THYROID STIMULATING IMMUNOGLOBULIN: TSI: 446 %{baseline} — AB (ref <140)

## 2018-03-16 ENCOUNTER — Telehealth: Payer: Self-pay

## 2018-03-16 NOTE — Telephone Encounter (Signed)
-----   Message from Philemon Kingdom, MD sent at 03/16/2018  9:07 AM EDT ----- Loma Sousa, can you please call pt: Her Graves' antibodies are still high, but improved.  TSH is normal, with only a slightly low free T4 and a normal free T3.  For now, I would suggest to continue off levothyroxine and will repeat her TFTs when she returns in 6 months.

## 2018-03-16 NOTE — Telephone Encounter (Signed)
Pt was scheduled with Dr Loanne Drilling as mistake, please correct for her

## 2018-03-25 ENCOUNTER — Other Ambulatory Visit: Payer: Medicare Other

## 2018-03-29 ENCOUNTER — Ambulatory Visit: Payer: Medicare Other | Admitting: Cardiology

## 2018-03-30 ENCOUNTER — Ambulatory Visit: Payer: Medicare Other | Admitting: Cardiology

## 2018-03-30 ENCOUNTER — Encounter: Payer: Self-pay | Admitting: Cardiology

## 2018-03-30 VITALS — BP 151/75 | HR 74 | Ht 62.0 in | Wt 227.8 lb

## 2018-03-30 DIAGNOSIS — R0609 Other forms of dyspnea: Secondary | ICD-10-CM

## 2018-03-30 DIAGNOSIS — R079 Chest pain, unspecified: Secondary | ICD-10-CM | POA: Diagnosis not present

## 2018-03-30 DIAGNOSIS — I10 Essential (primary) hypertension: Secondary | ICD-10-CM

## 2018-03-30 NOTE — Patient Instructions (Signed)
Medication Instructions:   Stop Aspirin.   Synthroid removed from medication list.   Continue all other medications.    Labwork: none  Testing/Procedures: none  Follow-Up: Your physician wants you to follow up in:  1 year.  You will receive a reminder letter in the mail one-two months in advance.  If you don't receive a letter, please call our office to schedule the follow up appointment   Any Other Special Instructions Will Be Listed Below (If Applicable).  If you need a refill on your cardiac medications before your next appointment, please call your pharmacy.

## 2018-03-30 NOTE — Progress Notes (Signed)
Clinical Summary Ms. Gladwell is a 67 y.o.female seen today for follow up of the following medical problems.   1. Chest pressure/DOE  - last visit she reported episodes of chest pain and SOB - pressure started 4-5 months ago. Midchest, mainly with activities. 5/10 in severity. +SOB. Can have some nausea at times. Pressure lasts few seconds to minutes. Pressure has decreased some since onset, occurs <1-2 times per week. Not positional.  - 30-35 minutes jogging in place without troubles - symptoms with walking 150 yards or more, previously could do without troubles.  - CAD risk factors: HTN, prediabetes, former tobacco x 30   01/2018 echo: LVEF 16-10%, grade I diastolic dysfunction 03/6044 nuclear stress: mild apical defect, may be differences in apical thinning vs very mild ischemia.  - no recurrent symptoms since last visit.  2. HTN - compliant with meds.   Past Medical History:  Diagnosis Date  . Diverticulosis   . Dyslipidemia   . HTN (hypertension)   . Hypothyroid   . Skin disorder      No Known Allergies   Current Outpatient Medications  Medication Sig Dispense Refill  . aspirin EC 81 MG tablet Take 81 mg by mouth daily.    . famotidine (PEPCID) 40 MG tablet Take 1 tablet by mouth as needed.  0  . losartan (COZAAR) 50 MG tablet Take 50 mg by mouth daily.     No current facility-administered medications for this visit.      Past Surgical History:  Procedure Laterality Date  . ABDOMINAL HYSTERECTOMY    . CHOLECYSTECTOMY    . TONSILLECTOMY       No Known Allergies    Family History  Problem Relation Age of Onset  . AAA (abdominal aortic aneurysm) Mother   . Colon cancer Neg Hx   . Inflammatory bowel disease Neg Hx      Social History Ms. Olazabal reports that she quit smoking about 16 years ago. She has never used smokeless tobacco. Ms. Brawley reports that she does not drink alcohol.   Review of Systems CONSTITUTIONAL: No weight  loss, fever, chills, weakness or fatigue.  HEENT: Eyes: No visual loss, blurred vision, double vision or yellow sclerae.No hearing loss, sneezing, congestion, runny nose or sore throat.  SKIN: No rash or itching.  CARDIOVASCULAR: per hpi RESPIRATORY: No shortness of breath, cough or sputum.  GASTROINTESTINAL: No anorexia, nausea, vomiting or diarrhea. No abdominal pain or blood.  GENITOURINARY: No burning on urination, no polyuria NEUROLOGICAL: No headache, dizziness, syncope, paralysis, ataxia, numbness or tingling in the extremities. No change in bowel or bladder control.  MUSCULOSKELETAL: No muscle, back pain, joint pain or stiffness.  LYMPHATICS: No enlarged nodes. No history of splenectomy.  PSYCHIATRIC: No history of depression or anxiety.  ENDOCRINOLOGIC: No reports of sweating, cold or heat intolerance. No polyuria or polydipsia.  Marland Kitchen   Physical Examination Vitals:   03/30/18 0910  BP: (!) 151/75  Pulse: 74  SpO2: 95%   Vitals:   03/30/18 0910  Weight: 227 lb 12.8 oz (103.3 kg)  Height: 5\' 2"  (1.575 m)    Gen: resting comfortably, no acute distress HEENT: no scleral icterus, pupils equal round and reactive, no palptable cervical adenopathy,  CV: RRR, 2/6 systolic murmur rusb, no jvd Resp: Clear to auscultation bilaterally GI: abdomen is soft, non-tender, non-distended, normal bowel sounds, no hepatosplenomegaly MSK: extremities are warm, no edema.  Skin: warm, no rash Neuro:  no focal deficits Psych: appropriate affect  Diagnostic Studies  01/2018 echo Study Conclusions  - Procedure narrative: Transthoracic echocardiography. Image   quality was poor. The study was technically difficult, as a   result of poor acoustic windows and body habitus. - Left ventricle: The cavity size was normal. Wall thickness was   increased in a pattern of mild LVH. Systolic function was normal.   The estimated ejection fraction was in the range of 60% to 65%.   Images were  inadequate for LV wall motion assessment. Doppler   parameters are consistent with abnormal left ventricular   relaxation (grade 1 diastolic dysfunction). Doppler parameters   are consistent with high ventricular filling pressure. - Aortic valve: There was mild regurgitation. - Mitral valve: Mildly calcified annulus. Normal thickness leaflets  01/2018 nuclear stress  There was no ST segment deviation noted during stress.  This is a low risk study.  The left ventricular ejection fraction is hyperdynamic (>65%).  Small very mild reversible apical defect. May represent differences in apical thinning vs very mild apical ischemia, either finding would support low risk. Assessment and Plan   1. Chest pressure/DOE - benign cardiac workup, symptoms have resolved - no further testing at this time. D/c aspirin  2. HTN - manula repeat 134/75, reasonable control at this time. Continue current meds   F/u 1 year      Arnoldo Lenis, M.D.

## 2018-05-30 ENCOUNTER — Encounter: Payer: Self-pay | Admitting: Internal Medicine

## 2018-05-30 DIAGNOSIS — E0500 Thyrotoxicosis with diffuse goiter without thyrotoxic crisis or storm: Secondary | ICD-10-CM

## 2018-05-30 LAB — COLOGUARD

## 2018-05-30 NOTE — Progress Notes (Signed)
Received labs from PCP, drawn on 05/24/2018: - TSH 0.272 (0.45-4.5) The rest of the labs were reviewed and will be scanned.  TSH is again suppressed.  Due to previously increased TSH, I would like to repeat a TSH before restarting methimazole.  I will have her come back in a month for repeat.

## 2018-05-31 ENCOUNTER — Telehealth: Payer: Self-pay

## 2018-05-31 NOTE — Telephone Encounter (Signed)
Notified patient of message from Dr. Gherghe, patient expressed understanding and agreement. No further questions.  Lab appointment scheduled. 

## 2018-05-31 NOTE — Telephone Encounter (Signed)
Left message for patient to return our call at [phone].  

## 2018-05-31 NOTE — Telephone Encounter (Signed)
-----   Message from Philemon Kingdom, MD sent at 05/30/2018  2:56 PM EST ----- [name], can you please call pt:   Received labs from PCP, drawn on 05/24/2018: TSH was a little low: - TSH 0.272 (0.45-4.5)  Due to previously increased TSH, I would like to repeat a TSH before restarting methimazole.  Can you please ask her to come back in a month for repeat.  Labs are in. Ty, C

## 2018-06-13 ENCOUNTER — Other Ambulatory Visit: Payer: Medicare Other

## 2018-06-20 ENCOUNTER — Encounter: Payer: Self-pay | Admitting: Gastroenterology

## 2018-06-24 ENCOUNTER — Other Ambulatory Visit (INDEPENDENT_AMBULATORY_CARE_PROVIDER_SITE_OTHER): Payer: Medicare Other

## 2018-06-24 ENCOUNTER — Other Ambulatory Visit: Payer: Self-pay | Admitting: Internal Medicine

## 2018-06-24 DIAGNOSIS — E0500 Thyrotoxicosis with diffuse goiter without thyrotoxic crisis or storm: Secondary | ICD-10-CM

## 2018-06-24 LAB — T4, FREE: Free T4: 0.56 ng/dL — ABNORMAL LOW (ref 0.60–1.60)

## 2018-06-24 LAB — TSH: TSH: 0.37 u[IU]/mL (ref 0.35–4.50)

## 2018-06-24 LAB — T3, FREE: T3 FREE: 4.1 pg/mL (ref 2.3–4.2)

## 2018-06-27 ENCOUNTER — Telehealth: Payer: Self-pay

## 2018-06-27 NOTE — Telephone Encounter (Signed)
-----   Message from Philemon Kingdom, MD sent at 06/24/2018 12:46 PM EST ----- [name], can you please call pt: Thyroid tests are normal.  No need for intervention for now, but I would like to see her back in about 3 months and we will recheck then.  Can you please schedule this appointment?

## 2018-06-27 NOTE — Telephone Encounter (Signed)
Opened in error

## 2018-07-07 NOTE — Telephone Encounter (Signed)
LMTCB

## 2018-07-08 NOTE — Telephone Encounter (Signed)
LMTCB

## 2018-09-06 ENCOUNTER — Ambulatory Visit: Payer: Medicare Other | Admitting: Gastroenterology

## 2018-09-06 ENCOUNTER — Encounter: Payer: Self-pay | Admitting: *Deleted

## 2018-09-06 ENCOUNTER — Other Ambulatory Visit: Payer: Self-pay | Admitting: *Deleted

## 2018-09-06 ENCOUNTER — Encounter: Payer: Self-pay | Admitting: Gastroenterology

## 2018-09-06 DIAGNOSIS — K760 Fatty (change of) liver, not elsewhere classified: Secondary | ICD-10-CM | POA: Diagnosis not present

## 2018-09-06 DIAGNOSIS — R195 Other fecal abnormalities: Secondary | ICD-10-CM | POA: Diagnosis not present

## 2018-09-06 MED ORDER — NA SULFATE-K SULFATE-MG SULF 17.5-3.13-1.6 GM/177ML PO SOLN: 1.0000 | ORAL | 0 refills | Status: DC

## 2018-09-06 NOTE — Progress Notes (Signed)
Labs from 05/2018 reviewed: A1c 5.6, TSH 0.272, free T4 1.03, white blood cell count 7600, hemoglobin 14.1, hematocrit 44.3, MCV 83, platelets 344,000, BUN 15, creatinine 0.74, sodium 138, potassium 4.4, albumin 4.5, total bilirubin 0.5, alkaline phosphatase 64, AST 22, ALT 31

## 2018-09-06 NOTE — Progress Notes (Addendum)
Primary Care Physician:  Celene Squibb, MD  Primary Gastroenterologist:  Barney Drain, MD REVIEWED-[facility] FOR POSITIVE COLOGUARD/MOTHER HAS COLON POLYPS.  Chief Complaint  Patient presents with  . Consult    TCS. never has had done prior. Mother history of polyps    HPI:  Bethany Cunningham is a 68 y.o. female here at the request of Dr. Nevada Crane for further evaluation of positive Cologuard.  We last saw her in 2014 when she presented with rectal bleeding.  Plan for colonoscopy at that time but she canceled.  She has a family history of colon polyps, mother.  No family history of colon cancer.  Patient also has a history of fatty liver seen on CT back in 2014.  Bowel movements are regular.  No blood in the stool or melena.  Denies abdominal pain.  Appetite good.  She is vegetarian.  Sometimes her stools are more loose than others depending on what she has eaten.  No heartburn.  No dysphagia.  No unintentional weight loss.  Current Outpatient Medications  Medication Sig Dispense Refill  . losartan (COZAAR) 50 MG tablet Take 50 mg by mouth daily.     No current facility-administered medications for this visit.     Allergies as of 09/06/2018  . (No Known Allergies)    Past Medical History:  Diagnosis Date  . Diverticulosis   . Dyslipidemia   . HTN (hypertension)   . Hypothyroid   . Skin disorder     Past Surgical History:  Procedure Laterality Date  . ABDOMINAL HYSTERECTOMY    . CHOLECYSTECTOMY    . TONSILLECTOMY      Family History  Problem Relation Age of Onset  . AAA (abdominal aortic aneurysm) Mother   . Colon polyps Mother   . Lung cancer Father   . Colon cancer Neg Hx   . Inflammatory bowel disease Neg Hx     Social History   Socioeconomic History  . Marital status: Single    Spouse name: Not on file  . Number of children: 0  . Years of education: Not on file  . Highest education level: Not on file  Occupational History  . Occupation: retired Herbalist: RETIRED  Social Needs  . Financial resource strain: Not on file  . Food insecurity:    Worry: Not on file    Inability: Not on file  . Transportation needs:    Medical: Not on file    Non-medical: Not on file  Tobacco Use  . Smoking status: Former Smoker    Last attempt to quit: 07/06/2001    Years since quitting: 17.1  . Smokeless tobacco: Never Used  . Tobacco comment: Former smoker/ quit x 8 years  Substance and Sexual Activity  . Alcohol use: Yes    Comment: occas  . Drug use: No  . Sexual activity: Not on file  Lifestyle  . Physical activity:    Days per week: Not on file    Minutes per session: Not on file  . Stress: Not on file  Relationships  . Social connections:    Talks on phone: Not on file    Gets together: Not on file    Attends religious service: Not on file    Active member of club or organization: Not on file    Attends meetings of clubs or organizations: Not on file    Relationship status: Not on file  . Intimate partner violence:    Fear of current  or ex partner: Not on file    Emotionally abused: Not on file    Physically abused: Not on file    Forced sexual activity: Not on file  Other Topics Concern  . Not on file  Social History Narrative  . Not on file      ROS:  General: Negative for anorexia, weight loss, fever, chills, fatigue, weakness. Eyes: Negative for vision changes.  ENT: Negative for hoarseness, difficulty swallowing , nasal congestion. CV: Negative for chest pain, angina, palpitations, dyspnea on exertion, peripheral edema.  Respiratory: Negative for dyspnea at rest, dyspnea on exertion, cough, sputum, wheezing.  GI: See history of present illness. GU:  Negative for dysuria, hematuria, urinary incontinence, urinary frequency, nocturnal urination.  MS: Negative for joint pain, low back pain.  Derm: Positive for rash or itching.  Neuro: Negative for weakness, abnormal sensation, seizure, frequent headaches, memory loss,  confusion.  Psych: Negative for anxiety, depression, suicidal ideation, hallucinations.  Endo: Negative for unusual weight change.  Heme: Negative for bruising or bleeding. Allergy: Negative for rash or hives.    Physical Examination:  BP (!) 145/73   Pulse 83   Temp (!) 97.3 F (36.3 C) (Oral)   Ht 5\' 2"  (1.575 m)   Wt 236 lb (107 kg)   BMI 43.16 kg/m    General: Well-nourished, well-developed in no acute distress.  Head: Normocephalic, atraumatic.   Eyes: Conjunctiva pink, no icterus. Mouth: Oropharyngeal mucosa moist and pink , no lesions erythema or exudate. Neck: Supple without thyromegaly, masses, or lymphadenopathy.  Lungs: Clear to auscultation bilaterally.  Heart: Regular rate and rhythm, no rubs or gallops.  2/6 systolic ejection murmur Abdomen: Bowel sounds are normal, nontender, nondistended, no hepatosplenomegaly or masses, no abdominal bruits or    hernia , no rebound or guarding.   Rectal: Not performed Extremities: No lower extremity edema. No clubbing or deformities.  Neuro: Alert and oriented x 4 , grossly normal neurologically.  Skin: Warm and dry, no rash or jaundice.   Psych: Alert and cooperative, normal mood and affect.     Imaging Studies: No results found.

## 2018-09-06 NOTE — Patient Instructions (Signed)
1. Colonoscopy as scheduled.  Please see separate instructions. 2. I will check your recent labs from PCP for review regarding history of fatty liver.  We will let you know if any further labs are needed. 3. Would consider follow-up with [place] yearly for fatty liver, consider updating ultrasound at some point within the next 12 months.   Fatty Liver Disease  Fatty liver disease occurs when too much fat has built up in your liver cells. Fatty liver disease is also called hepatic steatosis or steatohepatitis. The liver removes harmful substances from your bloodstream and produces fluids that your body needs. It also helps your body use and store energy from the food you eat. In many cases, fatty liver disease does not cause symptoms or problems. It is often diagnosed when tests are being done for other reasons. However, over time, fatty liver can cause inflammation that may lead to more serious liver problems, such as scarring of the liver (cirrhosis) and liver failure. Fatty liver is associated with insulin resistance, increased body fat, high blood pressure (hypertension), and high cholesterol. These are features of metabolic syndrome and increase your risk for stroke, diabetes, and heart disease. What are the causes? This condition may be caused by:  Drinking too much alcohol.  Poor nutrition.  Obesity.  Cushing's syndrome.  Diabetes.  High cholesterol.  Certain drugs.  Poisons.  Some viral infections.  Pregnancy. What increases the risk? You are more likely to develop this condition if you:  Abuse alcohol.  Are overweight.  Have diabetes.  Have hepatitis.  Have a high triglyceride level.  Are pregnant. What are the signs or symptoms? Fatty liver disease often does not cause symptoms. If symptoms do develop, they can include:  Fatigue.  Weakness.  Weight loss.  Confusion.  Abdominal pain.  Nausea and vomiting.  Yellowing of your skin and the white parts of  your eyes (jaundice).  Itchy skin. How is this diagnosed? This condition may be diagnosed by:  A physical exam and medical history.  Blood tests.  Imaging tests, such as an ultrasound, CT scan, or MRI.  A liver biopsy. A small sample of liver tissue is removed using a needle. The sample is then looked at under a microscope. How is this treated? Fatty liver disease is often caused by other health conditions. Treatment for fatty liver may involve medicines and lifestyle changes to manage conditions such as:  Alcoholism.  High cholesterol.  Diabetes.  Being overweight or obese. Follow these instructions at home:   Do not drink alcohol. If you have trouble quitting, ask your health care provider how to safely quit with the help of medicine or a supervised program. This is important to keep your condition from getting worse.  Eat a healthy diet as told by your health care provider. Ask your health care provider about working with a diet and nutrition specialist (dietitian) to develop an eating plan.  Exercise regularly. This can help you lose weight and control your cholesterol and diabetes. Talk to your health care provider about an exercise plan and which activities are best for you.  Take over-the-counter and prescription medicines only as told by your health care provider.  Keep all follow-up visits as told by your health care provider. This is important. Contact a health care provider if: You have trouble controlling your:  Blood sugar. This is especially important if you have diabetes.  Cholesterol.  Drinking of alcohol. Get help right away if:  You have abdominal pain.  You have jaundice.  You have nausea and vomiting.  You vomit blood or material that looks like coffee grounds.  You have stools that are black, tar-like, or bloody. Summary  Fatty liver disease develops when too much fat builds up in the cells of your liver.  Fatty liver disease often causes  no symptoms or problems. However, over time, fatty liver can cause inflammation that may lead to more serious liver problems, such as scarring of the liver (cirrhosis).  You are more likely to develop this condition if you abuse alcohol, are pregnant, are overweight, have diabetes, have hepatitis, or have high triglyceride levels.  Contact your health care provider if you have trouble controlling your weight, blood sugar, cholesterol, or drinking of alcohol. This information is not intended to replace advice given to you by your health care provider. Make sure you discuss any questions you have with your health care provider. Document Released: 08/07/2005 Document Revised: 03/31/2017 Document Reviewed: 03/31/2017 Elsevier Interactive Patient Education  2019 [employer]   Nonalcoholic Fatty Liver Disease Diet Nonalcoholic fatty liver disease is a condition that causes fat to accumulate in and around the liver. The disease makes it harder for the liver to work the way that it should. Following a healthy diet can help to keep nonalcoholic fatty liver disease under control. It can also help to prevent or improve conditions that are associated with the disease, such as heart disease, diabetes, high blood pressure, and abnormal cholesterol levels. Along with regular exercise, this diet:  Promotes weight loss.  Helps to control blood sugar levels.  Helps to improve the way that the body uses insulin. What do I need to know about this diet?  Use the glycemic index (GI) to plan your meals. The index tells you how quickly a food will raise your blood sugar. Choose low-GI foods. These foods take a longer time to raise blood sugar.  Keep track of how many calories you take in. Eating the right amount of calories will help you to achieve a healthy weight.  You may want to follow a Mediterranean diet. This diet includes a lot of vegetables, lean meats or fish, whole grains, fruits, and healthy oils and  fats. What foods can I eat? Grains Whole grains, such as whole-wheat or whole-grain breads, crackers, tortillas, cereals, and pasta. Stone-ground whole wheat. Pumpernickel bread. Unsweetened oatmeal. Bulgur. Barley. Quinoa. Brown or wild rice. Corn or whole-wheat flour tortillas. Vegetables Lettuce. Spinach. Peas. Beets. Cauliflower. Cabbage. Broccoli. Carrots. Tomatoes. Squash. Eggplant. Herbs. Peppers. Onions. Cucumbers. Brussels sprouts. Yams and sweet potatoes. Beans. Lentils. Fruits Bananas. Apples. Oranges. Grapes. Papaya. Mango. Pomegranate. Kiwi. Grapefruit. Cherries. Meats and Other Protein Sources Seafood and shellfish. Lean meats. Poultry. Tofu. Dairy Low-fat or fat-free dairy products, such as yogurt, cottage cheese, and cheese. Beverages Water. Sugar-free drinks. Tea. Coffee. Low-fat or skim milk. Milk alternatives, such as soy or almond milk. Real fruit juice. Condiments Mustard. Relish. Low-fat, low-sugar ketchup and barbecue sauce. Low-fat or fat-free mayonnaise. Sweets and Desserts Sugar-free sweets. Fats and Oils Avocado. Canola or olive oil. Nuts and nut butters. Seeds. The items listed above may not be a complete list of recommended foods or beverages. Contact your dietitian for more options. What foods are not recommended? Palm oil and coconut oil. Processed foods. Fried foods. Sweetened drinks, such as sweet tea, milkshakes, snow cones, iced sweet drinks, and sodas. Alcohol. Sweets. Foods that contain a lot of salt or sodium. The items listed above may not be a complete list  of foods and beverages to avoid. Contact your dietitian for more information. This information is not intended to replace advice given to you by your health care provider. Make sure you discuss any questions you have with your health care provider. Document Released: 11/06/2014 Document Revised: 11/28/2015 Document Reviewed: 07/17/2014 Elsevier Interactive Patient Education  2019 [employer].

## 2018-09-06 NOTE — Progress Notes (Signed)
cc'd to pcp 

## 2018-09-06 NOTE — Progress Notes (Signed)
Please make ov for fatty liver in six months.

## 2018-09-06 NOTE — Progress Notes (Signed)
PATIENT SCHEDULED  °

## 2018-09-06 NOTE — Assessment & Plan Note (Signed)
History of severe fatty liver on CT in 2014.  Patient reports recent labs unremarkable.  We will obtain a copy for review.  Discussed with patient, consider updating ultrasound in the next 1 year to look for any progression towards cirrhosis.  We will have her come back within the next year for follow-up as well.

## 2018-09-06 NOTE — Assessment & Plan Note (Signed)
Pleasant 68 year old female with history of positive Cologuard testing.  Mother had history of colon polyps.   Patient has never had a colonoscopy.  Recommend colonoscopy in the near future.  I have discussed the risks, alternatives, benefits with regards to but not limited to the risk of reaction to medication, bleeding, infection, perforation and the patient is agreeable to proceed. Written consent to be obtained.

## 2018-09-08 ENCOUNTER — Telehealth: Payer: Self-pay | Admitting: *Deleted

## 2018-09-08 ENCOUNTER — Telehealth: Payer: Self-pay | Admitting: Gastroenterology

## 2018-09-08 NOTE — Telephone Encounter (Signed)
Called pt, rx for prep was sent to [facility] as she had requested. [facility]fered [facility]. She said no that she would pick it up at [facility] this time but all future prescriptions would need to [facility]. Removed [facility] from pharmacy list.

## 2018-09-08 NOTE — Telephone Encounter (Signed)
Noted  

## 2018-09-08 NOTE — Telephone Encounter (Signed)
Pt requested any further prescriptions be called into [facility] in [place]. She is aware that her prep prescription was called into [facility] in [place] and we have a confirmed receipt that they received it.

## 2018-09-08 NOTE — Telephone Encounter (Signed)
Please call Melanie in endo and find out.

## 2018-09-08 NOTE — Telephone Encounter (Signed)
Per Bethany Cunningham she is taking a risk of getting burned. They can put tape on it and will still be taking the risk..  Called pt and she states she will take the risk

## 2018-09-08 NOTE — Telephone Encounter (Signed)
Patient called stating she read the instructions and it stated she must remove all jewelry. She has a "platinum diamond ring" on her finger that will not come off and she is not cutting it off either per pt. She stated we can "cut her finger off" but she prefers not to do that either! She wants to know if it can be "tapped" as this has been done in other procedures with no problem. Please advise LSL thanks

## 2018-09-12 ENCOUNTER — Ambulatory Visit: Payer: Medicare Other | Admitting: Endocrinology

## 2018-09-20 ENCOUNTER — Telehealth: Payer: Self-pay | Admitting: Gastroenterology

## 2018-09-20 NOTE — Telephone Encounter (Signed)
Bethany Cunningham, SHE IS STILL PLANNING ON HAVING HER PROCEDURE BUT WANTS TO KNOW IF PROTOCOL HAS CHANGED

## 2018-09-20 NOTE — Telephone Encounter (Signed)
Called and informed pt her procedure is as scheduled at this time.

## 2018-09-22 ENCOUNTER — Telehealth: Payer: Self-pay | Admitting: *Deleted

## 2018-09-22 NOTE — Telephone Encounter (Signed)
Called patient and made her aware on 3/25 she needs to arrive at the hospital at 8:30am, new procedure time is 9:30am. She will need to drink 2nd half of prep at 4:30am, npo after 6:30am. Patient voiced understanding.

## 2018-09-28 ENCOUNTER — Encounter (HOSPITAL_COMMUNITY)
Admission: RE | Disposition: A | Discharge status: Home / Self Care | Payer: Self-pay | Source: Home / Self Care | Attending: Gastroenterology

## 2018-09-28 ENCOUNTER — Ambulatory Visit (HOSPITAL_COMMUNITY)
Admission: RE | Admit: 2018-09-28 | Discharge: 2018-09-28 | Disposition: A | Discharge status: Home / Self Care | Payer: Medicare Other | Attending: Gastroenterology | Admitting: Gastroenterology

## 2018-09-28 ENCOUNTER — Other Ambulatory Visit: Payer: Self-pay

## 2018-09-28 ENCOUNTER — Encounter (HOSPITAL_COMMUNITY): Payer: Self-pay | Admitting: *Deleted

## 2018-09-28 DIAGNOSIS — Q439 Congenital malformation of intestine, unspecified: Secondary | ICD-10-CM | POA: Diagnosis not present

## 2018-09-28 DIAGNOSIS — K648 Other hemorrhoids: Secondary | ICD-10-CM | POA: Insufficient documentation

## 2018-09-28 DIAGNOSIS — K635 Polyp of colon: Secondary | ICD-10-CM | POA: Diagnosis not present

## 2018-09-28 DIAGNOSIS — Z8371 Family history of colonic polyps: Secondary | ICD-10-CM | POA: Insufficient documentation

## 2018-09-28 DIAGNOSIS — K5730 Diverticulosis of large intestine without perforation or abscess without bleeding: Secondary | ICD-10-CM | POA: Insufficient documentation

## 2018-09-28 DIAGNOSIS — R195 Other fecal abnormalities: Secondary | ICD-10-CM | POA: Diagnosis present

## 2018-09-28 DIAGNOSIS — K644 Residual hemorrhoidal skin tags: Secondary | ICD-10-CM | POA: Insufficient documentation

## 2018-09-28 DIAGNOSIS — Z79899 Other long term (current) drug therapy: Secondary | ICD-10-CM | POA: Insufficient documentation

## 2018-09-28 DIAGNOSIS — I10 Essential (primary) hypertension: Secondary | ICD-10-CM | POA: Insufficient documentation

## 2018-09-28 DIAGNOSIS — K621 Rectal polyp: Secondary | ICD-10-CM | POA: Insufficient documentation

## 2018-09-28 DIAGNOSIS — D125 Benign neoplasm of sigmoid colon: Secondary | ICD-10-CM

## 2018-09-28 DIAGNOSIS — E785 Hyperlipidemia, unspecified: Secondary | ICD-10-CM | POA: Diagnosis not present

## 2018-09-28 DIAGNOSIS — Z87891 Personal history of nicotine dependence: Secondary | ICD-10-CM | POA: Insufficient documentation

## 2018-09-28 HISTORY — PX: COLONOSCOPY: SHX5424

## 2018-09-28 HISTORY — PX: POLYPECTOMY: SHX5525

## 2018-09-28 SURGERY — COLONOSCOPY
Anesthesia: Moderate Sedation

## 2018-09-28 MED ORDER — ONDANSETRON HCL 4 MG/2ML IJ SOLN
INTRAMUSCULAR | Status: DC | PRN
  Administered 2018-09-28: 4 mg via INTRAVENOUS

## 2018-09-28 MED ORDER — SPOT INK MARKER SYRINGE KIT: PACK | SUBMUCOSAL | Status: AC

## 2018-09-28 MED ORDER — SODIUM CHLORIDE 0.9 % IV SOLN
INTRAVENOUS | Status: DC
  Administered 2018-09-28: 09:00:00 via INTRAVENOUS

## 2018-09-28 MED ORDER — MEPERIDINE HCL 100 MG/ML IJ SOLN
INTRAMUSCULAR | Status: AC
  Filled 2018-09-28: qty 2

## 2018-09-28 MED ORDER — MIDAZOLAM HCL 5 MG/5ML IJ SOLN
INTRAMUSCULAR | Status: DC | PRN
  Administered 2018-09-28: 1 mg via INTRAVENOUS
  Administered 2018-09-28: 2 mg via INTRAVENOUS
  Administered 2018-09-28 (×2): 1 mg via INTRAVENOUS

## 2018-09-28 MED ORDER — MIDAZOLAM HCL 5 MG/5ML IJ SOLN
INTRAMUSCULAR | Status: AC
  Filled 2018-09-28: qty 10

## 2018-09-28 MED ORDER — MEPERIDINE HCL 100 MG/ML IJ SOLN
INTRAMUSCULAR | Status: DC | PRN
  Administered 2018-09-28 (×2): 25 mg

## 2018-09-28 MED ORDER — SODIUM CHLORIDE (PF) 0.9 % IJ SOLN
PREFILLED_SYRINGE | INTRAMUSCULAR | Status: DC | PRN
  Administered 2018-09-28: 4 mL

## 2018-09-28 MED ORDER — ONDANSETRON HCL 4 MG/2ML IJ SOLN
INTRAMUSCULAR | Status: AC
  Filled 2018-09-28: qty 2

## 2018-09-28 MED ORDER — SPOT INK MARKER SYRINGE KIT
PACK | SUBMUCOSAL | Status: DC | PRN
  Administered 2018-09-28: 4 mL via SUBMUCOSAL

## 2018-09-28 MED ORDER — EPINEPHRINE PF 1 MG/10ML IJ SOSY
PREFILLED_SYRINGE | INTRAMUSCULAR | Status: AC
  Filled 2018-09-28: qty 10

## 2018-09-28 NOTE — Op Note (Signed)
[facility] Patient Name: Bethany Cunningham Procedure Date: 09/28/2018 9:25 AM MRN: [id number] Date of Birth: 08/14/1950 Attending MD: Barney Drain MD, MD CSN: [id number] Age: 68 Admit Type: Outpatient Procedure:                Colonoscopy-COLD SNARE POLYPECTOMY/EMR/EPI                            INJECTION/CLIPS x10 Indications:              Positive Cologuard test Providers:                Barney Drain MD, MD, Janeece Riggers, RN, Nelma Rothman,                            Technician Referring MD:             Edwinna Areola. [place] Crane MD Medicines:                Meperidine 50 mg IV, Midazolam 4 mg IV Complications:            No immediate complications. Estimated Blood Loss:     Estimated blood loss was minimal. Procedure:                Pre-Anesthesia Assessment:                           - Prior to the procedure, a History and Physical                            was performed, and patient medications and                            allergies were reviewed. The patient's tolerance of                            previous anesthesia was also reviewed. The risks                            and benefits of the procedure and the sedation                            options and risks were discussed with the patient.                            All questions were answered, and informed consent                            was obtained. Prior Anticoagulants: The patient has                            taken aspirin. ASA Grade Assessment: II - A patient                            with mild systemic disease. After reviewing the  risks and benefits, the patient was deemed in                            satisfactory condition to undergo the procedure.                            After obtaining informed consent, the colonoscope                            was passed under direct vision. Throughout the                            procedure, the patient's blood pressure, pulse, and             oxygen saturations were monitored continuously. The                            [id number]-[id number] ([id number]) scope was introduced through                            the anus and advanced to the 3 cm into the ileum.                            The colonoscopy was somewhat difficult due to a                            tortuous colon. Successful completion of the                            procedure was aided by increasing the dose of                            sedation medication, straightening and shortening                            the scope to obtain bowel loop reduction and                            COLOWRAP. The patient tolerated the procedure well.                            The quality of the bowel preparation was excellent.                            The terminal ileum, ileocecal valve, appendiceal                            orifice, and rectum were photographed. Scope In: 10:06:05 AM Scope Out: 11:32:09 AM Scope Withdrawal Time: 1 hour 24 minutes 32 seconds  Total Procedure Duration: 1 hour 26 minutes 4 seconds  Findings:      The terminal ileum appeared normal.      A 20 to 30 mm polyp was found in the cecum. The polyp was sessile. Area  was successfully injected with 4 mL ORISE for a lift polypectomy. The       polyp was removed with a cold snare(BTL 1). Resection and retrieval were       complete. Coagulation for bleeding prevention using snare was       successful. To prevent bleeding after the polypectomy, one hemostatic       clip was successfully placed (MR conditional). There was no bleeding at       the end of the procedure.      A 20 to 30 mm polyp was found in the hepatic flexure. The polyp was       sessile. Area was successfully injected with 2 mL ORISE for a lift       polypectomy. The polyp was removed with a cold snare(BTL 2). Resection       and retrieval were complete. To prevent bleeding after the polypectomy,       four hemostatic clips were  successfully placed (MR conditional). There       was no bleeding at the end of the procedure. Area was tattooed(#1) with       an injection of 1 mL of Spot (carbon black).      A 20 to 30 mm polyp was found in the mid transverse colon. The polyp was       sessile. Area was successfully injected with 1 mL ORISE for a lift       polypectomy. The polyp was removed with a cold snare(BTL 3). Resection       and retrieval were complete. Coagulation for bleeding prevention using       snare was successful. To prevent bleeding after the polypectomy, two       hemostatic clips were successfully placed (MR conditional). There was no       bleeding at the end of the procedure. Area was tattooed(#2) with an       injection of 1 mL of Spot (carbon black).      A 10 to 20 mm polyp was found in the distal transverse colon. The polyp       was semi-pedunculated. The polyp was removed with a hot snare(BTL 4).       Resection and retrieval were complete. To prevent bleeding after the       polypectomy, two hemostatic clips were successfully placed (MR       conditional). There was no bleeding at the end of the procedure. Area       was tattooed(#3) with an injection of 1 mL of Spot (carbon black).      A 20 to 30 mm polyp was found in the sigmoid colon. The polyp was       pedunculated. Area was successfully injected with 2 mL of a 1:10,000       solution of epinephrine for drug delivery. The polyp was removed with a       hot snare(BTL 5). Resection and retrieval were complete. To prevent       bleeding after the polypectomy, one hemostatic clip was successfully       placed (MR conditional). There was no bleeding at the end of the       procedure. Area was tattooed(#4) with an injection of 1 mL of Spot       (carbon black).      ELEVEN sessile polyps were found in the rectum, recto-sigmoid colon,       sigmoid colon and  proximal ascending colon. The polyps were 2 to 6 mm in       size. These polyps were  removed with a cold snare. Resection and       retrieval were complete.      Multiple small and large-mouthed diverticula were found in the       recto-sigmoid colon, sigmoid colon, transverse colon and ascending colon.      External and internal hemorrhoids were found.      The recto-sigmoid colon and sigmoid colon were mildly tortuous. Impression:               - The examined portion of the ileum was normal.                           - One 2 to 3 mm polyp in the cecum, removed with a                            cold snare. Resected and retrieved. Injected.                            Treated with a hot snare. Clip (MR conditional) was                            placed.                           - One 2 to 4 mm polyp at the hepatic flexure,                            removed with a cold snare. Resected and retrieved.                            Injected. Clips (MR conditional) were placed.                            Tattooed.                           - One 2 to 3 mm polyp in the mid transverse colon,                            removed with a cold snare. Resected and retrieved.                            Injected. Treated with a hot snare. Clips (MR                            conditional) were placed. Tattooed.                           - One 1 to 2 mm polyp in the distal transverse                            colon, removed with a hot snare. Resected and  retrieved. Clips (MR conditional) were placed.                            Tattooed.                           - One 2 to 3 mm polyp in the sigmoid colon, removed                            with a hot snare. Resected and retrieved. Injected.                            Clip (MR conditional) was placed. Tattooed.                           - ELEVEN 2 to 6 mm polyps in the rectum, at the                            recto-sigmoid colon, in the sigmoid colon and in                            the proximal ascending colon,  removed with a cold                            snare. Resected and retrieved.                           - Diverticulosis in the recto-sigmoid colon, in the                            sigmoid colon, in the transverse colon and in the                            ascending colon.                           - External and internal hemorrhoids.                           - Tortuous colon. Moderate Sedation:      Moderate (conscious) sedation was administered by the endoscopy nurse       and supervised by the endoscopist. The following parameters were       monitored: oxygen saturation, heart rate, blood pressure, and response       to care. Total physician intraservice time was 106 minutes. Recommendation:           - Patient has a contact number available for                            emergencies. The signs and symptoms of potential                            delayed complications were discussed with the  patient. Return to normal activities tomorrow.                            Written discharge instructions were provided to the                            patient.                           - High fiber diet. LOSE WIEGHT TO BMI < 30.                           - Continue present medications.                           - Await pathology results.                           - Repeat colonoscopy 1 YEAR IF ADVANCED CHANGES AND                            3 YEARS IF SIMPLE ADENOMAS for surveillance. Procedure Code(s):        --- Professional ---                           [phone], Colonoscopy, flexible; with removal of                            tumor(s), polyp(s), or other lesion(s) by snare                            technique                           45381, Colonoscopy, flexible; with directed                            submucosal injection(s), any substance                           99153, Moderate sedation; each additional 15                            minutes intraservice  time                           99153, Moderate sedation; each additional 15                            minutes intraservice time                           99153, Moderate sedation; each additional 15                            minutes intraservice time  83382, Moderate sedation; each additional 15                            minutes intraservice time                           99153, Moderate sedation; each additional 15                            minutes intraservice time                           G0500, Moderate sedation services provided by the                            same physician or other qualified health care                            professional performing a gastrointestinal                            endoscopic service that sedation supports,                            requiring the presence of an independent trained                            observer to assist in the monitoring of the                            patient's level of consciousness and physiological                            status; initial 15 minutes of intra-service time;                            patient age 90 years or older (additional time may                            be reported with [phone], as appropriate) Diagnosis Code(s):        --- Professional ---                           K64.8, Other hemorrhoids                           D12.0, Benign neoplasm of cecum                           D12.3, Benign neoplasm of transverse colon (hepatic                            flexure or splenic flexure)                           D12.5, Benign neoplasm of sigmoid  colon                           K62.1, Rectal polyp                           D12.7, Benign neoplasm of rectosigmoid junction                           D12.2, Benign neoplasm of ascending colon                           R19.5, Other fecal abnormalities                           K57.30, Diverticulosis of large intestine without                             perforation or abscess without bleeding                           Q43.8, Other specified congenital malformations of                            intestine CPT copyright 2018 [employer]. All rights reserved. The codes documented in this report are preliminary and upon coder review may  be revised to meet current compliance requirements. Barney Drain, MD Barney Drain MD, MD 09/28/2018 12:07:30 PM This report has been signed electronically. Number of Addenda: 0

## 2018-09-28 NOTE — H&P (Signed)
Primary Care Physician:  Celene Squibb, MD Primary Gastroenterologist:  Dr. Oneida Alar  Pre-Procedure History & Physical: HPI:  Bethany Cunningham is a 68 y.o. female here for POS COLOGUARD TEST/MOTHER HAD POLYPS.   Past Medical History:  Diagnosis Date  . Diverticulosis   . Dyslipidemia   . HTN (hypertension)   . Hypothyroid   . Skin disorder     Past Surgical History:  Procedure Laterality Date  . ABDOMINAL HYSTERECTOMY    . CHOLECYSTECTOMY    . TONSILLECTOMY      Prior to Admission medications   Medication Sig Start Date End Date Taking? Authorizing Provider  CRANBERRY CONCENTRATE PO Take 1 tablet by mouth once a week.   Yes [provider]  losartan (COZAAR) 50 MG tablet Take 50 mg by mouth daily.   Yes [provider]  Na Sulfate-K Sulfate-Mg Sulf 17.5-3.13-1.6 GM/177ML SOLN Take 1 kit by mouth as directed. 09/06/18  Yes Zarinah Oviatt, Marga Melnick, MD  Aspirin-Acetaminophen (GOODYS BODY PAIN PO) Take 1 Package by mouth daily as needed (pain/headache).    [provider]    Allergies as of 09/06/2018  . (No Known Allergies)    Family History  Problem Relation Age of Onset  . AAA (abdominal aortic aneurysm) Mother   . Colon polyps Mother   . Lung cancer Father   . Colon cancer Neg Hx   . Inflammatory bowel disease Neg Hx     Social History   Socioeconomic History  . Marital status: Single    Spouse name: Not on file  . Number of children: 0  . Years of education: Not on file  . Highest education level: Not on file  Occupational History  . Occupation: retired Product manager: RETIRED  Social Needs  . Financial resource strain: Not on file  . Food insecurity:    Worry: Not on file    Inability: Not on file  . Transportation needs:    Medical: Not on file    Non-medical: Not on file  Tobacco Use  . Smoking status: Former Smoker    Last attempt to quit: 07/06/2001    Years since quitting: 17.2  . Smokeless tobacco: Never Used  . Tobacco  comment: Former smoker/ quit x 8 years  Substance and Sexual Activity  . Alcohol use: Yes    Comment: occas  . Drug use: No  . Sexual activity: Not on file  Lifestyle  . Physical activity:    Days per week: Not on file    Minutes per session: Not on file  . Stress: Not on file  Relationships  . Social connections:    Talks on phone: Not on file    Gets together: Not on file    Attends religious service: Not on file    Active member of club or organization: Not on file    Attends meetings of clubs or organizations: Not on file    Relationship status: Not on file  . Intimate partner violence:    Fear of current or ex partner: Not on file    Emotionally abused: Not on file    Physically abused: Not on file    Forced sexual activity: Not on file  Other Topics Concern  . Not on file  Social History Narrative  . Not on file    Review of Systems: See HPI, otherwise negative ROS   Physical Exam: BP 139/64   Pulse 83   Temp 97.6 F (36.4 C) (  Oral)   Resp 20   Ht 5' 2"  (1.575 m)   Wt 104.3 kg   SpO2 94%   BMI 42.07 kg/m  General:   Alert,  pleasant and cooperative in NAD Head:  Normocephalic and atraumatic. Neck:  Supple; Lungs:  Clear throughout to auscultation.    Heart:  Regular rate and rhythm. Abdomen:  Soft, nontender and nondistended. Normal bowel sounds, without guarding, and without rebound.   Neurologic:  Alert and  oriented x4;  grossly normal neurologically.  Impression/Plan:     POS COLOGUARD TEST/MOTHER HAD POLYPS.   PLAN: 1. TCS TODAY.  DISCUSSED PROCEDURE, BENEFITS, & RISKS: < 1% chance of medication reaction, bleeding, perforation, ASPIRATION, or rupture of spleen/liver requiring surgery to fix it and missed polyps < 1 cm 10-20% of the time.

## 2018-09-28 NOTE — Discharge Instructions (Signed)
You have small internal hemorrhoids and diverticulosis IN YOUR LEFT AND RIGHT COLON. YOU HAD SIXTEEN POLYPS REMOVED. FIVE WERE LARGE AND I TATTOOED NEAR THE BASE OF FOUR OF THE POLYPS. I PLACED TEN CLIPS TO PREVENT BLEEDING IN 7-10 DAYS.    IF YOU NEED AN MRI, LET THE RADIOLOGY TECH KNOW THAT YOU HAD TEN CLIPS PLACED IN YOUR COLON. THEY SHOULD FALL OFF IN 30 DAYS.  DRINK WATER TO KEEP YOUR URINE LIGHT YELLOW.  CONTINUE YOUR WEIGHT LOSS EFFORTS. YOUR BODY MASS INDEX IS OVER 40 WHICH MEANS YOU ARE MORBIDLY OBESE. OBESITY IS ASSOCIATED WITH AN INCREASED FOR CIRRHOSIS AND ALL CANCERS, INCLUDING ESOPHAGEAL AND COLON CANCER. A WEIGHT OF 215 LBS OR LESS WILL GET YOUR BODY MASS INDEX(BMI) UNDER 40 BUT YOUR BODY MASS INDEX WILL STILL BE OVER 30 WHICH MEANS YOU ARE OBESE.  A WEIGHT OF 160 LBS OR LESS  WILL GET YOUR BODY MASS INDEX(BMI) UNDER 30. IF YOU TRY AND CANNOT LOSE WEIGHT PLEASE CALL FOR A REFERRAL TO THE BARIATRIC WEIGHT LOSS CLINIC.  FOLLOW A HIGH FIBER DIET. AVOID ITEMS THAT CAUSE BLOATING. See info below.  YOUR BIOPSY RESULTS WILL BE BACK IN 5 BUSINESS DAYS.  USE PREPARATION H FOUR TIMES  A DAY IF NEEDED TO RELIEVE RECTAL PAIN/PRESSURE/BLEEDING.  Next colonoscopy in 1 YEAR IF ADVANCED CHANGES AND 3 YEARS IF THE POLYPS ARE SIMPLE ADENOMAS.  Colonoscopy Care After Read the instructions outlined below and refer to this sheet in the next week. These discharge instructions provide you with general information on caring for yourself after you leave the hospital. While your treatment has been planned according to the most current medical practices available, unavoidable complications occasionally occur. If you have any problems or questions after discharge, call DR. Arletha Marschke, [phone].  ACTIVITY  You may resume your regular activity, but move at a slower pace for the next 24 hours.   Take frequent rest periods for the next 24 hours.   Walking will help get rid of the air and reduce the bloated  feeling in your belly (abdomen).   No driving for 24 hours (because of the medicine (anesthesia) used during the test).   You may shower.   Do not sign any important legal documents or operate any machinery for 24 hours (because of the anesthesia used during the test).    NUTRITION  Drink plenty of fluids.   You may resume your normal diet as instructed by your doctor.   Begin with a light meal and progress to your normal diet. Heavy or fried foods are harder to digest and may make you feel sick to your stomach (nauseated).   Avoid alcoholic beverages for 24 hours or as instructed.    MEDICATIONS  You may resume your normal medications.   WHAT YOU CAN EXPECT TODAY  Some feelings of bloating in the abdomen.   Passage of more gas than usual.   Spotting of blood in your stool or on the toilet paper  .  IF YOU HAD POLYPS REMOVED DURING THE COLONOSCOPY:  Eat a soft diet IF YOU HAVE NAUSEA, BLOATING, ABDOMINAL PAIN, OR VOMITING.    FINDING OUT THE RESULTS OF YOUR TEST Not all test results are available during your visit. DR. Oneida Alar WILL CALL YOU WITHIN 14 DAYS OF YOUR PROCEDUE WITH YOUR RESULTS. Do not assume everything is normal if you have not heard from DR. Gregori Abril, CALL HER OFFICE AT [phone].  SEEK IMMEDIATE MEDICAL ATTENTION AND CALL THE OFFICE: [phone] IF:  You have more than a spotting of blood in your stool.   Your belly is swollen (abdominal distention).   You are nauseated or vomiting.   You have a temperature over 101F.   You have abdominal pain or discomfort that is severe or gets worse throughout the day.  High-Fiber Diet A high-fiber diet changes your normal diet to include more whole grains, legumes, fruits, and vegetables. Changes in the diet involve replacing refined carbohydrates with unrefined foods. The calorie level of the diet is essentially unchanged. The Dietary Reference Intake (recommended amount) for adult males is 38 grams per day.  For adult females, it is 25 grams per day. Pregnant and lactating women should consume 28 grams of fiber per day. Fiber is the intact part of a plant that is not broken down during digestion. Functional fiber is fiber that has been isolated from the plant to provide a beneficial effect in the body.  PURPOSE  Increase stool bulk.   Ease and regulate bowel movements.   Lower cholesterol.   REDUCE RISK OF COLON CANCER  INDICATIONS THAT YOU NEED MORE FIBER  Constipation and hemorrhoids.   Uncomplicated diverticulosis (intestine condition) and irritable bowel syndrome.   Weight management.   As a protective measure against hardening of the arteries (atherosclerosis), diabetes, and cancer.   GUIDELINES FOR INCREASING FIBER IN THE DIET  Start adding fiber to the diet slowly. A gradual increase of about 5 more grams (2 slices of whole-wheat bread, 2 servings of most fruits or vegetables, or 1 bowl of high-fiber cereal) per day is best. Too rapid an increase in fiber may result in constipation, flatulence, and bloating.   Drink enough water and fluids to keep your urine clear or pale yellow. Water, juice, or caffeine-free drinks are recommended. Not drinking enough fluid may cause constipation.   Eat a variety of high-fiber foods rather than one type of fiber.   Try to increase your intake of fiber through using high-fiber foods rather than fiber pills or supplements that contain small amounts of fiber.   The goal is to change the types of food eaten. Do not supplement your present diet with high-fiber foods, but replace foods in your present diet.   INCLUDE A VARIETY OF FIBER SOURCES  Replace refined and processed grains with whole grains, canned fruits with fresh fruits, and incorporate other fiber sources. White rice, white breads, and most bakery goods contain little or no fiber.   Brown whole-grain rice, buckwheat oats, and many fruits and vegetables are all good sources of fiber.  These include: broccoli, Brussels sprouts, cabbage, cauliflower, beets, sweet potatoes, white potatoes (skin on), carrots, tomatoes, eggplant, squash, berries, fresh fruits, and dried fruits.   Cereals appear to be the richest source of fiber. Cereal fiber is found in whole grains and bran. Bran is the fiber-rich outer coat of cereal grain, which is largely removed in refining. In whole-grain cereals, the bran remains. In breakfast cereals, the largest amount of fiber is found in those with "bran" in their names. The fiber content is sometimes indicated on the label.   You may need to include additional fruits and vegetables each day.   In baking, for 1 cup white flour, you may use the following substitutions:   1 cup whole-wheat flour minus 2 tablespoons.   1/2 cup white flour plus 1/2 cup whole-wheat flour.   Polyps, Colon  A polyp is extra tissue that grows inside your body. Colon polyps grow in the large intestine.  The large intestine, also called the colon, is part of your digestive system. It is a long, hollow tube at the end of your digestive tract where your body makes and stores stool. Most polyps are not dangerous. They are benign. This means they are not cancerous. But over time, some types of polyps can turn into cancer. Polyps that are smaller than a pea are usually not harmful. But larger polyps could someday become or may already be cancerous. To be safe, doctors remove all polyps and test them.   PREVENTION There is not one sure way to prevent polyps. You might be able to lower your risk of getting them if you:  Eat more fruits and vegetables and less fatty food.   Do not smoke.   Avoid alcohol.   Exercise every day.   Lose weight if you are overweight.   Eating more calcium and folate can also lower your risk of getting polyps. Some foods that are rich in calcium are milk, cheese, and broccoli. Some foods that are rich in folate are chickpeas, kidney beans, and spinach.      Diverticulosis Diverticulosis is a common condition that develops when small pouches (diverticula) form in the wall of the colon. The risk of diverticulosis increases with age. It happens more often in people who eat a low-fiber diet. Most individuals with diverticulosis have no symptoms. Those individuals with symptoms usually experience belly (abdominal) pain, constipation, or loose stools (diarrhea).  HOME CARE INSTRUCTIONS  Increase the amount of fiber in your diet as directed by your caregiver or dietician. This may reduce symptoms of diverticulosis.   Drink at least 6 to 8 glasses of water each day to prevent constipation.   Try not to strain when you have a bowel movement.   Avoiding nuts and seeds to prevent complications is NOT NECESSARY.   FOODS HAVING HIGH FIBER CONTENT INCLUDE:  Fruits. Apple, peach, pear, tangerine, raisins, prunes.   Vegetables. Brussels sprouts, asparagus, broccoli, cabbage, carrot, cauliflower, romaine lettuce, spinach, summer squash, tomato, winter squash, zucchini.   Starchy Vegetables. Baked beans, kidney beans, lima beans, split peas, lentils, potatoes (with skin).   Grains. Whole wheat bread, brown rice, bran flake cereal, plain oatmeal, white rice, shredded wheat, bran muffins.   SEEK IMMEDIATE MEDICAL CARE IF:  You develop increasing pain or severe bloating.   You have an oral temperature above 101F.   You develop vomiting or bowel movements that are bloody or black.

## 2018-09-29 ENCOUNTER — Telehealth: Payer: Self-pay | Admitting: Gastroenterology

## 2018-09-29 NOTE — Telephone Encounter (Signed)
LMOM to call.

## 2018-09-29 NOTE — Telephone Encounter (Signed)
PLEASE CALL PT. SHE HAD SEVEN SERRATED POLYPS, ONE SIMPLE ADENOMA, AND EIGHT HYPERPLASTIC WERE removed.    IF YOU NEED AN MRI, LET THE RADIOLOGY TECH KNOW THAT YOU HAD TEN CLIPS PLACED IN YOUR COLON. THEY SHOULD FALL OFF IN 30 DAYS.  DRINK WATER TO KEEP YOUR URINE LIGHT YELLOW.  CONTINUE YOUR WEIGHT LOSS EFFORTS. A WEIGHT OF 215 LBS OR LESS WILL GET YOUR BODY MASS INDEX(BMI) UNDER 40 BUT YOUR BODY MASS INDEX WILL STILL BE OVER 30 WHICH MEANS YOU ARE OBESE.  A WEIGHT OF 160 LBS OR LESS  WILL GET YOUR BODY MASS INDEX(BMI) UNDER 30. IF YOU TRY AND CANNOT LOSE WEIGHT PLEASE CALL FOR A REFERRAL TO THE BARIATRIC WEIGHT LOSS CLINIC.  FOLLOW A HIGH FIBER DIET. AVOID ITEMS THAT CAUSE BLOATING.   USE PREPARATION H FOUR TIMES  A DAY IF NEEDED TO RELIEVE RECTAL PAIN/PRESSURE/BLEEDING.  Next colonoscopy in 3 YEARS.

## 2018-09-29 NOTE — Telephone Encounter (Signed)
Pt is aware. She said to tell Dr. Oneida Alar that she really did like her.

## 2018-09-30 ENCOUNTER — Encounter (HOSPITAL_COMMUNITY): Payer: Self-pay | Admitting: Gastroenterology

## 2018-09-30 NOTE — Telephone Encounter (Signed)
Reminder in epic °

## 2018-09-30 NOTE — Telephone Encounter (Signed)
REVIEWED-NO ADDITIONAL RECOMMENDATIONS. 

## 2018-10-31 ENCOUNTER — Ambulatory Visit: Payer: Medicare Other | Admitting: Internal Medicine

## 2019-02-19 IMAGING — DX DG FINGER RING 2+V*R*
3 series · 3 of 3 positions shown · non-contrast
Comparison: None.

CLINICAL DATA: Crush injury to finger today. Finger pain and
bruising. Initial encounter.

EXAM:
RIGHT RING FINGER 2+V

[finger ap]
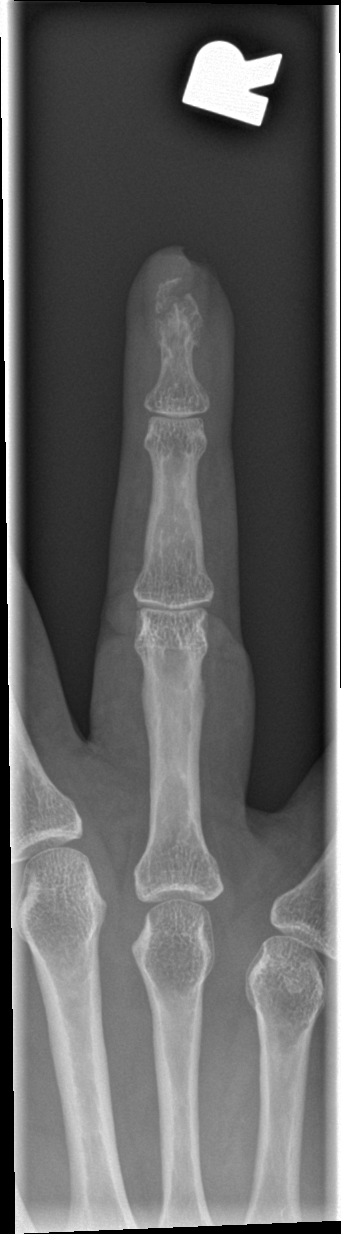

[finger obl]
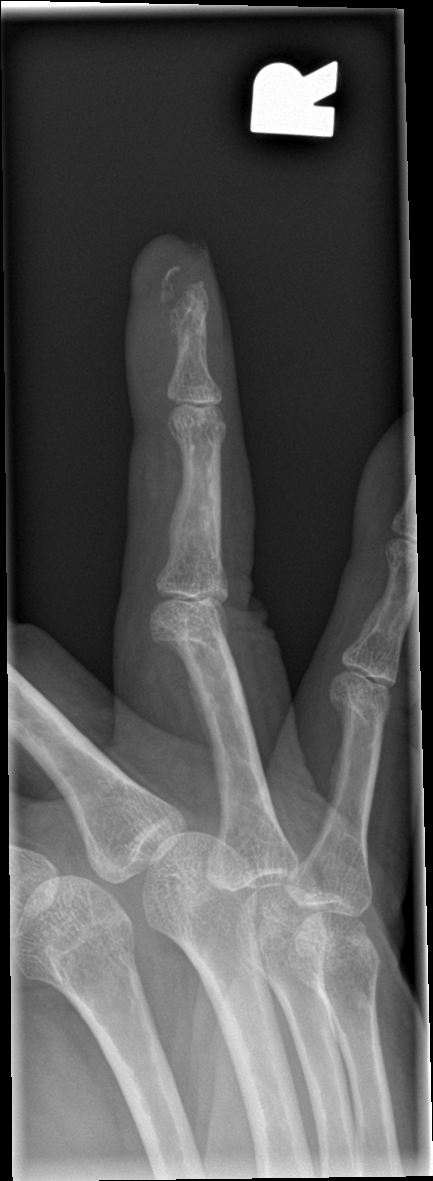

[finger lat]
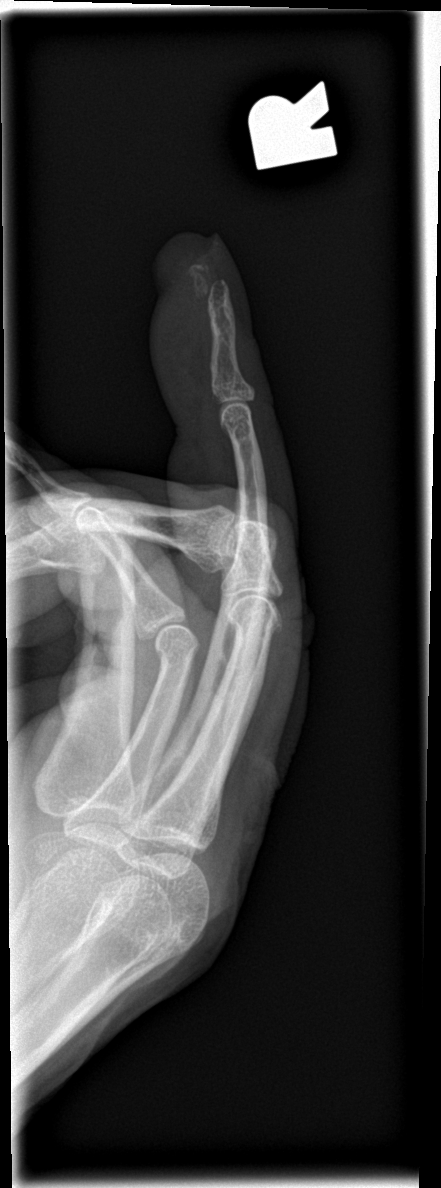

[3 of 3 positions shown; findings below may reference images not displayed]

FINDINGS: Displaced fracture through the terminal tuft of the distal phalanx
is seen. No evidence of dislocation.
IMPRESSION: Displaced fracture through terminal tuft of distal phalanx.

## 2019-03-07 ENCOUNTER — Ambulatory Visit: Payer: Medicare Other | Admitting: Gastroenterology

## 2019-03-09 ENCOUNTER — Ambulatory Visit: Payer: Medicare Other | Admitting: Gastroenterology

## 2019-03-10 ENCOUNTER — Other Ambulatory Visit: Payer: Self-pay

## 2019-03-15 ENCOUNTER — Other Ambulatory Visit: Payer: Self-pay

## 2019-03-15 ENCOUNTER — Ambulatory Visit: Payer: Medicare Other | Admitting: Internal Medicine

## 2019-03-15 ENCOUNTER — Encounter: Payer: Self-pay | Admitting: Internal Medicine

## 2019-03-15 VITALS — BP 120/88 | HR 79 | Ht 62.0 in | Wt 233.0 lb

## 2019-03-15 DIAGNOSIS — E039 Hypothyroidism, unspecified: Secondary | ICD-10-CM | POA: Diagnosis not present

## 2019-03-15 DIAGNOSIS — E0500 Thyrotoxicosis with diffuse goiter without thyrotoxic crisis or storm: Secondary | ICD-10-CM

## 2019-03-15 LAB — T3, FREE: T3, Free: 3.4 pg/mL (ref 2.3–4.2)

## 2019-03-15 LAB — T4, FREE: Free T4: 0.66 ng/dL (ref 0.60–1.60)

## 2019-03-15 LAB — TSH: TSH: 5.38 u[IU]/mL — ABNORMAL HIGH (ref 0.35–4.50)

## 2019-03-15 NOTE — Progress Notes (Signed)
Patient ID: Bethany Cunningham, female   DOB: [birth year], 68 y.o.   MRN: [id number]    HPI  Bethany Cunningham is a 68 y.o.-year-old female, returning for f/u for Graves ds (previous h/o hypothyroidism). Last visit 6 months ago.  Reviewed and addended history: Pt. has been dx with hypothyroidism in approximately 2014 >> on Levothyroxine 112 mcg >> TSH decreased >> dose of LT4 repeatedly decreased >> stopped 05-12/2015. Despite this, TSH remained suppressed.  03/06/2016: TSH 0.07, free T4 1.1 (0.8-1.8) 01/30/2016: TSH 0.11, free T4 0.9  Patient had a thyroid ultrasound on 03/02/2016 that showed a heterogeneous thyroid, without nodules.  Her Graves' antibodies were elevated (TSI's): Lab Results  Component Value Date   TSI 446 (H) 03/11/2018   TSI 507 (H) 05/01/2016   We initially started methimazole 5 mg daily and decrease the dose to 2.5 mg daily in 06/2016.  She was able to come off methimazole in 08/2016.    However, her TSH increased again and free T4 decreased to lower than normal.  We started low-dose levothyroxine, 25 mcg daily, but subsequent TFTs have been thyrotoxic so we ended up stopping levothyroxine 01/24/2018.  Reviewed her TFTs: 11/2018: TSH normal reportedly Lab Results  Component Value Date   TSH 0.37 06/24/2018   TSH 0.50 03/11/2018   TSH 1.06 01/24/2018   TSH 0.25 (L) 12/15/2017   TSH 0.32 (L) 10/21/2017   TSH 0.55 03/24/2017   TSH 3.69 01/15/2017   TSH 2.25 12/09/2016   TSH 0.93 10/07/2016   TSH 6.64 (H) 08/31/2016   FREET4 0.56 (L) 06/24/2018   FREET4 0.53 (L) 03/11/2018   FREET4 0.64 01/24/2018   FREET4 0.73 12/15/2017   FREET4 0.63 10/21/2017   FREET4 0.76 03/24/2017   FREET4 0.49 (L) 01/15/2017   FREET4 0.55 (L) 12/09/2016   FREET4 0.59 (L) 10/07/2016   FREET4 0.51 (L) 08/31/2016   T3FREE 4.1 06/24/2018   T3FREE 3.7 03/11/2018   T3FREE 3.9 01/24/2018   T3FREE 3.7 03/24/2017   T3FREE 3.9 01/15/2017   T3FREE 3.5 12/09/2016   T3FREE 4.5 (H)  10/07/2016   T3FREE 3.9 08/31/2016   T3FREE 3.6 07/02/2016   T3FREE 4.7 (H) 05/01/2016   Pt denies: - feeling nodules in neck - hoarseness - dysphagia - choking - SOB with lying down  She has + FH of thyroid disorders in: mother and sister (both with Graves ds. - s/p RAI tx). No FH of thyroid cancer. No h/o radiation tx to head or neck.  No seaweed or kelp. No recent contrast studies. No herbal supplements. No Biotin use. No recent steroids use except cortisone cream.   Pt. also has a history of HTN, HL, TAH, cholecystectomy.   Constitutional: no weight gain/no weight loss, no fatigue, + subjective hyperthermia (chronic), no subjective hypothermia Eyes: + blurry vision in am, no xerophthalmia ENT: no sore throat, + see HPI Cardiovascular: no CP/no SOB/no palpitations/no leg swelling Respiratory: no cough/no SOB/no wheezing Gastrointestinal: no N/no V/no D/no C/no acid reflux Musculoskeletal: no muscle aches/no joint aches Skin: no rashes, no hair loss Neurological: no tremors/no numbness/no tingling/no dizziness  I reviewed pt's medications, allergies, PMH, social hx, family hx, and changes were documented in the history of present illness. Otherwise, unchanged from my initial visit note.  Past Medical History:  Diagnosis Date  . Diverticulosis   . Dyslipidemia   . HTN (hypertension)   . Hypothyroid   . Skin disorder    Past Surgical History:  Procedure Laterality Date  .  ABDOMINAL HYSTERECTOMY    . CHOLECYSTECTOMY    . COLONOSCOPY N/A 09/28/2018   Procedure: COLONOSCOPY;  Surgeon: Danie Binder, MD;  Location: [facility];  Service: Endoscopy;  Laterality: N/A;  2:00pm  . POLYPECTOMY  09/28/2018   Procedure: POLYPECTOMY;  Surgeon: Danie Binder, MD;  Location: [facility];  Service: Endoscopy;;  . TONSILLECTOMY     Social History   Social History  . Marital status: Single    Spouse name: N/A  . Number of children: 0   Occupational History  . retired  Pharmacist, hospital Retired   Social History Main Topics  . Smoking status: former Smoker - quit 2003  . Smokeless tobacco: No       . Alcohol use No  . Drug use: No   Current Outpatient Medications on File Prior to Visit  Medication Sig Dispense Refill  . Aspirin-Acetaminophen (GOODYS BODY PAIN PO) Take 1 Package by mouth daily as needed (pain/headache).    Drusilla Kanner CONCENTRATE PO Take 1 tablet by mouth once a week.    . losartan (COZAAR) 50 MG tablet Take 50 mg by mouth daily.     No current facility-administered medications on file prior to visit.    No Known Allergies Family History  Problem Relation Age of Onset  . AAA (abdominal aortic aneurysm) Mother   . Colon polyps Mother   . Lung cancer Father   . Colon cancer Neg Hx   . Inflammatory bowel disease Neg Hx     PE: BP 120/88   Pulse 79   Ht 5\' 2"  (1.575 m)   Wt 233 lb (105.7 kg)   SpO2 96%   BMI 42.62 kg/m  Wt Readings from Last 3 Encounters:  03/14/69 233 lb (105.7 kg)  09/28/18 230 lb (104.3 kg)  09/06/18 236 lb (107 kg)   Constitutional: overweight, in NAD Eyes: PERRLA, EOMI, no exophthalmos ENT: moist mucous membranes, no thyromegaly, no cervical lymphadenopathy Cardiovascular: RRR, No RG, +1/6 SEM Respiratory: CTA B Gastrointestinal: abdomen soft, NT, ND, BS+ Musculoskeletal: no deformities, strength intact in all 4 Skin: moist, warm, no rashes Neurological: no tremor with outstretched hands, DTR normal in all 4  ASSESSMENT: 1. Graves ds.  PLAN:  1.  Patient with an unusual history of hypothyroidism followed by thyrotoxicity confirmed as Graves' disease based on elevated TSI antibodies, absence of nodules on the thyroid ultrasound and protracted course.  She was on methimazole and did well on this and was finally able to come off in 08/2016.  Her TFTs trended towards hypothyroidism again, therefore, we started levothyroxine low-dose (25 mcg daily).  However, her TSH started to decrease soon afterwards and we  ended up stopping levothyroxine in 01/2018.  -At last visit, her TFTs were normal and TSI antibodies were still elevated. She had another set of TFTs checked by PCP on 05/24/2018 and her TSH was slightly suppressed (0.272).  I advised her to come back for another repeat in 1 month but did not start methimazole or levothyroxine at that time.  In 06/2018, a TSH returned normal (0.37). She also had normal TFTs in 11/2018 (I will need to obtain records) -We will repeat her TFTs today and add TSIs (we discussed why these are useful) -No signs or symptoms of hyperthyroidism today-she appears euthyroid -We discussed that if she needs to start levothyroxine, she needs to take it daily, with water, more than 30 minutes before breakfast and separated by more than 4 hours from acid reflux medicines,  calcium, iron, multivitamins. - If labs are abnormal today, she will need to return for repeat thyroid test in 1.5 months - Otherwise, I will see her back in 6 months - I advised her to establish care with an ophthalmologist >> given contact info for Dr. Prudencio Burly. She describes some blurry vision but no other signs or sxs of Graves ophthalmopathy  - time spent with the patient: 15 minutes, of which >50% was spent in obtaining information about her symptoms, reviewing her previous labs, evaluations, and treatments, counseling her about her condition (please see the discussed topics above), and developing a plan to further investigate and treat it; she had a number of questions which I addressed.  Component     Latest Ref Rng & Units 03/15/2019  TSH     0.35 - 4.50 uIU/mL 5.38 (H)  T4,Free(Direct)     0.60 - 1.60 ng/dL 0.66  Triiodothyronine,Free,Serum     2.3 - 4.2 pg/mL 3.4  TSI     <140 % baseline 418 (H)   TSI antibodies are still high, but slightly improved.  TSH is slightly above the upper limit of normal.  Due to the previous variability in her TFTs, I would like to hold off treatment for now but repeat the  test in 1.5 months.   Philemon Kingdom, MD PhD [facility] Endocrinology

## 2019-03-15 NOTE — Patient Instructions (Addendum)
Please stop at the lab.  Please call and schedule an eye appt with Dr. Prudencio Burly: [facility]:  Dr. Sherlyn Lick MD ?  Address: [address], [place], [place] [address]  [phone]  Please come back for a follow-up appointment in 6 months.

## 2019-03-20 LAB — THYROID STIMULATING IMMUNOGLOBULIN: TSI: 418 % baseline — ABNORMAL HIGH (ref <140)

## 2019-04-26 ENCOUNTER — Other Ambulatory Visit (INDEPENDENT_AMBULATORY_CARE_PROVIDER_SITE_OTHER): Payer: Medicare Other

## 2019-04-26 ENCOUNTER — Other Ambulatory Visit: Payer: Self-pay

## 2019-04-26 DIAGNOSIS — E0500 Thyrotoxicosis with diffuse goiter without thyrotoxic crisis or storm: Secondary | ICD-10-CM | POA: Diagnosis not present

## 2019-04-26 DIAGNOSIS — E039 Hypothyroidism, unspecified: Secondary | ICD-10-CM

## 2019-04-26 LAB — TSH: TSH: 6.16 u[IU]/mL — ABNORMAL HIGH (ref 0.35–4.50)

## 2019-04-26 LAB — T4, FREE: Free T4: 0.58 ng/dL — ABNORMAL LOW (ref 0.60–1.60)

## 2019-04-26 LAB — T3, FREE: T3, Free: 3.4 pg/mL (ref 2.3–4.2)

## 2019-04-28 ENCOUNTER — Telehealth: Payer: Self-pay

## 2019-04-28 DIAGNOSIS — E039 Hypothyroidism, unspecified: Secondary | ICD-10-CM

## 2019-04-28 DIAGNOSIS — E0500 Thyrotoxicosis with diffuse goiter without thyrotoxic crisis or storm: Secondary | ICD-10-CM

## 2019-04-28 DIAGNOSIS — E0590 Thyrotoxicosis, unspecified without thyrotoxic crisis or storm: Secondary | ICD-10-CM

## 2019-04-28 NOTE — Telephone Encounter (Signed)
-----   Message from Philemon Kingdom, MD sent at 04/28/2019 12:21 PM EDT ----- [name], can you please call pt: Patient's TSH is slightly higher than before, above the upper limit of normal.  Free T4 is now also low.  At this point, I would suggest we start levothyroxine, but probably at a lower dose compared to what she was using in the past, 12.5 mcg daily.  She will need to cut the 25 mcg tablet in half.  If she agrees, can you send 30 tablets to the pharmacy with 3 refills?  She will then needs to come back for labs in 1.5 months.  Can you please order TSH and free T4?

## 2019-05-01 MED ORDER — LEVOTHYROXINE SODIUM 25 MCG PO TABS: 12.5000 ug | ORAL_TABLET | Freq: Every day | ORAL | 3 refills | Status: DC

## 2019-05-01 NOTE — Telephone Encounter (Signed)
Notified patient of message from Dr. Cruzita Lederer, patient expressed understanding and agreement. No further questions.  RX sent and labs ordered.

## 2019-05-01 NOTE — Telephone Encounter (Signed)
Left message for patient to return our call at [phone].  

## 2019-05-11 ENCOUNTER — Other Ambulatory Visit (HOSPITAL_COMMUNITY): Payer: Self-pay | Admitting: Internal Medicine

## 2019-05-11 DIAGNOSIS — Z1231 Encounter for screening mammogram for malignant neoplasm of breast: Secondary | ICD-10-CM

## 2019-05-18 ENCOUNTER — Ambulatory Visit (HOSPITAL_COMMUNITY): Payer: Medicare Other

## 2019-06-08 ENCOUNTER — Ambulatory Visit (HOSPITAL_COMMUNITY): Payer: Medicare Other

## 2019-06-09 ENCOUNTER — Other Ambulatory Visit: Payer: Self-pay

## 2019-06-09 ENCOUNTER — Ambulatory Visit (HOSPITAL_COMMUNITY)
Admission: RE | Admit: 2019-06-09 | Discharge: 2019-06-09 | Disposition: A | Discharge status: Home / Self Care | Payer: Medicare Other | Source: Ambulatory Visit | Attending: Internal Medicine | Admitting: Internal Medicine

## 2019-06-09 DIAGNOSIS — Z1231 Encounter for screening mammogram for malignant neoplasm of breast: Secondary | ICD-10-CM | POA: Insufficient documentation

## 2019-06-27 ENCOUNTER — Other Ambulatory Visit (INDEPENDENT_AMBULATORY_CARE_PROVIDER_SITE_OTHER): Payer: Medicare Other

## 2019-06-27 ENCOUNTER — Other Ambulatory Visit: Payer: Self-pay

## 2019-06-27 DIAGNOSIS — E0500 Thyrotoxicosis with diffuse goiter without thyrotoxic crisis or storm: Secondary | ICD-10-CM | POA: Diagnosis not present

## 2019-06-27 DIAGNOSIS — E039 Hypothyroidism, unspecified: Secondary | ICD-10-CM

## 2019-06-27 DIAGNOSIS — E0590 Thyrotoxicosis, unspecified without thyrotoxic crisis or storm: Secondary | ICD-10-CM

## 2019-06-27 LAB — T4, FREE: Free T4: 0.72 ng/dL (ref 0.60–1.60)

## 2019-06-27 LAB — TSH: TSH: 3.2 u[IU]/mL (ref 0.35–4.50)

## 2019-06-28 ENCOUNTER — Telehealth: Payer: Self-pay

## 2019-06-28 NOTE — Telephone Encounter (Signed)
-----   Message from Philemon Kingdom, MD sent at 06/27/2019 12:54 PM EST ----- [name], can you please call pt: Bethany Cunningham tests are now normal.  Please continue the same dose of levothyroxine and we will recheck her tests when she comes back in March.

## 2019-06-28 NOTE — Telephone Encounter (Signed)
Patient notified

## 2019-07-27 ENCOUNTER — Telehealth: Payer: Self-pay | Admitting: Obstetrics and Gynecology

## 2019-07-27 NOTE — Telephone Encounter (Signed)
Called patient regarding appointment scheduled in our office and advised to come alone to the visit, however, a support person, over age 71, may accompany her to appointment if assistance is needed for safety or care concerns. Otherwise, support persons should remain outside until the visit is complete.   Prescreen questions asked: 1. Any of the following symptoms of COVID such as chills, fever, cough, shortness of breath, muscle pain, diarrhea, rash, vomiting, abdominal pain, red eye, weakness, bruising, bleeding, joint pain, loss of taste or smell, a severe headache, sore throat, fatigue 2. Any exposure to anyone suspected or confirmed of having COVID-19 3. Awaiting test results for COVID-19  Also,to keep you safe, please use the provided hand sanitizer when you enter the office. We are asking everyone in the office to wear a mask to help prevent the spread of germs. If you have a mask of your own, please wear it to your appointment, if not, we are happy to provide one for you.  Thank you for understanding and your cooperation.    [facility] Staff      

## 2019-07-28 ENCOUNTER — Other Ambulatory Visit: Payer: Self-pay

## 2019-07-28 ENCOUNTER — Ambulatory Visit: Payer: Medicare PPO | Admitting: Obstetrics and Gynecology

## 2019-07-28 ENCOUNTER — Encounter: Payer: Self-pay | Admitting: Obstetrics and Gynecology

## 2019-07-28 VITALS — BP 131/69 | HR 74 | Ht 62.0 in | Wt 239.0 lb

## 2019-07-28 DIAGNOSIS — N898 Other specified noninflammatory disorders of vagina: Secondary | ICD-10-CM | POA: Diagnosis not present

## 2019-07-28 LAB — POCT WET PREP (WET MOUNT): Trichomonas Wet Prep HPF POC: ABSENT

## 2019-07-28 MED ORDER — PREMARIN 0.625 MG/GM VA CREA: 1.0000 | TOPICAL_CREAM | VAGINAL | 2 refills | Status: DC

## 2019-07-28 NOTE — Progress Notes (Signed)
   [facility] [place] [facility] Visit  @DATE @            Patient name: Bethany Cunningham MRN [id number]  Date of birth: 06-Nov-1950  CC & HPI:  Bethany Cunningham is a 69 y.o. female presenting today for .  Vulvar itching primarily on the outer labia majora and some vaginal dryness.  She is sexually active  ROS:  ROS   Pertinent History Reviewed:   Reviewed: Significant for status post hysterectomy Medical         Past Medical History:  Diagnosis Date  . Diverticulosis   . Dyslipidemia   . HTN (hypertension)   . Hypothyroid   . Skin disorder                               Surgical Hx:    Past Surgical History:  Procedure Laterality Date  . ABDOMINAL HYSTERECTOMY    . CHOLECYSTECTOMY    . COLONOSCOPY N/A 09/28/2018   Procedure: COLONOSCOPY;  Surgeon: Danie Binder, MD;  [facility];  Service: Endoscopy;  Laterality: N/A;  2:00pm  . POLYPECTOMY  09/28/2018   Procedure: POLYPECTOMY;  Surgeon: Danie Binder, MD;  [facility];  Service: Endoscopy;;  . TONSILLECTOMY     Medications: Reviewed & Updated - see associated section                       Current Outpatient Medications:  .  Aspirin-Acetaminophen (GOODYS BODY PAIN PO), Take 1 Package by mouth daily as needed (pain/headache)., Disp: , Rfl:  .  CRANBERRY CONCENTRATE PO, Take 1 tablet by mouth once a week., Disp: , Rfl:  .  levothyroxine (SYNTHROID) 25 MCG tablet, Take 0.5 tablets (12.5 mcg total) by mouth daily before breakfast., Disp: 30 tablet, Rfl: 3 .  losartan (COZAAR) 50 MG tablet, Take 50 mg by mouth daily., Disp: , Rfl:  .  atorvastatin (LIPITOR) 20 MG tablet, Take 20 mg by mouth daily., Disp: , Rfl:  .  CHLORASEPTIC 1.4 % LIQD, SMARTSIG:1 Spray(s) By Mouth Every 2 Hours PRN, Disp: , Rfl:  .  fluticasone (FLONASE) 50 MCG/ACT nasal spray, , Disp: , Rfl:    Social History: Reviewed -  reports that she quit smoking about 18 years ago. She has never used smokeless tobacco.  Objective Findings:   Vitals: Blood pressure 131/69, pulse 74, height 5\' 2"  (1.575 m), weight 239 lb (108.4 kg).  PHYSICAL EXAMINATION General appearance - alert, well appearing, and in no distress and overweight Mental status - alert, oriented to person, place, and time, normal mood, behavior, speech, dress, motor activity, and thought processes Chest - clear to auscultation, no wheezes, rales or rhonchi, symmetric air entry Heart - normal rate and regular rhythm Abdomen - soft, nontender, nondistended, no masses or organomegaly Exam limited by body habitus Breasts -  Skin -   PELVIC External genitalia -no lesions, some minimal excoriation of the labia majora Vulva -  Vagina -atrophic inner aspects to the labia minora Cervix - surgically absent} Uterus -  surgically absent} Adnexa -unable to feel abnormalities [employer] -  Rectal -     Assessment & Plan:   A:  1. Atrophic vaginitis 2. Vulvar dry skin possible vulvar dystrophy  P:  1. Intravaginal Premarin vaginal cream 2-3 times per week 2. Topical 1% hydrocortisone to labia majora 2-3 times per week

## 2019-07-28 NOTE — Patient Instructions (Signed)
Atrophic Vaginitis Atrophic vaginitis is a condition in which the tissues that line the vagina become dry and thin. This condition occurs in women who have stopped having their period. It is caused by a drop in a female hormone (estrogen). This hormone helps:  To keep the vagina moist.  To make a clear fluid. This clear fluid helps: ? To make the vagina ready for sex. ? To protect the vagina from infection. If the lining of the vagina is dry and thin, it may cause irritation, burning, or itchiness. It may also:  Make sex painful.  Make an exam of your vagina painful.  Cause bleeding.  Make you lose interest in sex.  Cause a burning feeling when you pee (urinate).  Cause a brown or yellow fluid to come from your vagina. Some women do not have symptoms. Follow these instructions at home: Medicines  Take over-the-counter and prescription medicines only as told by your doctor.  Do not use herbs or other medicines unless your doctor says it is okay.  Use medicines for for dryness. These include: ? Oils to make the vagina soft. ? Creams. ? Moisturizers. General instructions  Do not douche.  Do not use products that can make your vagina dry. These include: ? Scented sprays. ? Scented tampons. ? Scented soaps.  Sex can help increase blood flow and soften the tissue in the vagina. If it hurts to have sex: ? Tell your partner. ? Use products to make sex more comfortable. Use these only as told by your doctor. Contact a doctor if you:  Have discharge from the vagina that is different than usual.  Have a bad smell coming from your vagina.  Have new symptoms.  Do not get better.  Get worse. Summary  Atrophic vaginitis is a condition in which the lining of the vagina becomes dry and thin.  This condition affects women who have stopped having their periods.  Treatment may include using products that help make the vagina soft.  Call a doctor if do not get better with  treatment. This information is not intended to replace advice given to you by your health care provider. Make sure you discuss any questions you have with your health care provider. Document Revised: 07/05/2017 Document Reviewed: 07/05/2017 Elsevier Patient Education  2020 Elsevier Inc.  

## 2019-08-07 ENCOUNTER — Encounter: Payer: Self-pay | Admitting: Gastroenterology

## 2019-09-01 ENCOUNTER — Ambulatory Visit: Payer: Medicare PPO | Attending: Internal Medicine

## 2019-09-01 DIAGNOSIS — Z23 Encounter for immunization: Secondary | ICD-10-CM | POA: Insufficient documentation

## 2019-09-01 NOTE — Progress Notes (Signed)
   Covid-19 Vaccination Clinic  Name:  Bethany Cunningham    MRN: [id number] DOB: [birth year]  09/01/2019  Bethany Cunningham was observed post Covid-19 immunization for 15 minutes without incidence. She was provided with Vaccine Information Sheet and instruction to access the V-Safe system.   Bethany Cunningham was instructed to call 911 with any severe reactions post vaccine: Marland Kitchen Difficulty breathing  . Swelling of your face and throat  . A fast heartbeat  . A bad rash all over your body  . Dizziness and weakness    Immunizations Administered    Name Date Dose VIS Date Route   Pfizer COVID-19 Vaccine 09/01/2019  8:12 AM 0.3 mL 06/16/2019 Intramuscular   Manufacturer: [place]   Lot: [id number]   [place]: [id number]

## 2019-09-11 ENCOUNTER — Other Ambulatory Visit: Payer: Self-pay

## 2019-09-13 ENCOUNTER — Other Ambulatory Visit: Payer: Self-pay

## 2019-09-13 ENCOUNTER — Encounter: Payer: Self-pay | Admitting: Internal Medicine

## 2019-09-13 ENCOUNTER — Ambulatory Visit: Payer: Medicare PPO | Admitting: Internal Medicine

## 2019-09-13 VITALS — BP 130/70 | HR 80 | Ht 63.0 in | Wt 235.0 lb

## 2019-09-13 DIAGNOSIS — E0500 Thyrotoxicosis with diffuse goiter without thyrotoxic crisis or storm: Secondary | ICD-10-CM | POA: Diagnosis not present

## 2019-09-13 DIAGNOSIS — E039 Hypothyroidism, unspecified: Secondary | ICD-10-CM | POA: Diagnosis not present

## 2019-09-13 LAB — TSH: TSH: 4.54 u[IU]/mL — ABNORMAL HIGH (ref 0.35–4.50)

## 2019-09-13 LAB — T4, FREE: Free T4: 0.61 ng/dL (ref 0.60–1.60)

## 2019-09-13 NOTE — Progress Notes (Signed)
Patient ID: Bethany Cunningham, female   DOB: [birth year], 69 y.o.   MRN: [id number]   This visit occurred during the SARS-CoV-2 public health emergency.  Safety protocols were in place, including screening questions prior to the visit, additional usage of staff PPE, and extensive cleaning of exam room while observing appropriate contact time as indicated for disinfecting solutions.   HPI  Bethany Cunningham is a 69 y.o.-year-old female, returning for f/u for Graves ds (previous h/o hypothyroidism). Last visit 6 months ago.  Reviewed and addended history: Pt. has been dx with hypothyroidism in approximately 2014 >> on Levothyroxine 112 mcg >> TSH decreased >> dose of LT4 repeatedly decreased >> stopped 05-12/2015. Despite this, TSH remained suppressed.  03/06/2016: TSH 0.07, free T4 1.1 (0.8-1.8) 01/30/2016: TSH 0.11, free T4 0.9  Patient had a thyroid ultrasound on 03/02/2016 that showed a heterogeneous thyroid, without nodules.  Her Graves' antibodies were elevated.  We initially started methimazole 5 mg daily and decrease the dose to 2.5 mg daily in 06/2016.  She was able to come off methimazole in 08/2016.    However, her TSH increased again and free T4 decreased to lower than normal.  We started low-dose levothyroxine, 25 mcg daily, but subsequent TFTs have been thyrotoxic so we ended up stopping levothyroxine 01/24/2018.  In 03/2019, her TSH was elevated and this was confirmed a month later.  Therefore, we ended up starting levothyroxine 12.5 mg daily.  She takes the levothyroxine: - in am (4 am) - fasting - at least 3.5-4 h from b'fast - no Ca, Fe, PPIs - + MVI later in the day - not on Biotin  Reviewed her TFTs: Lab Results  Component Value Date   TSH 3.20 06/27/2019   TSH 6.16 (H) 04/26/2019   TSH 5.38 (H) 03/15/2019   TSH 0.37 06/24/2018   TSH 0.50 03/11/2018   TSH 1.06 01/24/2018   TSH 0.25 (L) 12/15/2017   TSH 0.32 (L) 10/21/2017   TSH 0.55 03/24/2017   TSH 3.69  01/15/2017   FREET4 0.72 06/27/2019   FREET4 0.58 (L) 04/26/2019   FREET4 0.66 03/15/2019   FREET4 0.56 (L) 06/24/2018   FREET4 0.53 (L) 03/11/2018   FREET4 0.64 01/24/2018   FREET4 0.73 12/15/2017   FREET4 0.63 10/21/2017   FREET4 0.76 03/24/2017   FREET4 0.49 (L) 01/15/2017   T3FREE 3.4 04/26/2019   T3FREE 3.4 03/15/2019   T3FREE 4.1 06/24/2018   T3FREE 3.7 03/11/2018   T3FREE 3.9 01/24/2018   T3FREE 3.7 03/24/2017   T3FREE 3.9 01/15/2017   T3FREE 3.5 12/09/2016   T3FREE 4.5 (H) 10/07/2016   T3FREE 3.9 08/31/2016  11/2018: TSH normal reportedly  Lab Results  Component Value Date   TSI 418 (H) 03/15/2019   TSI 446 (H) 03/11/2018   TSI 507 (H) 05/01/2016   Pt denies: - feeling nodules in neck - hoarseness - dysphagia - choking - SOB with lying down  She has + FH of thyroid disorders in: mother and sister (both with Graves ds. - s/p RAI tx). No FH of thyroid cancer. No h/o radiation tx to head or neck.  No herbal supplements. No Biotin use. No recent steroids use.   Pt. also has a history of HTN, HL, TAH, cholecystectomy.   Constitutional: no weight gain/+ weight loss, no fatigue, + chronic subjective hyperthermia, no subjective hypothermia Eyes: + blurry vision, no xerophthalmia ENT: no sore throat, + see HPI Cardiovascular: no CP/no SOB/no palpitations/no leg swelling Respiratory: no cough/no SOB/no wheezing  Gastrointestinal: no N/no V/no D/no C/no acid reflux Musculoskeletal: no muscle aches/no joint aches Skin: no rashes, no hair loss Neurological: no tremors/no numbness/no tingling/no dizziness  I reviewed pt's medications, allergies, PMH, social hx, family hx, and changes were documented in the history of present illness. Otherwise, unchanged from my initial visit note.  Past Medical History:  Diagnosis Date  . Diverticulosis   . Dyslipidemia   . HTN (hypertension)   . Hypothyroid   . Skin disorder    Past Surgical History:  Procedure Laterality  Date  . ABDOMINAL HYSTERECTOMY    . CHOLECYSTECTOMY    . COLONOSCOPY N/A 09/28/2018   Procedure: COLONOSCOPY;  Surgeon: Danie Binder, MD;  [facility];  Service: Endoscopy;  Laterality: N/A;  2:00pm  . POLYPECTOMY  09/28/2018   Procedure: POLYPECTOMY;  Surgeon: Danie Binder, MD;  [facility];  Service: Endoscopy;;  . TONSILLECTOMY     Social History   Social History  . Marital status: Single    Spouse name: N/A  . Number of children: 0   Occupational History  . retired Pharmacist, hospital Retired   Social History Main Topics  . Smoking status: former Smoker - quit 2003  . Smokeless tobacco: No       . Alcohol use No  . Drug use: No   Current Outpatient Medications on File Prior to Visit  Medication Sig Dispense Refill  . Aspirin-Acetaminophen (GOODYS BODY PAIN PO) Take 1 Package by mouth daily as needed (pain/headache).    Marland Kitchen atorvastatin (LIPITOR) 20 MG tablet Take 20 mg by mouth daily.    . CHLORASEPTIC 1.4 % LIQD SMARTSIG:1 Spray(s) By Mouth Every 2 Hours PRN    . conjugated estrogens (PREMARIN) vaginal cream Place 1 Applicatorful vaginally 3 (three) times a week. For vaginal thinning and irritation 30 g 2  . CRANBERRY CONCENTRATE PO Take 1 tablet by mouth once a week.    . fluticasone (FLONASE) 50 MCG/ACT nasal spray     . levothyroxine (SYNTHROID) 25 MCG tablet Take 0.5 tablets (12.5 mcg total) by mouth daily before breakfast. 30 tablet 3  . losartan (COZAAR) 50 MG tablet Take 50 mg by mouth daily.     No current facility-administered medications on file prior to visit.   No Known Allergies Family History  Problem Relation Age of Onset  . AAA (abdominal aortic aneurysm) Mother   . Colon polyps Mother   . Lung cancer Father   . Thyroid disease Sister   . Colon cancer Neg Hx   . Inflammatory bowel disease Neg Hx     PE: BP 130/70   Pulse 80   Ht 5\' 3"  (1.6 m) Comment: measured today without shoes  Wt 235 lb (106.6 kg)   SpO2 97%   BMI 41.63  kg/m  Wt Readings from Last 3 Encounters:  09/13/19 235 lb (106.6 kg)  07/28/19 239 lb (108.4 kg)  03/15/19 233 lb (105.7 kg)   Constitutional: overweight, in NAD Eyes: PERRLA, EOMI, no exophthalmos ENT: moist mucous membranes, no thyromegaly, no cervical lymphadenopathy Cardiovascular: RRR, No RG, +1/6 SEM Respiratory: CTA B Gastrointestinal: abdomen soft, NT, ND, BS+ Musculoskeletal: no deformities, strength intact in all 4 Skin: moist, warm, no rashes Neurological: no tremor with outstretched hands, DTR normal in all 4  ASSESSMENT: 1. Graves ds.  2.  Acquired hypothyroidism  PLAN:  1.  Graves' disease -Patient with an unusual history of hypothyroidism, followed by thyrotoxicity, confirmed as Graves' disease based on the  elevated TSI antibodies, absence of nodules on the thyroid ultrasound and protracted course.  She was on methimazole and did well on this, finally being able to stop completely in 08/2016.  Her TFTs trended towards hypothyroidism again, so we started levothyroxine low-dose 1005 mcg daily).  However, her TSH started to decrease soon afterwards and we ended up stopping levothyroxine 01/2018.At last visit, her TSH was slightly high and we repeated this a month later with persistently elevated TSH.  At that point, we started levothyroxine 12.5 micrograms daily.  On this dose, latest TFTs were normal in 06/2019. -Her TSI antibodies remain elevated (although they are decreasing) so she continues to have autoimmunity against her thyroid.   -No signs of Graves' ophthalmopathy: No double vision, pain with eye movement, chemosis, but we did discuss at last visit about scheduling an appointment with an ophthalmologist as she was having blurry vision. She did not do this yet >> given reference again.  2. Acquired hypothyroidism - latest thyroid labs reviewed with pt >> normal: Lab Results  Component Value Date   TSH 3.20 06/27/2019   - she continues on LT4 12.5 mcg daily - pt  feels good on this dose. - we discussed about taking the thyroid hormone every day, with water, >30 minutes before breakfast, separated by >4 hours from acid reflux medications, calcium, iron, multivitamins. Pt. is taking it correctly. - will check thyroid tests today: TSH and fT4 - If labs are abnormal, she will need to return for repeat TFTs in 1.5 months - I will see her back in 6 months  Needs refills.  Component     Latest Ref Rng & Units 09/13/2019  TSH     0.35 - 4.50 uIU/mL 4.54 (H)  T4,Free(Direct)     0.60 - 1.60 ng/dL 0.61   TSH is now slightly high, will increase the levothyroxine to 25 mcg daily and recheck in 1.5 months.  Philemon Kingdom, MD PhD [facility]

## 2019-09-13 NOTE — Patient Instructions (Signed)
Please continue levothyroxine 12.5 mcg daily.  Take the thyroid hormone every day, with water, at least 30 minutes before breakfast, separated by at least 4 hours from: - acid reflux medications - calcium - iron - multivitamins  Please stop at the lab.  Please call and schedule an eye appt with Dr. Prudencio Burly: [facility]:  Dr. Sherlyn Lick MD ?  Address: [address], [place], [place] [address]  [phone]  Please come back for a follow-up appointment in 6 months.

## 2019-09-14 MED ORDER — LEVOTHYROXINE SODIUM 25 MCG PO TABS: 25.0000 ug | ORAL_TABLET | Freq: Every day | ORAL | 3 refills | Status: DC

## 2019-09-25 ENCOUNTER — Other Ambulatory Visit: Payer: Self-pay | Admitting: Internal Medicine

## 2019-09-26 ENCOUNTER — Ambulatory Visit: Payer: Medicare PPO | Attending: Internal Medicine

## 2019-09-26 DIAGNOSIS — Z23 Encounter for immunization: Secondary | ICD-10-CM

## 2019-09-26 NOTE — Progress Notes (Signed)
   Covid-19 Vaccination Clinic  Name:  Bethany Cunningham    MRN: [id number] DOB: 1951/01/31  09/26/2019  [name] was observed post Covid-19 immunization for 15 minutes without incident. She was provided with Vaccine Information Sheet and instruction to access the V-Safe system.   [name] was instructed to call 911 with any severe reactions post vaccine: Marland Kitchen Difficulty breathing  . Swelling of face and throat  . A fast heartbeat  . A bad rash all over body  . Dizziness and weakness   Immunizations Administered    Name Date Dose VIS Date Route   Pfizer COVID-19 Vaccine 09/26/2019  8:29 AM 0.3 mL 06/16/2019 Intramuscular   Manufacturer: [place]   Lot: [id number]   [place]: [id number]

## 2019-10-27 ENCOUNTER — Other Ambulatory Visit: Payer: Self-pay

## 2019-10-27 ENCOUNTER — Other Ambulatory Visit (INDEPENDENT_AMBULATORY_CARE_PROVIDER_SITE_OTHER): Payer: Medicare PPO

## 2019-10-27 DIAGNOSIS — E039 Hypothyroidism, unspecified: Secondary | ICD-10-CM

## 2019-10-27 LAB — TSH: TSH: 2.8 u[IU]/mL (ref 0.35–4.50)

## 2019-10-27 LAB — T4, FREE: Free T4: 0.61 ng/dL (ref 0.60–1.60)

## 2019-11-14 DIAGNOSIS — E0590 Thyrotoxicosis, unspecified without thyrotoxic crisis or storm: Secondary | ICD-10-CM | POA: Diagnosis not present

## 2019-11-14 DIAGNOSIS — E782 Mixed hyperlipidemia: Secondary | ICD-10-CM | POA: Diagnosis not present

## 2019-11-14 DIAGNOSIS — Z0000 Encounter for general adult medical examination without abnormal findings: Secondary | ICD-10-CM | POA: Diagnosis not present

## 2019-11-14 DIAGNOSIS — E039 Hypothyroidism, unspecified: Secondary | ICD-10-CM | POA: Diagnosis not present

## 2019-11-14 DIAGNOSIS — Z0001 Encounter for general adult medical examination with abnormal findings: Secondary | ICD-10-CM | POA: Diagnosis not present

## 2019-11-14 DIAGNOSIS — R0602 Shortness of breath: Secondary | ICD-10-CM | POA: Diagnosis not present

## 2019-11-14 DIAGNOSIS — I10 Essential (primary) hypertension: Secondary | ICD-10-CM | POA: Diagnosis not present

## 2019-11-14 DIAGNOSIS — E6609 Other obesity due to excess calories: Secondary | ICD-10-CM | POA: Diagnosis not present

## 2019-11-14 DIAGNOSIS — R0600 Dyspnea, unspecified: Secondary | ICD-10-CM | POA: Diagnosis not present

## 2019-11-25 DIAGNOSIS — M25572 Pain in left ankle and joints of left foot: Secondary | ICD-10-CM | POA: Diagnosis not present

## 2019-11-25 DIAGNOSIS — E039 Hypothyroidism, unspecified: Secondary | ICD-10-CM | POA: Diagnosis not present

## 2019-11-25 DIAGNOSIS — E782 Mixed hyperlipidemia: Secondary | ICD-10-CM | POA: Diagnosis not present

## 2019-11-25 DIAGNOSIS — R21 Rash and other nonspecific skin eruption: Secondary | ICD-10-CM | POA: Diagnosis not present

## 2019-11-25 DIAGNOSIS — R718 Other abnormality of red blood cells: Secondary | ICD-10-CM | POA: Diagnosis not present

## 2019-11-25 DIAGNOSIS — E6609 Other obesity due to excess calories: Secondary | ICD-10-CM | POA: Diagnosis not present

## 2019-11-25 DIAGNOSIS — R7303 Prediabetes: Secondary | ICD-10-CM | POA: Diagnosis not present

## 2019-11-25 DIAGNOSIS — I10 Essential (primary) hypertension: Secondary | ICD-10-CM | POA: Diagnosis not present

## 2019-11-25 DIAGNOSIS — J302 Other seasonal allergic rhinitis: Secondary | ICD-10-CM | POA: Diagnosis not present

## 2020-01-12 DIAGNOSIS — S81811A Laceration without foreign body, right lower leg, initial encounter: Secondary | ICD-10-CM | POA: Diagnosis not present

## 2020-01-12 DIAGNOSIS — R600 Localized edema: Secondary | ICD-10-CM | POA: Diagnosis not present

## 2020-01-19 ENCOUNTER — Encounter: Payer: Self-pay | Admitting: *Deleted

## 2020-01-19 ENCOUNTER — Ambulatory Visit: Payer: Medicare PPO | Admitting: Cardiology

## 2020-01-19 ENCOUNTER — Encounter: Payer: Self-pay | Admitting: Cardiology

## 2020-01-19 VITALS — BP 142/82 | HR 64 | Ht 62.5 in | Wt 236.0 lb

## 2020-01-19 DIAGNOSIS — R0609 Other forms of dyspnea: Secondary | ICD-10-CM

## 2020-01-19 DIAGNOSIS — R600 Localized edema: Secondary | ICD-10-CM

## 2020-01-19 DIAGNOSIS — R079 Chest pain, unspecified: Secondary | ICD-10-CM | POA: Diagnosis not present

## 2020-01-19 DIAGNOSIS — R0600 Dyspnea, unspecified: Secondary | ICD-10-CM | POA: Diagnosis not present

## 2020-01-19 DIAGNOSIS — I10 Essential (primary) hypertension: Secondary | ICD-10-CM | POA: Diagnosis not present

## 2020-01-19 NOTE — Patient Instructions (Signed)
Your physician wants you to follow-up in: [place] 2-3 [place] will receive a reminder letter in the mail two months in advance. If you don't receive a letter, please call our office to schedule the follow-up appointment.  Your physician recommends that you continue on your current medications as directed. Please refer to the Current Medication list given to you today.  Thank you for choosing [facility]!!

## 2020-01-19 NOTE — Progress Notes (Signed)
Clinical Summary Ms. Carnell is a 69 y.o.female seen today for follow up of the following medical problems.   1. Chestpressure/DOE 01/2018 echo: LVEF 75-64%, grade I diastolic dysfunction 09/3293 nuclear stress: mild apical defect, may be differences in apical thinning vs very mild ischemia.  - no recurrent symptoms since last visit. - no significant SOB/DOE.   2. HTN - compliant with meds.   3. LE edema - mild bilateral R>L, started on lasix x 3 days.   Past Medical History:  Diagnosis Date  . Diverticulosis   . Dyslipidemia   . HTN (hypertension)   . Hypothyroid   . Skin disorder      No Known Allergies   Current Outpatient Medications  Medication Sig Dispense Refill  . Aspirin-Acetaminophen (GOODYS BODY PAIN PO) Take 1 Package by mouth daily as needed (pain/headache).    Marland Kitchen atorvastatin (LIPITOR) 20 MG tablet Take 20 mg by mouth daily.    . CHLORASEPTIC 1.4 % LIQD SMARTSIG:1 Spray(s) By Mouth Every 2 Hours PRN    . conjugated estrogens (PREMARIN) vaginal cream Place 1 Applicatorful vaginally 3 (three) times a week. For vaginal thinning and irritation 30 g 2  . CRANBERRY CONCENTRATE PO Take 1 tablet by mouth once a week.    . fluticasone (FLONASE) 50 MCG/ACT nasal spray     . furosemide (LASIX) 20 MG tablet Take 20 mg by mouth daily.    Marland Kitchen levothyroxine (SYNTHROID) 25 MCG tablet TAKE 1/2 TABLETS BY MOUTH DAILY BEFORE BREAKFAST. 30 tablet 3  . losartan (COZAAR) 50 MG tablet Take 50 mg by mouth daily.     No current facility-administered medications for this visit.     Past Surgical History:  Procedure Laterality Date  . ABDOMINAL HYSTERECTOMY    . CHOLECYSTECTOMY    . COLONOSCOPY N/A 09/28/2018   Procedure: COLONOSCOPY;  Surgeon: Danie Binder, MD;  [facility];  Service: Endoscopy;  Laterality: N/A;  2:00pm  . POLYPECTOMY  09/28/2018   Procedure: POLYPECTOMY;  Surgeon: Danie Binder, MD;  [facility];  Service: Endoscopy;;   . TONSILLECTOMY       No Known Allergies    Family History  Problem Relation Age of Onset  . AAA (abdominal aortic aneurysm) Mother   . Colon polyps Mother   . Lung cancer Father   . Thyroid disease Sister   . Colon cancer Neg Hx   . Inflammatory bowel disease Neg Hx      Social History Ms. Zalenski reports that she quit smoking about 18 years ago. She has never used smokeless tobacco. Ms. Pavelko reports current alcohol use.   Review of Systems CONSTITUTIONAL: No weight loss, fever, chills, weakness or fatigue.  HEENT: Eyes: No visual loss, blurred vision, double vision or yellow sclerae.No hearing loss, sneezing, congestion, runny nose or sore throat.  SKIN: No rash or itching.  CARDIOVASCULAR: per hpi RESPIRATORY: No shortness of breath, cough or sputum.  GASTROINTESTINAL: No anorexia, nausea, vomiting or diarrhea. No abdominal pain or blood.  GENITOURINARY: No burning on urination, no polyuria NEUROLOGICAL: No headache, dizziness, syncope, paralysis, ataxia, numbness or tingling in the extremities. No change in bowel or bladder control.  MUSCULOSKELETAL: No muscle, back pain, joint pain or stiffness.  LYMPHATICS: No enlarged nodes. No history of splenectomy.  PSYCHIATRIC: No history of depression or anxiety.  ENDOCRINOLOGIC: No reports of sweating, cold or heat intolerance. No polyuria or polydipsia.  Marland Kitchen   Physical Examination There were no vitals  filed for this visit. Filed Weights   01/19/20 0814  Weight: 236 lb (107 kg)    Gen: resting comfortably, no acute distress HEENT: no scleral icterus, pupils equal round and reactive, no palptable cervical adenopathy,  CV: RRR, 2/6 systolic murmur rusb, no jvd Resp: Clear to auscultation bilaterally GI: abdomen is soft, non-tender, non-distended, normal bowel sounds, no hepatosplenomegaly MSK: extremities are warm, 1+ bilateral LE edema Skin: warm, no rash Neuro:  no focal deficits Psych: appropriate  affect   Diagnostic Studies 01/2018 echo Study Conclusions  - Procedure narrative: Transthoracic echocardiography. Image quality was poor. The study was technically difficult, as a result of poor acoustic windows and body habitus. - Left ventricle: The cavity size was normal. Wall thickness was increased in a pattern of mild LVH. Systolic function was normal. The estimated ejection fraction was in the range of 60% to 65%. Images were inadequate for LV wall motion assessment. Doppler parameters are consistent with abnormal left ventricular relaxation (grade 1 diastolic dysfunction). Doppler parameters are consistent with high ventricular filling pressure. - Aortic valve: There was mild regurgitation. - Mitral valve: Mildly calcified annulus. Normal thickness leaflets  01/2018 nuclear stress  There was no ST segment deviation noted during stress.  This is a low risk study.  The left ventricular ejection fraction is hyperdynamic (>65%).  Small very mild reversible apical defect. May represent differences in apical thinning vs very mild apical ischemia, either finding would support low risk.    Assessment and Plan  1. Chest pressure/DOE - benign workup in 2019, no significant symptosm since - continue to monitor - EKG shows SR, chronic ST/T changes  2. HTN - above goal bp today. Just started on diuretic by pcp, perhaps with some diuresis bp may trend down. If not would increase her losartan, she will come back in 2-3 weeks for nursing bp check  3. LE Edema - echo 2019 with normal LVEF, mild diastolic dysfunction. Perhaps this is the cause. Other possibilities include venous insufficiency or obesity - just started on diuretic by pcp, follow response - would not repeat echo at this time unless significant progression      Arnoldo Lenis, M.D.

## 2020-02-09 ENCOUNTER — Ambulatory Visit (INDEPENDENT_AMBULATORY_CARE_PROVIDER_SITE_OTHER): Payer: Medicare PPO | Admitting: *Deleted

## 2020-02-09 ENCOUNTER — Encounter: Payer: Self-pay | Admitting: *Deleted

## 2020-02-09 VITALS — BP 140/70 | HR 76 | Ht 62.5 in | Wt 241.8 lb

## 2020-02-09 DIAGNOSIS — I10 Essential (primary) hypertension: Secondary | ICD-10-CM | POA: Diagnosis not present

## 2020-02-09 NOTE — Patient Instructions (Signed)
Continue same plan Your provider will be contacted with these readings

## 2020-02-09 NOTE — Progress Notes (Signed)
Presents to office for nurse BP check per last office visit with Dr. Branch-01/19/2020  2. HTN - above goal bp today. Just started on diuretic by pcp, perhaps with some diuresis bp may trend down. If not would increase her losartan, she will come back in 2-3 weeks for nursing bp check  Medications reviewed. Reports taking all doses of medications as prescribed without missing doses and without side effects. Denies chest pain or dizziness. Reports having SOB all the time that is unchanged.

## 2020-02-12 ENCOUNTER — Telehealth: Payer: Self-pay | Admitting: *Deleted

## 2020-02-12 DIAGNOSIS — I10 Essential (primary) hypertension: Secondary | ICD-10-CM

## 2020-02-12 MED ORDER — LOSARTAN POTASSIUM 100 MG PO TABS: 100.0000 mg | ORAL_TABLET | Freq: Every day | ORAL | 2 refills | Status: DC

## 2020-02-12 NOTE — Telephone Encounter (Signed)
Patient informed and verbalized understanding of plan. Lab order faxed to [facility].

## 2020-02-12 NOTE — Progress Notes (Signed)
BP is too high, incrase losartan to 100mg  daily. Check BMET in 2 weeks   Zandra Abts MD

## 2020-02-12 NOTE — Telephone Encounter (Signed)
-----   Message from Arnoldo Lenis, MD sent at 02/12/2020  2:22 PM EDT -----   ----- Message ----- From: Merlene Laughter, RN Sent: 02/09/2020   8:29 AM EDT To: Arnoldo Lenis, MD

## 2020-02-12 NOTE — Telephone Encounter (Signed)
BP is too high, incrase losartan to 100mg  daily. Check BMET in 2 weeks   Zandra Abts MD

## 2020-02-27 ENCOUNTER — Other Ambulatory Visit (HOSPITAL_COMMUNITY)
Admission: RE | Admit: 2020-02-27 | Discharge: 2020-02-27 | Disposition: A | Discharge status: Home / Self Care | Payer: Medicare PPO | Source: Ambulatory Visit | Attending: Cardiology | Admitting: Cardiology

## 2020-02-27 DIAGNOSIS — I10 Essential (primary) hypertension: Secondary | ICD-10-CM | POA: Diagnosis not present

## 2020-02-27 LAB — BASIC METABOLIC PANEL
Anion gap: 10 (ref 5–15)
BUN: 15 mg/dL (ref 8–23)
CO2: 22 mmol/L (ref 22–32)
Calcium: 8.9 mg/dL (ref 8.9–10.3)
Chloride: 105 mmol/L (ref 98–111)
Creatinine, Ser: 0.75 mg/dL (ref 0.44–1.00)
GFR calc Af Amer: >60 mL/min (ref >60)
GFR calc non Af Amer: >60 mL/min (ref >60)
Glucose, Bld: 127 mg/dL — ABNORMAL HIGH (ref 70–99)
Potassium: 4.1 mmol/L (ref 3.5–5.1)
Sodium: 137 mmol/L (ref 135–145)

## 2020-03-18 ENCOUNTER — Ambulatory Visit (INDEPENDENT_AMBULATORY_CARE_PROVIDER_SITE_OTHER): Payer: Medicare PPO

## 2020-03-18 ENCOUNTER — Encounter: Payer: Self-pay | Admitting: Internal Medicine

## 2020-03-18 ENCOUNTER — Other Ambulatory Visit: Payer: Self-pay

## 2020-03-18 ENCOUNTER — Ambulatory Visit: Payer: Medicare PPO | Admitting: Internal Medicine

## 2020-03-18 VITALS — BP 150/90 | HR 86 | Ht 62.5 in | Wt 236.0 lb

## 2020-03-18 DIAGNOSIS — E039 Hypothyroidism, unspecified: Secondary | ICD-10-CM | POA: Diagnosis not present

## 2020-03-18 DIAGNOSIS — E0500 Thyrotoxicosis with diffuse goiter without thyrotoxic crisis or storm: Secondary | ICD-10-CM

## 2020-03-18 LAB — TSH: TSH: 6.12 u[IU]/mL — ABNORMAL HIGH (ref 0.35–4.50)

## 2020-03-18 LAB — T4, FREE: Free T4: 0.72 ng/dL (ref 0.60–1.60)

## 2020-03-18 NOTE — Progress Notes (Signed)
Patient ID: Bethany Cunningham, female   DOB: Sep 29, 1950, 69 y.o.   MRN: [id number]   This visit occurred during the SARS-CoV-2 public health emergency.  Safety protocols were in place, including screening questions prior to the visit, additional usage of staff PPE, and extensive cleaning of exam room while observing appropriate contact time as indicated for disinfecting solutions.   HPI  Bethany Cunningham is a 69 y.o.-year-old female, returning for f/u for history of Graves ds and acquired hypothyroidism. Last visit 6 months ago.  Reviewed and addended history: Pt. has been dx with hypothyroidism in approximately 2014 >> on Levothyroxine 112 mcg >> TSH decreased >> dose of LT4 repeatedly decreased >> stopped 05-12/2015. Despite this, TSH remained suppressed.  03/06/2016: TSH 0.07, free T4 1.1 (0.8-1.8) 01/30/2016: TSH 0.11, free T4 0.9  Patient had a thyroid ultrasound on 03/02/2016 that showed a heterogeneous thyroid, without nodules.  Her Graves' antibodies were elevated.  We initially started methimazole 5 mg daily and decrease the dose to 2.5 mg daily in 06/2016.  She was able to come off methimazole in 08/2016.    However, her TSH increased again and free T4 decreased to lower than normal.  We started low-dose levothyroxine, 25 mcg daily, but subsequent TFTs have been thyrotoxic so we ended up stopping levothyroxine 01/24/2018.  In 03/2019, her TSH was elevated and this was confirmed a month later.  Therefore, we ended up starting levothyroxine 12.5 mg daily.  In 09/2019: We increase levothyroxine to 25 mcg daily.  Pt is currently on levothyroxine 25 mcg daily, taken: - in am (at 4 AM) - fasting - at least 3.5 hours from b'fast - no Ca, Fe, PPIs, + multivitamins later in the day when  - not on Biotin On Fish oil, 3600 mg daily.  Reviewed her TFTs: Lab Results  Component Value Date   TSH 2.80 10/27/2019   TSH 4.54 (H) 09/13/2019   TSH 3.20 06/27/2019   TSH 6.16 (H)  04/26/2019   TSH 5.38 (H) 03/15/2019   TSH 0.37 06/24/2018   TSH 0.50 03/11/2018   TSH 1.06 01/24/2018   TSH 0.25 (L) 12/15/2017   TSH 0.32 (L) 10/21/2017   FREET4 0.61 10/27/2019   FREET4 0.61 09/13/2019   FREET4 0.72 06/27/2019   FREET4 0.58 (L) 04/26/2019   FREET4 0.66 03/15/2019   FREET4 0.56 (L) 06/24/2018   FREET4 0.53 (L) 03/11/2018   FREET4 0.64 01/24/2018   FREET4 0.73 12/15/2017   FREET4 0.63 10/21/2017   T3FREE 3.4 04/26/2019   T3FREE 3.4 03/15/2019   T3FREE 4.1 06/24/2018   T3FREE 3.7 03/11/2018   T3FREE 3.9 01/24/2018   T3FREE 3.7 03/24/2017   T3FREE 3.9 01/15/2017   T3FREE 3.5 12/09/2016   T3FREE 4.5 (H) 10/07/2016   T3FREE 3.9 08/31/2016  11/2018: TSH normal reportedly  Her TSI's were elevated: Lab Results  Component Value Date   TSI 418 (H) 03/15/2019   TSI 446 (H) 03/11/2018   TSI 507 (H) 05/01/2016   Pt denies: - feeling nodules in neck - hoarseness - dysphagia - choking - SOB with lying down  She has + FH of thyroid disorders in: mother and sister (both with Graves ds. - s/p RAI tx). No FH of thyroid cancer. No h/o radiation tx to head or neck.  No herbal supplements. No Biotin use. No recent steroids use.   Pt. also has a history of HTN, HL, TAH, cholecystectomy.   Constitutional: no weight gain/no weight loss, no fatigue, + chronic subjective  hyperthermia, no subjective hypothermia Eyes: no blurry vision, no xerophthalmia ENT: no sore throat, + see HPI Cardiovascular: no CP/no SOB/no palpitations/no leg swelling Respiratory: no cough/no SOB/no wheezing Gastrointestinal: no N/no V/no D/no C/no acid reflux Musculoskeletal: no muscle aches/no joint aches Skin: no rashes, no hair loss Neurological: no tremors/no numbness/no tingling/no dizziness  I reviewed pt's medications, allergies, PMH, social hx, family hx, and changes were documented in the history of present illness. Otherwise, unchanged from my initial visit note.  Past Medical  History:  Diagnosis Date  . Diverticulosis   . Dyslipidemia   . HTN (hypertension)   . Hypothyroid   . Skin disorder    Past Surgical History:  Procedure Laterality Date  . ABDOMINAL HYSTERECTOMY    . CHOLECYSTECTOMY    . COLONOSCOPY N/A 09/28/2018   Procedure: COLONOSCOPY;  Surgeon: Danie Binder, MD;  [facility];  Service: Endoscopy;  Laterality: N/A;  2:00pm  . POLYPECTOMY  09/28/2018   Procedure: POLYPECTOMY;  Surgeon: Danie Binder, MD;  Location: [facility];  Service: Endoscopy;;  . TONSILLECTOMY     Social History   Social History  . Marital status: Single    Spouse name: N/A  . Number of children: 0   Occupational History  . retired Pharmacist, hospital Retired   Social History Main Topics  . Smoking status: former Smoker - quit 2003  . Smokeless tobacco: No       . Alcohol use No  . Drug use: No   Current Outpatient Medications on File Prior to Visit  Medication Sig Dispense Refill  . Aspirin-Acetaminophen (GOODYS BODY PAIN PO) Take 1 Package by mouth daily as needed (pain/headache).    Marland Kitchen atorvastatin (LIPITOR) 20 MG tablet Take 20 mg by mouth daily.    Marland Kitchen CRANBERRY CONCENTRATE PO Take 1 tablet by mouth once a week.    . fluticasone (FLONASE) 50 MCG/ACT nasal spray Place 2 sprays into both nostrils daily as needed.     . furosemide (LASIX) 20 MG tablet Take 20 mg by mouth daily.    Marland Kitchen levothyroxine (SYNTHROID) 25 MCG tablet Take 25 mcg by mouth daily before breakfast.    . losartan (COZAAR) 100 MG tablet Take 1 tablet (100 mg total) by mouth daily. 90 tablet 2  . tiZANidine (ZANAFLEX) 2 MG tablet Take 2 mg by mouth every 6 (six) hours as needed for muscle spasms.     No current facility-administered medications on file prior to visit.   No Known Allergies Family History  Problem Relation Age of Onset  . AAA (abdominal aortic aneurysm) Mother   . Colon polyps Mother   . Lung cancer Father   . Thyroid disease Sister   . Colon cancer Neg Hx   .  Inflammatory bowel disease Neg Hx     PE: BP (!) 150/90   Pulse 86   Ht 5' 2.5" (1.588 m)   Wt 236 lb (107 kg)   SpO2 95%   BMI 42.48 kg/m  Wt Readings from Last 3 Encounters:  03/18/20 236 lb (107 kg)  02/09/20 241 lb 12.8 oz (109.7 kg)  01/19/20 236 lb (107 kg)   Constitutional: overweight, in NAD Eyes: PERRLA, EOMI, no exophthalmos ENT: moist mucous membranes, no thyromegaly, no cervical lymphadenopathy Cardiovascular: RRR, No MRG Respiratory: CTA B Gastrointestinal: abdomen soft, NT, ND, BS+ Musculoskeletal: no deformities, strength intact in all 4 Skin: moist, warm, no rashes Neurological: no tremor with outstretched hands, DTR normal in all 4  ASSESSMENT: 1. Graves ds.  2.  Acquired hypothyroidism  PLAN:  1.  Graves' disease -Patient with an unusual history of hypothyroidism, followed by thyroid toxicity (confirmed as Graves' disease based on elevated TSI antibodies, absence of nodules on the thyroid ultrasound and protracted course).  She was on methimazole and did well on this, finally being able to stop completely 08/2016.  Her TFTs trended towards hypothyroidism again so we started levothyroxine low-dose, 12.5 mcg daily.  However, her TSH started to decrease again and we stopped her levothyroxine 01/2018.  TSH trended up afterwards and we restarted levothyroxine 12.5 mcg daily.  At last visit, TSH was slightly high so we increased the levothyroxine dose to 25 mcg daily in 09/2019.  On this dose, her TFTs were normal in 10/2019 -Her TSH remains elevated, although decreasing so she continues to have autoimmunity against her thyroid -No signs of Graves' ophthalmopathy: No double vision, pain with eye movement, chemosis, but we discussed about seeing her ophthalmologist that she was having blurry vision at previous visits.  She has an appt with Dr. Delman Cheadle in 2 days.  2. Acquired hypothyroidism - latest thyroid labs reviewed with pt >> normal: Lab Results  Component Value  Date   TSH 2.80 10/27/2019   - she continues on LT4 25 mcg daily - pt feels good on this dose. - we discussed about taking the thyroid hormone every day, with water, >30 minutes before breakfast, separated by >4 hours from acid reflux medications, calcium, iron, multivitamins. Pt. is taking it correctly. - will check thyroid tests today: TSH and fT4 - If labs are abnormal, she will need to return for repeat TFTs in 1.5 months  Needs refills.  Clinical Support on 03/18/2020  Component Date Value Ref Range Status  . Free T4 03/18/2020 0.72  0.60 - 1.60 ng/dL Final   Comment: Specimens from patients who are undergoing biotin therapy and /or ingesting biotin supplements may contain high levels of biotin.  The higher biotin concentration in these specimens interferes with this Free T4 assay.  Specimens that contain high levels  of biotin may cause false high results for this Free T4 assay.  Please interpret results in light of the total clinical presentation of the patient.    Marland Kitchen TSH 03/18/2020 6.12* 0.35 - 4.50 uIU/mL Final   TSH is elevated.  We will increase the levothyroxine to 50 mcg daily and recheck the tests in 1.5 mo.  Philemon Kingdom, MD PhD [facility]

## 2020-03-18 NOTE — Patient Instructions (Signed)
Please continue levothyroxine 25 mcg daily.  Take the thyroid hormone every day, with water, at least 30 minutes before breakfast, separated by at least 4 hours from: - acid reflux medications - calcium - iron - multivitamins  Please stop at the lab.  Please come back for a follow-up appointment in 6 months.

## 2020-03-20 DIAGNOSIS — H532 Diplopia: Secondary | ICD-10-CM | POA: Diagnosis not present

## 2020-03-20 DIAGNOSIS — H52203 Unspecified astigmatism, bilateral: Secondary | ICD-10-CM | POA: Diagnosis not present

## 2020-03-25 ENCOUNTER — Telehealth: Payer: Self-pay | Admitting: Internal Medicine

## 2020-03-25 MED ORDER — LEVOTHYROXINE SODIUM 50 MCG PO TABS: 25.0000 ug | ORAL_TABLET | Freq: Every day | ORAL | 5 refills | Status: DC

## 2020-03-25 NOTE — Telephone Encounter (Signed)
Patient called to advise that she got her lab results from 03/18/20, but would like to know how to proceed, if anything needs to change, or if all stays the same  Call back [phone]

## 2020-03-25 NOTE — Telephone Encounter (Signed)
M, please apologize to her, I did not get these results in my in basket.  However, I do see them in the system.  TSH is slightly high so we need to increase the dose of levothyroxine to 50 mcg daily (dose sent to her pharmacy) and repeat the test in 1.5 months (labs in).

## 2020-03-28 ENCOUNTER — Telehealth: Payer: Self-pay

## 2020-03-28 MED ORDER — LEVOTHYROXINE SODIUM 50 MCG PO TABS: 25.0000 ug | ORAL_TABLET | Freq: Every day | ORAL | 5 refills | Status: DC

## 2020-03-28 NOTE — Telephone Encounter (Signed)
New message    Pt c/o medication issue:  1. Name of Medication: levothyroxine (SYNTHROID) 50 MCG tablet  2. How are you currently taking this medication (dosage and times per day)? New medication pick up   3. Are you having a reaction (difficulty breathing--STAT)? No   4. What is your medication issue? Need clarification on direction of medication

## 2020-03-28 NOTE — Telephone Encounter (Signed)
RX sent

## 2020-03-29 ENCOUNTER — Other Ambulatory Visit: Payer: Self-pay

## 2020-03-29 MED ORDER — LEVOTHYROXINE SODIUM 50 MCG PO TABS: 50.0000 ug | ORAL_TABLET | Freq: Every day | ORAL | 3 refills | Status: DC

## 2020-04-05 DIAGNOSIS — H04123 Dry eye syndrome of bilateral lacrimal glands: Secondary | ICD-10-CM | POA: Diagnosis not present

## 2020-04-05 DIAGNOSIS — H10413 Chronic giant papillary conjunctivitis, bilateral: Secondary | ICD-10-CM | POA: Diagnosis not present

## 2020-04-30 ENCOUNTER — Other Ambulatory Visit: Payer: Self-pay

## 2020-04-30 ENCOUNTER — Other Ambulatory Visit (INDEPENDENT_AMBULATORY_CARE_PROVIDER_SITE_OTHER): Payer: Medicare PPO

## 2020-04-30 DIAGNOSIS — E039 Hypothyroidism, unspecified: Secondary | ICD-10-CM

## 2020-04-30 LAB — TSH: TSH: 1.24 u[IU]/mL (ref 0.35–4.50)

## 2020-04-30 LAB — T4, FREE: Free T4: 0.76 ng/dL (ref 0.60–1.60)

## 2020-05-12 ENCOUNTER — Other Ambulatory Visit: Payer: Self-pay | Admitting: Cardiology

## 2020-05-27 DIAGNOSIS — E6609 Other obesity due to excess calories: Secondary | ICD-10-CM | POA: Diagnosis not present

## 2020-05-27 DIAGNOSIS — Z23 Encounter for immunization: Secondary | ICD-10-CM | POA: Diagnosis not present

## 2020-05-27 DIAGNOSIS — R0600 Dyspnea, unspecified: Secondary | ICD-10-CM | POA: Diagnosis not present

## 2020-05-27 DIAGNOSIS — Z0000 Encounter for general adult medical examination without abnormal findings: Secondary | ICD-10-CM | POA: Diagnosis not present

## 2020-05-27 DIAGNOSIS — E0590 Thyrotoxicosis, unspecified without thyrotoxic crisis or storm: Secondary | ICD-10-CM | POA: Diagnosis not present

## 2020-05-27 DIAGNOSIS — R21 Rash and other nonspecific skin eruption: Secondary | ICD-10-CM | POA: Diagnosis not present

## 2020-05-27 DIAGNOSIS — S62609D Fracture of unspecified phalanx of unspecified finger, subsequent encounter for fracture with routine healing: Secondary | ICD-10-CM | POA: Diagnosis not present

## 2020-05-27 DIAGNOSIS — Z0001 Encounter for general adult medical examination with abnormal findings: Secondary | ICD-10-CM | POA: Diagnosis not present

## 2020-05-27 DIAGNOSIS — Z712 Person consulting for explanation of examination or test findings: Secondary | ICD-10-CM | POA: Diagnosis not present

## 2020-06-08 DIAGNOSIS — I10 Essential (primary) hypertension: Secondary | ICD-10-CM | POA: Diagnosis not present

## 2020-06-08 DIAGNOSIS — R21 Rash and other nonspecific skin eruption: Secondary | ICD-10-CM | POA: Diagnosis not present

## 2020-06-08 DIAGNOSIS — Z0001 Encounter for general adult medical examination with abnormal findings: Secondary | ICD-10-CM | POA: Diagnosis not present

## 2020-06-08 DIAGNOSIS — Z23 Encounter for immunization: Secondary | ICD-10-CM | POA: Diagnosis not present

## 2020-06-08 DIAGNOSIS — R718 Other abnormality of red blood cells: Secondary | ICD-10-CM | POA: Diagnosis not present

## 2020-06-08 DIAGNOSIS — E039 Hypothyroidism, unspecified: Secondary | ICD-10-CM | POA: Diagnosis not present

## 2020-06-08 DIAGNOSIS — E782 Mixed hyperlipidemia: Secondary | ICD-10-CM | POA: Diagnosis not present

## 2020-06-08 DIAGNOSIS — J302 Other seasonal allergic rhinitis: Secondary | ICD-10-CM | POA: Diagnosis not present

## 2020-06-08 DIAGNOSIS — R7303 Prediabetes: Secondary | ICD-10-CM | POA: Diagnosis not present

## 2020-08-10 DIAGNOSIS — I10 Essential (primary) hypertension: Secondary | ICD-10-CM | POA: Diagnosis not present

## 2020-08-14 ENCOUNTER — Other Ambulatory Visit: Payer: Self-pay | Admitting: Cardiology

## 2020-08-27 DIAGNOSIS — J029 Acute pharyngitis, unspecified: Secondary | ICD-10-CM | POA: Diagnosis not present

## 2020-08-27 DIAGNOSIS — R509 Fever, unspecified: Secondary | ICD-10-CM | POA: Diagnosis not present

## 2020-09-17 ENCOUNTER — Ambulatory Visit: Payer: Medicare PPO | Admitting: Internal Medicine

## 2020-09-17 ENCOUNTER — Encounter: Payer: Self-pay | Admitting: Internal Medicine

## 2020-09-17 ENCOUNTER — Other Ambulatory Visit: Payer: Self-pay

## 2020-09-17 VITALS — BP 152/80 | HR 92 | Ht 62.0 in | Wt 229.0 lb

## 2020-09-17 DIAGNOSIS — E039 Hypothyroidism, unspecified: Secondary | ICD-10-CM

## 2020-09-17 DIAGNOSIS — E0500 Thyrotoxicosis with diffuse goiter without thyrotoxic crisis or storm: Secondary | ICD-10-CM | POA: Diagnosis not present

## 2020-09-17 LAB — T4, FREE: Free T4: 0.8 ng/dL (ref 0.60–1.60)

## 2020-09-17 LAB — TSH: TSH: 2.51 u[IU]/mL (ref 0.35–4.50)

## 2020-09-17 NOTE — Patient Instructions (Signed)
Please continue levothyroxine 50 mcg daily.  Take the thyroid hormone every day, with water, at least 30 minutes before breakfast, separated by at least 4 hours from: - acid reflux medications - calcium - iron - multivitamins  Please stop at the lab.  Please come back for a follow-up appointment in 6 months.

## 2020-09-17 NOTE — Progress Notes (Signed)
Normal Patient ID: Bethany Cunningham, female   DOB: 05/14/1951, 70 y.o.   MRN: [id number]   This visit occurred during the SARS-CoV-2 public health emergency.  Safety protocols were in place, including screening questions prior to the visit, additional usage of staff PPE, and extensive cleaning of exam room while observing appropriate contact time as indicated for disinfecting solutions.   HPI  Bethany Cunningham is a 70 y.o.-year-old female, returning for f/u for history of Graves ds and acquired hypothyroidism. Last visit 6 months ago.  Interim history: Since last OV, we increased her LT4 dose >> she does not feel different. She did have an upper respiratory infection with a low-grade fever recently, but Covid test was negative. Her eyes are gritty but otherwise she does not have any complaints.  Reviewed and addended history: Pt. has been dx with hypothyroidism in approximately 2014 >> on Levothyroxine 112 mcg >> TSH decreased >> dose of LT4 repeatedly decreased >> stopped 05-12/2015. Despite this, TSH remained suppressed.  03/06/2016: TSH 0.07, free T4 1.1 (0.8-1.8) 01/30/2016: TSH 0.11, free T4 0.9  Patient had a thyroid ultrasound on 03/02/2016 that showed a heterogeneous thyroid, without nodules.  Her Graves' antibodies were elevated.  We initially started methimazole 5 mg daily and decrease the dose to 2.5 mg daily in 06/2016.  She was able to come off methimazole in 08/2016.    However, her TSH increased again and free T4 decreased to lower than normal.  We started low-dose levothyroxine, 25 mcg daily, but subsequent TFTs have been thyrotoxic so we ended up stopping levothyroxine 01/24/2018.  In 03/2019, her TSH was elevated and this was confirmed a month later.  Therefore, we ended up starting levothyroxine 12.5 mg daily.  In 09/2019: We increased levothyroxine to 25 mcg daily.  In 03/2020: We increased levothyroxine to 50 mcg daily  She is currently on levothyroxine 50 mcg  daily: - in am (at 1 AM) - dinner at 5 pm, goes to bed 9-11 am - fasting - at least many hours from b'fast - no Ca, Fe, PPIs - stopped multivitamins since last visit - not on Biotin On Fish oil, 3600 mg daily.  Reviewed her TFTs: Lab Results  Component Value Date   TSH 1.24 04/30/2020   TSH 6.12 (H) 03/18/2020   TSH 2.80 10/27/2019   TSH 4.54 (H) 09/13/2019   TSH 3.20 06/27/2019   TSH 6.16 (H) 04/26/2019   TSH 5.38 (H) 03/15/2019   TSH 0.37 06/24/2018   TSH 0.50 03/11/2018   TSH 1.06 01/24/2018   FREET4 0.76 04/30/2020   FREET4 0.72 03/18/2020   FREET4 0.61 10/27/2019   FREET4 0.61 09/13/2019   FREET4 0.72 06/27/2019   FREET4 0.58 (L) 04/26/2019   FREET4 0.66 03/15/2019   FREET4 0.56 (L) 06/24/2018   FREET4 0.53 (L) 03/11/2018   FREET4 0.64 01/24/2018   T3FREE 3.4 04/26/2019   T3FREE 3.4 03/15/2019   T3FREE 4.1 06/24/2018   T3FREE 3.7 03/11/2018   T3FREE 3.9 01/24/2018   T3FREE 3.7 03/24/2017   T3FREE 3.9 01/15/2017   T3FREE 3.5 12/09/2016   T3FREE 4.5 (H) 10/07/2016   T3FREE 3.9 08/31/2016  11/2018: TSH normal reportedly  Her TSI's were elevated: Lab Results  Component Value Date   TSI 418 (H) 03/15/2019   TSI 446 (H) 03/11/2018   TSI 507 (H) 05/01/2016   Pt denies: - feeling nodules in neck - hoarseness - dysphagia - choking - SOB with lying down  She has + FH of  thyroid disorders in: mother and sister (both with Graves ds. - s/p RAI tx). No FH of thyroid cancer. No h/o radiation tx to head or neck.  No herbal supplements. No Biotin use. No recent steroids use.   Pt. also has a history of HTN, HL, TAH, cholecystectomy.   Constitutional: no weight gain/no weight loss, no fatigue, + chronic subjective hyperthermia, no subjective hypothermia Eyes: no blurry vision, + xerophthalmia ENT: no sore throat, + see HPI Cardiovascular: no CP/no SOB/no palpitations/no leg swelling Respiratory: no cough/no SOB/no wheezing Gastrointestinal: no N/no V/no D/no  C/no acid reflux Musculoskeletal: no muscle aches/no joint aches Skin: no rashes, no hair loss Neurological: no tremors/no numbness/no tingling/no dizziness  I reviewed pt's medications, allergies, PMH, social hx, family hx, and changes were documented in the history of present illness. Otherwise, unchanged from my initial visit note.  Past Medical History:  Diagnosis Date  . Diverticulosis   . Dyslipidemia   . HTN (hypertension)   . Hypothyroid   . Skin disorder    Past Surgical History:  Procedure Laterality Date  . ABDOMINAL HYSTERECTOMY    . CHOLECYSTECTOMY    . COLONOSCOPY N/A 09/28/2018   Procedure: COLONOSCOPY;  Surgeon: Danie Binder, MD;  [facility];  Service: Endoscopy;  Laterality: N/A;  2:00pm  . POLYPECTOMY  09/28/2018   Procedure: POLYPECTOMY;  Surgeon: Danie Binder, MD;  [facility];  Service: Endoscopy;;  . TONSILLECTOMY     Social History   Social History  . Marital status: Single    Spouse name: N/A  . Number of children: 0   Occupational History  . retired Pharmacist, hospital Retired   Social History Main Topics  . Smoking status: former Smoker - quit 2003  . Smokeless tobacco: No       . Alcohol use No  . Drug use: No   Current Outpatient Medications on File Prior to Visit  Medication Sig Dispense Refill  . Aspirin-Acetaminophen (GOODYS BODY PAIN PO) Take 1 Package by mouth daily as needed (pain/headache).    Marland Kitchen atorvastatin (LIPITOR) 20 MG tablet Take 20 mg by mouth daily.    Marland Kitchen CRANBERRY CONCENTRATE PO Take 1 tablet by mouth once a week.    . fluticasone (FLONASE) 50 MCG/ACT nasal spray Place 2 sprays into both nostrils daily as needed.     . furosemide (LASIX) 20 MG tablet Take 20 mg by mouth daily.    Marland Kitchen levothyroxine (SYNTHROID) 50 MCG tablet Take 1 tablet (50 mcg total) by mouth daily before breakfast. 90 tablet 3  . losartan (COZAAR) 100 MG tablet TAKE 1 TABLET(100 MG) BY MOUTH DAILY 90 tablet 3  . tiZANidine (ZANAFLEX) 2 MG  tablet Take 2 mg by mouth every 6 (six) hours as needed for muscle spasms.     No current facility-administered medications on file prior to visit.   No Known Allergies Family History  Problem Relation Age of Onset  . AAA (abdominal aortic aneurysm) Mother   . Colon polyps Mother   . Lung cancer Father   . Thyroid disease Sister   . Colon cancer Neg Hx   . Inflammatory bowel disease Neg Hx     PE: There were no vitals taken for this visit. Wt Readings from Last 3 Encounters:  03/18/20 236 lb (107 kg)  02/09/20 241 lb 12.8 oz (109.7 kg)  01/19/20 236 lb (107 kg)   Constitutional: overweight, in NAD Eyes: PERRLA, EOMI, no exophthalmos ENT: moist mucous membranes, no  thyromegaly, no cervical lymphadenopathy Cardiovascular: RRR, No MRG Respiratory: CTA B Gastrointestinal: abdomen soft, NT, ND, BS+ Musculoskeletal: no deformities, strength intact in all 4 Skin: moist, warm, no rashes Neurological: no tremor with outstretched hands, DTR normal in all 4  ASSESSMENT: 1. Graves ds.  2.  Acquired hypothyroidism  PLAN:  1.  Graves' disease -Patient with an unusual history of hypothyroidism, followed by thyroid toxicity (confirmed as Graves' disease based on elevated TSI antibodies, absence of nodules on the thyroid ultrasound and protracted course).  She was on methimazole, doing well, until finally being able to stop completely in 08/2016.  Afterwards, her TSH trended up and we started levothyroxine low dose, 12.5 mcg daily.  TSH started to decrease again and we stopped her levothyroxine in 01/2018, but afterwards, we were able to stop completely in 08/2017.  She developed hypothyroidism again and we started levothyroxine -continues on this now.. -No signs of Graves' ophthalmopathy: No double vision, pain with eye movement, chemosis.  She has dry eyes.  She sees ophthalmology (Dr. Delman Cheadle). -No sign of thyrotoxicosis.  She does have chronic subjective hyperthermia, stable. -We will  continue to manage her expectantly  2. Acquired hypothyroidism  - At last visit, we increased her levothyroxine dose from 25 to 50 mcg daily. On 50 mcg daily, her TFTs were normal at last check. - latest thyroid labs reviewed with pt >> normal: Lab Results  Component Value Date   TSH 1.24 04/30/2020   - she continues on LT4 50 mcg daily, increased 03/2020 - pt feels good on this dose. - we discussed about taking the thyroid hormone every day, with water, >30 minutes before breakfast, separated by >4 hours from acid reflux medications, calcium, iron, multivitamins. Pt. is taking it correctly, but she moved it in the middle of the night, around 1 AM, and we did after the results are back, we may need to move it to morning.  Her dinner is early, though, so she may be absorbing levothyroxine well. - will check thyroid tests today: TSH and fT4 - If labs are abnormal, she will need to return for repeat TFTs in 1.5 months - OTW, I will see her back in 6 months  Prefers full tablets, if need increased in dose.  Component     Latest Ref Rng & Units 09/17/2020  TSH     0.35 - 4.50 uIU/mL 2.51  T4,Free(Direct)     0.60 - 1.60 ng/dL 0.80  Thyroid tests are at goal.  Philemon Kingdom, MD PhD [facility]

## 2020-11-19 DIAGNOSIS — I10 Essential (primary) hypertension: Secondary | ICD-10-CM | POA: Diagnosis not present

## 2020-11-23 DIAGNOSIS — J302 Other seasonal allergic rhinitis: Secondary | ICD-10-CM | POA: Diagnosis not present

## 2020-11-23 DIAGNOSIS — I10 Essential (primary) hypertension: Secondary | ICD-10-CM | POA: Diagnosis not present

## 2020-11-23 DIAGNOSIS — E039 Hypothyroidism, unspecified: Secondary | ICD-10-CM | POA: Diagnosis not present

## 2020-11-23 DIAGNOSIS — R809 Proteinuria, unspecified: Secondary | ICD-10-CM | POA: Diagnosis not present

## 2020-11-23 DIAGNOSIS — R7303 Prediabetes: Secondary | ICD-10-CM | POA: Diagnosis not present

## 2020-11-23 DIAGNOSIS — E0500 Thyrotoxicosis with diffuse goiter without thyrotoxic crisis or storm: Secondary | ICD-10-CM | POA: Diagnosis not present

## 2020-11-23 DIAGNOSIS — E782 Mixed hyperlipidemia: Secondary | ICD-10-CM | POA: Diagnosis not present

## 2020-12-22 ENCOUNTER — Inpatient Hospital Stay (HOSPITAL_COMMUNITY): Payer: Medicare PPO

## 2020-12-22 ENCOUNTER — Inpatient Hospital Stay (HOSPITAL_COMMUNITY)
Admission: EM | Admit: 2020-12-22 | Discharge: 2020-12-26 | DRG: 377 | Disposition: A | Discharge status: Home / Self Care | Payer: Medicare PPO | Attending: Family Medicine | Admitting: Family Medicine

## 2020-12-22 ENCOUNTER — Other Ambulatory Visit: Payer: Self-pay

## 2020-12-22 ENCOUNTER — Emergency Department (HOSPITAL_COMMUNITY): Payer: Medicare PPO

## 2020-12-22 ENCOUNTER — Encounter (HOSPITAL_COMMUNITY): Payer: Self-pay | Admitting: Emergency Medicine

## 2020-12-22 DIAGNOSIS — J9690 Respiratory failure, unspecified, unspecified whether with hypoxia or hypercapnia: Secondary | ICD-10-CM | POA: Diagnosis not present

## 2020-12-22 DIAGNOSIS — K921 Melena: Secondary | ICD-10-CM | POA: Diagnosis not present

## 2020-12-22 DIAGNOSIS — R0609 Other forms of dyspnea: Secondary | ICD-10-CM | POA: Diagnosis not present

## 2020-12-22 DIAGNOSIS — Z6841 Body Mass Index (BMI) 40.0 and over, adult: Secondary | ICD-10-CM | POA: Diagnosis not present

## 2020-12-22 DIAGNOSIS — E039 Hypothyroidism, unspecified: Secondary | ICD-10-CM | POA: Diagnosis present

## 2020-12-22 DIAGNOSIS — Z8349 Family history of other endocrine, nutritional and metabolic diseases: Secondary | ICD-10-CM

## 2020-12-22 DIAGNOSIS — I5033 Acute on chronic diastolic (congestive) heart failure: Secondary | ICD-10-CM | POA: Diagnosis present

## 2020-12-22 DIAGNOSIS — R42 Dizziness and giddiness: Secondary | ICD-10-CM | POA: Diagnosis not present

## 2020-12-22 DIAGNOSIS — I517 Cardiomegaly: Secondary | ICD-10-CM | POA: Diagnosis not present

## 2020-12-22 DIAGNOSIS — E785 Hyperlipidemia, unspecified: Secondary | ICD-10-CM | POA: Diagnosis present

## 2020-12-22 DIAGNOSIS — Z20822 Contact with and (suspected) exposure to covid-19: Secondary | ICD-10-CM | POA: Diagnosis not present

## 2020-12-22 DIAGNOSIS — I080 Rheumatic disorders of both mitral and aortic valves: Secondary | ICD-10-CM | POA: Diagnosis present

## 2020-12-22 DIAGNOSIS — Z9049 Acquired absence of other specified parts of digestive tract: Secondary | ICD-10-CM | POA: Diagnosis not present

## 2020-12-22 DIAGNOSIS — I890 Lymphedema, not elsewhere classified: Secondary | ICD-10-CM | POA: Diagnosis present

## 2020-12-22 DIAGNOSIS — I110 Hypertensive heart disease with heart failure: Secondary | ICD-10-CM | POA: Diagnosis present

## 2020-12-22 DIAGNOSIS — Z79890 Hormone replacement therapy (postmenopausal): Secondary | ICD-10-CM

## 2020-12-22 DIAGNOSIS — R778 Other specified abnormalities of plasma proteins: Secondary | ICD-10-CM | POA: Diagnosis not present

## 2020-12-22 DIAGNOSIS — Z90710 Acquired absence of both cervix and uterus: Secondary | ICD-10-CM | POA: Diagnosis not present

## 2020-12-22 DIAGNOSIS — K31811 Angiodysplasia of stomach and duodenum with bleeding: Secondary | ICD-10-CM | POA: Diagnosis not present

## 2020-12-22 DIAGNOSIS — R195 Other fecal abnormalities: Secondary | ICD-10-CM | POA: Diagnosis present

## 2020-12-22 DIAGNOSIS — J9601 Acute respiratory failure with hypoxia: Secondary | ICD-10-CM | POA: Diagnosis not present

## 2020-12-22 DIAGNOSIS — I421 Obstructive hypertrophic cardiomyopathy: Secondary | ICD-10-CM | POA: Diagnosis not present

## 2020-12-22 DIAGNOSIS — Z86010 Personal history of colonic polyps: Secondary | ICD-10-CM

## 2020-12-22 DIAGNOSIS — Z8371 Family history of colonic polyps: Secondary | ICD-10-CM

## 2020-12-22 DIAGNOSIS — J9811 Atelectasis: Secondary | ICD-10-CM | POA: Diagnosis not present

## 2020-12-22 DIAGNOSIS — I959 Hypotension, unspecified: Secondary | ICD-10-CM | POA: Diagnosis not present

## 2020-12-22 DIAGNOSIS — Z79899 Other long term (current) drug therapy: Secondary | ICD-10-CM

## 2020-12-22 DIAGNOSIS — R0902 Hypoxemia: Secondary | ICD-10-CM

## 2020-12-22 DIAGNOSIS — D62 Acute posthemorrhagic anemia: Secondary | ICD-10-CM | POA: Diagnosis not present

## 2020-12-22 DIAGNOSIS — K922 Gastrointestinal hemorrhage, unspecified: Secondary | ICD-10-CM

## 2020-12-22 DIAGNOSIS — Z87891 Personal history of nicotine dependence: Secondary | ICD-10-CM

## 2020-12-22 DIAGNOSIS — K3189 Other diseases of stomach and duodenum: Secondary | ICD-10-CM | POA: Diagnosis not present

## 2020-12-22 DIAGNOSIS — J811 Chronic pulmonary edema: Secondary | ICD-10-CM | POA: Diagnosis not present

## 2020-12-22 DIAGNOSIS — K760 Fatty (change of) liver, not elsewhere classified: Secondary | ICD-10-CM | POA: Diagnosis not present

## 2020-12-22 DIAGNOSIS — R450 Nervousness: Secondary | ICD-10-CM | POA: Diagnosis not present

## 2020-12-22 DIAGNOSIS — I440 Atrioventricular block, first degree: Secondary | ICD-10-CM | POA: Diagnosis not present

## 2020-12-22 DIAGNOSIS — R9431 Abnormal electrocardiogram [ECG] [EKG]: Secondary | ICD-10-CM | POA: Diagnosis not present

## 2020-12-22 DIAGNOSIS — R0602 Shortness of breath: Secondary | ICD-10-CM | POA: Diagnosis not present

## 2020-12-22 LAB — CBC WITH DIFFERENTIAL/PLATELET
Abs Immature Granulocytes: 0.17 10*3/uL — ABNORMAL HIGH (ref 0.00–0.07)
Basophils Absolute: 0.1 10*3/uL (ref 0.0–0.1)
Basophils Relative: 1 %
Eosinophils Absolute: 0 10*3/uL (ref 0.0–0.5)
Eosinophils Relative: 0 %
HCT: 21 % — ABNORMAL LOW (ref 36.0–46.0)
Hemoglobin: 6.3 g/dL — CL (ref 12.0–15.0)
Immature Granulocytes: 1 %
Lymphocytes Relative: 11 %
Lymphs Abs: 1.8 10*3/uL (ref 0.7–4.0)
MCH: 25.9 pg — ABNORMAL LOW (ref 26.0–34.0)
MCHC: 30 g/dL (ref 30.0–36.0)
MCV: 86.4 fL (ref 80.0–100.0)
Monocytes Absolute: 0.6 10*3/uL (ref 0.1–1.0)
Monocytes Relative: 4 %
Neutro Abs: 13.8 10*3/uL — ABNORMAL HIGH (ref 1.7–7.7)
Neutrophils Relative %: 83 %
Platelets: 337 10*3/uL (ref 150–400)
RBC: 2.43 MIL/uL — ABNORMAL LOW (ref 3.87–5.11)
RDW: 15.6 % — ABNORMAL HIGH (ref 11.5–15.5)
WBC: 16.4 10*3/uL — ABNORMAL HIGH (ref 4.0–10.5)
nRBC: 0.2 % (ref 0.0–0.2)

## 2020-12-22 LAB — COMPREHENSIVE METABOLIC PANEL
ALT: 12 U/L (ref 0–44)
AST: 17 U/L (ref 15–41)
Albumin: 3.6 g/dL (ref 3.5–5.0)
Alkaline Phosphatase: 45 U/L (ref 38–126)
Anion gap: 9 (ref 5–15)
BUN: 59 mg/dL — ABNORMAL HIGH (ref 8–23)
CO2: 19 mmol/L — ABNORMAL LOW (ref 22–32)
Calcium: 8.1 mg/dL — ABNORMAL LOW (ref 8.9–10.3)
Chloride: 112 mmol/L — ABNORMAL HIGH (ref 98–111)
Creatinine, Ser: 1 mg/dL (ref 0.44–1.00)
GFR, Estimated: >60 mL/min (ref >60)
Glucose, Bld: 143 mg/dL — ABNORMAL HIGH (ref 70–99)
Potassium: 3.8 mmol/L (ref 3.5–5.1)
Sodium: 140 mmol/L (ref 135–145)
Total Bilirubin: 0.4 mg/dL (ref 0.3–1.2)
Total Protein: 6.5 g/dL (ref 6.5–8.1)

## 2020-12-22 LAB — I-STAT CHEM 8, ED
BUN: 60 mg/dL — ABNORMAL HIGH (ref 8–23)
Calcium, Ion: 1.16 mmol/L (ref 1.15–1.40)
Chloride: 112 mmol/L — ABNORMAL HIGH (ref 98–111)
Creatinine, Ser: 0.9 mg/dL (ref 0.44–1.00)
Glucose, Bld: 138 mg/dL — ABNORMAL HIGH (ref 70–99)
HCT: 19 % — ABNORMAL LOW (ref 36.0–46.0)
Hemoglobin: 6.5 g/dL — CL (ref 12.0–15.0)
Potassium: 3.8 mmol/L (ref 3.5–5.1)
Sodium: 143 mmol/L (ref 135–145)
TCO2: 18 mmol/L — ABNORMAL LOW (ref 22–32)

## 2020-12-22 LAB — TROPONIN I (HIGH SENSITIVITY)
Troponin I (High Sensitivity): 36 ng/L — ABNORMAL HIGH (ref <18)
Troponin I (High Sensitivity): 112 ng/L (ref <18)

## 2020-12-22 LAB — RESP PANEL BY RT-PCR (FLU A&B, COVID) ARPGX2
Influenza A by PCR: NEGATIVE
Influenza B by PCR: NEGATIVE
SARS Coronavirus 2 by RT PCR: NEGATIVE

## 2020-12-22 LAB — PROTIME-INR
INR: 1.1 (ref 0.8–1.2)
Prothrombin Time: 13.9 seconds (ref 11.4–15.2)

## 2020-12-22 LAB — BRAIN NATRIURETIC PEPTIDE: B Natriuretic Peptide: 133 pg/mL — ABNORMAL HIGH (ref 0.0–100.0)

## 2020-12-22 LAB — PREPARE RBC (CROSSMATCH)

## 2020-12-22 LAB — ABO/RH: ABO/RH(D): O POS

## 2020-12-22 LAB — MAGNESIUM: Magnesium: 2.2 mg/dL (ref 1.7–2.4)

## 2020-12-22 MED ORDER — CHLORHEXIDINE GLUCONATE CLOTH 2 % EX PADS
6.0000 | MEDICATED_PAD | Freq: Every day | CUTANEOUS | Status: DC
  Administered 2020-12-23 – 2020-12-26 (×4): 6 via TOPICAL

## 2020-12-22 MED ORDER — FLUTICASONE PROPIONATE 50 MCG/ACT NA SUSP: 2.0000 | Freq: Every day | NASAL | Status: DC | PRN

## 2020-12-22 MED ORDER — SODIUM CHLORIDE 0.9 % IV SOLN: 10.0000 mL/h | Freq: Once | INTRAVENOUS | Status: DC

## 2020-12-22 MED ORDER — ONDANSETRON HCL 4 MG/2ML IJ SOLN: 4.0000 mg | Freq: Four times a day (QID) | INTRAMUSCULAR | Status: DC | PRN

## 2020-12-22 MED ORDER — PANTOPRAZOLE INFUSION (NEW) - SIMPLE MED
8.0000 mg/h | INTRAVENOUS | Status: DC
  Administered 2020-12-22 – 2020-12-24 (×6): 8 mg/h via INTRAVENOUS
  Filled 2020-12-22 (×2): qty 80
  Filled 2020-12-22: qty 100
  Filled 2020-12-22 (×3): qty 80
  Filled 2020-12-22: qty 100
  Filled 2020-12-22: qty 80

## 2020-12-22 MED ORDER — LEVOTHYROXINE SODIUM 25 MCG PO TABS
50.0000 ug | ORAL_TABLET | Freq: Every day | ORAL | Status: DC
  Administered 2020-12-23 – 2020-12-26 (×4): 50 ug via ORAL
  Filled 2020-12-22 (×4): qty 2

## 2020-12-22 MED ORDER — PANTOPRAZOLE SODIUM 40 MG IV SOLR
40.0000 mg | Freq: Once | INTRAVENOUS | Status: AC
  Administered 2020-12-22: 40 mg via INTRAVENOUS
  Filled 2020-12-22: qty 40

## 2020-12-22 MED ORDER — OCTREOTIDE LOAD VIA INFUSION
50.0000 ug | Freq: Once | INTRAVENOUS | Status: AC
  Administered 2020-12-22: 50 ug via INTRAVENOUS
  Filled 2020-12-22: qty 25

## 2020-12-22 MED ORDER — SODIUM CHLORIDE 0.9 % IV SOLN
50.0000 ug/h | INTRAVENOUS | Status: DC
  Administered 2020-12-22 – 2020-12-24 (×5): 50 ug/h via INTRAVENOUS
  Filled 2020-12-22 (×8): qty 1

## 2020-12-22 MED ORDER — SODIUM CHLORIDE 0.9 % IV SOLN: 250.0000 mL | INTRAVENOUS | Status: DC | PRN

## 2020-12-22 MED ORDER — ACETAMINOPHEN 650 MG RE SUPP: 650.0000 mg | Freq: Four times a day (QID) | RECTAL | Status: DC | PRN

## 2020-12-22 MED ORDER — ALBUTEROL SULFATE HFA 108 (90 BASE) MCG/ACT IN AERS: 1.0000 | INHALATION_SPRAY | RESPIRATORY_TRACT | Status: DC | PRN

## 2020-12-22 MED ORDER — SODIUM CHLORIDE 0.9 % IV BOLUS
1000.0000 mL | Freq: Once | INTRAVENOUS | Status: AC
  Administered 2020-12-22: 1000 mL via INTRAVENOUS

## 2020-12-22 MED ORDER — ONDANSETRON HCL 4 MG PO TABS: 4.0000 mg | ORAL_TABLET | Freq: Four times a day (QID) | ORAL | Status: DC | PRN

## 2020-12-22 MED ORDER — FUROSEMIDE 10 MG/ML IJ SOLN
20.0000 mg | Freq: Once | INTRAMUSCULAR | Status: AC
  Administered 2020-12-22: 20 mg via INTRAVENOUS
  Filled 2020-12-22: qty 2

## 2020-12-22 MED ORDER — SODIUM CHLORIDE 0.9% FLUSH: 3.0000 mL | INTRAVENOUS | Status: DC | PRN

## 2020-12-22 MED ORDER — ACETAMINOPHEN 325 MG PO TABS: 650.0000 mg | ORAL_TABLET | Freq: Four times a day (QID) | ORAL | Status: DC | PRN

## 2020-12-22 MED ORDER — SODIUM CHLORIDE 0.9% FLUSH
3.0000 mL | Freq: Two times a day (BID) | INTRAVENOUS | Status: DC
  Administered 2020-12-23 – 2020-12-26 (×6): 3 mL via INTRAVENOUS

## 2020-12-22 NOTE — ED Notes (Signed)
2 placed on 2L at this time.

## 2020-12-22 NOTE — ED Notes (Signed)
Dr. Roderic Palau notified of hgb results

## 2020-12-22 NOTE — Progress Notes (Addendum)
PT arrived to ICU from ED.  Pt assessed by RT. BBS clear, sats 96% on 3L [place].  No distress noted.  Pt stated her breathing felt better.  RT will continue to monitor if bipap is needed due to her fluid resuscitation.

## 2020-12-22 NOTE — ED Provider Notes (Signed)
[facility] EMERGENCY DEPARTMENT Provider Note   CSN: [id number] Arrival date & time: 12/22/20  8144     History Chief Complaint  Patient presents with   Hypotension    Bethany Cunningham is a 70 y.o. female.  Patient states that yesterday she started having black stool per rectum.  She also complains of weakness and she has been having shortness of breath for a while  The history is provided by the patient and medical records. No language interpreter was used.  Weakness Severity:  Moderate Onset quality:  Sudden Duration: A number days. Timing:  Constant Progression:  Waxing and waning Chronicity:  New Context: alcohol use   Relieved by:  Nothing Worsened by:  Nothing Associated symptoms: no abdominal pain, no chest pain, no cough, no diarrhea, no frequency, no headaches and no seizures       Past Medical History:  Diagnosis Date   Diverticulosis    Dyslipidemia    HTN (hypertension)    Hypothyroid    Skin disorder     Patient Active Problem List   Diagnosis Date Noted   Acute blood loss anemia 12/22/2020   Fatty liver 09/06/2018   Positive colorectal cancer screening using Cologuard test 09/06/2018   Graves disease 05/05/2016   Rectal bleeding 06/19/2013   Abnormal LFTs 06/19/2013   Hypothyroidism (acquired)    HTN (hypertension)    Dyslipidemia    Skin disorder     Past Surgical History:  Procedure Laterality Date   ABDOMINAL HYSTERECTOMY     CHOLECYSTECTOMY     COLONOSCOPY N/A 09/28/2018   Procedure: COLONOSCOPY;  Surgeon: Danie Binder, MD;  [facility]: [facility];  Service: Endoscopy;  Laterality: N/A;  2:00pm   POLYPECTOMY  09/28/2018   Procedure: POLYPECTOMY;  Surgeon: Danie Binder, MD;  Location: [facility];  Service: Endoscopy;;   TONSILLECTOMY       OB History     Gravida  0   Para  0   Term  0   Preterm  0   AB  0   Living  0      SAB  0   IAB  0   Ectopic  0   Multiple  0   Live Births  0            Family History  Problem Relation Age of Onset   AAA (abdominal aortic aneurysm) Mother    Colon polyps Mother    Lung cancer Father    Thyroid disease Sister    Colon cancer Neg Hx    Inflammatory bowel disease Neg Hx     Social History   Tobacco Use   Smoking status: Former    Pack years: 0.00    Types: Cigarettes    Quit date: 07/06/2001    Years since quitting: 19.4   Smokeless tobacco: Never   Tobacco comments:    Former smoker/ quit x 8 years  Vaping Use   Vaping Use: Never used  Substance Use Topics   Alcohol use: Yes    Comment: occas   Drug use: No    Home Medications Prior to Admission medications   Medication Sig Start Date End Date Taking? Authorizing Provider  amLODipine (NORVASC) 10 MG tablet Take 10 mg by mouth daily.    [provider]  Aspirin-Acetaminophen (GOODYS BODY PAIN PO) Take 1 Package by mouth daily as needed (pain/headache).    [provider]  atorvastatin (LIPITOR) 20 MG tablet Take 20  mg by mouth daily. 06/03/19   [provider]  CRANBERRY CONCENTRATE PO Take 1 tablet by mouth once a week.    [provider]  fluticasone (FLONASE) 50 MCG/ACT nasal spray Place 2 sprays into both nostrils daily as needed.  06/27/19   [provider]  furosemide (LASIX) 20 MG tablet Take 20 mg by mouth daily.    [provider]  levothyroxine (SYNTHROID) 50 MCG tablet Take 1 tablet (50 mcg total) by mouth daily before breakfast. 03/29/20   Philemon Kingdom, MD  losartan (COZAAR) 100 MG tablet TAKE 1 TABLET(100 MG) BY MOUTH DAILY 08/14/20   Arnoldo Lenis, MD  tiZANidine (ZANAFLEX) 2 MG tablet Take 2 mg by mouth every 6 (six) hours as needed for muscle spasms.    [provider]    Allergies    Patient has no known allergies.  Review of Systems   Review of Systems  Constitutional:  Negative for appetite change and fatigue.  HENT:  Negative for congestion, ear discharge and sinus pressure.    Eyes:  Negative for discharge.  Respiratory:  Negative for cough.   Cardiovascular:  Negative for chest pain.  Gastrointestinal:  Negative for abdominal pain and diarrhea.       Rectal bleeding  Genitourinary:  Negative for frequency and hematuria.  Musculoskeletal:  Negative for back pain.  Skin:  Negative for rash.  Neurological:  Positive for weakness. Negative for seizures and headaches.  Psychiatric/Behavioral:  Negative for hallucinations.    Physical Exam Updated Vital Signs BP (!) 102/41   Pulse (!) 104   Temp 98 F (36.7 C) (Oral)   Resp (!) 23   Ht 5\' 2"  (1.575 m)   Wt 108.9 kg   SpO2 95%   BMI 43.90 kg/m   Physical Exam Vitals and nursing note reviewed.  Constitutional:      Appearance: She is well-developed.  HENT:     Head: Normocephalic.     Nose: Nose normal.  Eyes:     General: No scleral icterus.    Conjunctiva/sclera: Conjunctivae normal.  Neck:     Thyroid: No thyromegaly.  Cardiovascular:     Rate and Rhythm: Normal rate and regular rhythm.     Heart sounds: No murmur heard.   No friction rub. No gallop.  Pulmonary:     Breath sounds: No stridor. No wheezing or rales.  Chest:     Chest wall: No tenderness.  Abdominal:     General: There is no distension.     Tenderness: There is no abdominal tenderness. There is no rebound.  Genitourinary:    Comments: Black stool that was heme positive Musculoskeletal:        General: Normal range of motion.     Cervical back: Neck supple.  Lymphadenopathy:     Cervical: No cervical adenopathy.  Skin:    Findings: No erythema or rash.  Neurological:     Mental Status: She is alert and oriented to person, place, and time.     Motor: No abnormal muscle tone.     Coordination: Coordination normal.  Psychiatric:        Behavior: Behavior normal.    ED Results / Procedures / Treatments   Labs (all labs ordered are listed, but only abnormal results are displayed) Labs Reviewed  CBC WITH  DIFFERENTIAL/PLATELET - Abnormal; Notable for the following components:      Result Value   WBC 16.4 (*)    RBC 2.43 (*)  Hemoglobin 6.3 (*)    HCT 21.0 (*)    MCH 25.9 (*)    RDW 15.6 (*)    Neutro Abs 13.8 (*)    Abs Immature Granulocytes 0.17 (*)    All other components within normal limits  COMPREHENSIVE METABOLIC PANEL - Abnormal; Notable for the following components:   Chloride 112 (*)    CO2 19 (*)    Glucose, Bld 143 (*)    BUN 59 (*)    Calcium 8.1 (*)    All other components within normal limits  BRAIN NATRIURETIC PEPTIDE - Abnormal; Notable for the following components:   B Natriuretic Peptide 133.0 (*)    All other components within normal limits  I-STAT CHEM 8, ED - Abnormal; Notable for the following components:   Chloride 112 (*)    BUN 60 (*)    Glucose, Bld 138 (*)    TCO2 18 (*)    Hemoglobin 6.5 (*)    HCT 19.0 (*)    All other components within normal limits  TROPONIN I (HIGH SENSITIVITY) - Abnormal; Notable for the following components:   Troponin I (High Sensitivity) 36 (*)    All other components within normal limits  RESP PANEL BY RT-PCR (FLU A&B, COVID) ARPGX2  OCCULT BLOOD X 1 CARD TO LAB, STOOL  TYPE AND SCREEN  PREPARE RBC (CROSSMATCH)  ABO/RH  TROPONIN I (HIGH SENSITIVITY)    EKG EKG Interpretation  Date/Time:  Sunday December 22 2020 08:33:12 EDT Ventricular Rate:  97 PR Interval:  212 QRS Duration: 110 QT Interval:  382 QTC Calculation: 486 R Axis:   15 Text Interpretation: Sinus rhythm Borderline prolonged PR interval RSR' in V1 or V2, right VCD or RVH Repol abnrm suggests ischemia, lateral leads Confirmed by Milton Ferguson [phone]) on 12/22/2020 8:39:51 AM  Radiology DG Chest Port 1 View  Result Date: 12/22/2020 CLINICAL DATA:  Shortness of breath. Shortness of breath with exertion, black stools, dizziness when standing. EXAM: PORTABLE CHEST 1 VIEW COMPARISON:  Prior chest radiographs 01/02/2010 and earlier. FINDINGS: Cardiomegaly,  unchanged. Central pulmonary vascular congestion and mild interstitial edema. Mild opacity within the lateral left lung base. No appreciable consolidation within the right lung. No evidence of pleural effusion or pneumothorax. No acute bony abnormality identified. IMPRESSION: Cardiomegaly. Pulmonary vascular congestion and mild interstitial edema. Mild opacity within the lateral left lung base, favored to reflect atelectasis. Pneumonia is difficult to definitively exclude and clinical correlation is recommended. Electronically Signed   By: Kellie Simmering DO   On: 12/22/2020 09:28    Procedures Procedures   Medications Ordered in ED Medications  0.9 %  sodium chloride infusion (has no administration in time range)  pantoprozole (PROTONIX) 80 mg /NS 100 mL infusion (8 mg/hr Intravenous New Bag/Given 12/22/20 1040)  sodium chloride 0.9 % bolus 1,000 mL (1,000 mLs Intravenous New Bag/Given 12/22/20 0840)  pantoprazole (PROTONIX) injection 40 mg (40 mg Intravenous Given 12/22/20 2947)    ED Course  I have reviewed the triage vital signs and the nursing notes.  Pertinent labs & imaging results that were available during my care of the patient were reviewed by me and considered in my medical decision making (see chart for details). CRITICAL CARE Performed by: Milton Ferguson Total critical care time: 45 minutes Critical care time was exclusive of separately billable procedures and treating other patients. Critical care was necessary to treat or prevent imminent or life-threatening deterioration. Critical care was time spent personally by me on the following activities:  development of treatment plan with patient and/or surrogate as well as nursing, discussions with consultants, evaluation of patient's response to treatment, examination of patient, obtaining history from patient or surrogate, ordering and performing treatments and interventions, ordering and review of laboratory studies, ordering and review of  radiographic studies, pulse oximetry and re-evaluation of patient's condition. Patient with upper GI bleed.  I got in touch with gastroenterology Dr. Melony Overly and he will see the patient today.  We will admit her to medicine and transfuse her 2 units of blood   MDM Rules/Calculators/A&P                          Upper GI bleed with anemia. Final Clinical Impression(s) / ED Diagnoses Final diagnoses:  Upper GI bleed    Rx / DC Orders ED Discharge Orders     None        Milton Ferguson, MD 12/22/20 1051

## 2020-12-22 NOTE — ED Notes (Addendum)
Date and time results received: 12/22/20 4:32 PM  (use smartphrase ".now" to insert current time)  Test: trop Critical Value: 112  Name of Provider Notified: Dr. Manuella Ghazi  Orders Received? Or Actions Taken?: See chart

## 2020-12-22 NOTE — H&P (Signed)
History and Physical    Bethany Cunningham EGB:[id number] DOB: 1950/09/19 DOA: 12/22/2020  PCP: Celene Squibb, MD   Patient coming from: Home  Chief Complaint: Dark stools/near syncope  HPI: Bethany Cunningham is a 70 y.o. female with medical history significant for morbid obesity, hypertension, dyslipidemia, and hypothyroidism who presented to the ED with complaints of black, tarry stools that began on 6/18.  She denied any abdominal pain, diarrhea, or vomiting.  Early on the morning of admission, she was noted to have worsening dizziness, nausea, and dyspnea on exertion.  She denies any recent illness, fevers, or chills.  She denies any chest pain.  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  She denies any significant alcohol use or tobacco abuse.   ED Course: Vital signs initially noted to show some hypotension with tachycardia.  She is being started on 2 unit PRBC transfusion.  She has been given a 1 L fluid bolus and has been started on Protonix infusion.  Her hemoglobin is noted to be 6.5 and was approximately 14 previously.  Chest x-ray with some mild cardiomegaly and pulmonary vascular congestion.  She is currently on room air with no respiratory distress.  BUN elevated at 60.  Review of Systems: Reviewed as noted above, otherwise negative.  Past Medical History:  Diagnosis Date   Diverticulosis    Dyslipidemia    HTN (hypertension)    Hypothyroid    Skin disorder     Past Surgical History:  Procedure Laterality Date   ABDOMINAL HYSTERECTOMY     CHOLECYSTECTOMY     COLONOSCOPY N/A 09/28/2018   Procedure: COLONOSCOPY;  Surgeon: Danie Binder, MD;  Location: [facility];  Service: Endoscopy;  Laterality: N/A;  2:00pm   POLYPECTOMY  09/28/2018   Procedure: POLYPECTOMY;  Surgeon: Danie Binder, MD;  Location: [facility];  Service:  Endoscopy;;   TONSILLECTOMY       reports that she quit smoking about 19 years ago. She has never used smokeless tobacco. She reports current alcohol use. She reports that she does not use drugs.  No Known Allergies  Family History  Problem Relation Age of Onset   AAA (abdominal aortic aneurysm) Mother    Cunningham polyps Mother    Lung cancer Father    Thyroid disease Sister    Cunningham cancer Neg Hx    Inflammatory bowel disease Neg Hx     Prior to Admission medications   Medication Sig Start Date End Date Taking? Authorizing Provider  amLODipine (NORVASC) 10 MG tablet Take 10 mg by mouth daily.    [provider]  Aspirin-Acetaminophen (GOODYS BODY PAIN PO) Take 1 Package by mouth daily as needed (pain/headache).    [provider]  atorvastatin (LIPITOR) 20 MG tablet Take 20 mg by mouth daily. 06/03/19   [provider]  CRANBERRY CONCENTRATE PO Take 1 tablet by mouth once a week.    [provider]  fluticasone (FLONASE) 50 MCG/ACT nasal spray Place 2 sprays into both nostrils daily as needed.  06/27/19   [provider]  furosemide (LASIX) 20 MG tablet Take 20 mg by mouth daily.    [provider]  levothyroxine (SYNTHROID) 50 MCG tablet Take 1 tablet (50 mcg total) by mouth daily before breakfast. 03/29/20   Philemon Kingdom, MD  losartan (COZAAR) 100 MG tablet TAKE 1 TABLET(100 MG) BY MOUTH DAILY 08/14/20   Arnoldo Lenis, MD  tiZANidine (ZANAFLEX) 2 MG tablet Take 2 mg by mouth every 6 (six) hours as needed for muscle spasms.    [provider]    Physical Exam: Vitals:   12/22/20 0945 12/22/20 1031 12/22/20 1035 12/22/20 1114  BP: (!) 111/56 (!) 90/54 (!) 102/41 (!) 98/35  Pulse: 93 (!) 108 (!) 104 (!) 105  Resp: (!) 25 (!) 22 (!) 23 (!) 23  Temp:    98.5 F (36.9 C)  TempSrc:    Oral  SpO2: 90% 100% 95% 94%  Weight:      Height:        Constitutional: NAD, calm, comfortable, obese Vitals:   12/22/20  0945 12/22/20 1031 12/22/20 1035 12/22/20 1114  BP: (!) 111/56 (!) 90/54 (!) 102/41 (!) 98/35  Pulse: 93 (!) 108 (!) 104 (!) 105  Resp: (!) 25 (!) 22 (!) 23 (!) 23  Temp:    98.5 F (36.9 C)  TempSrc:    Oral  SpO2: 90% 100% 95% 94%  Weight:      Height:       Eyes: lids and conjunctivae normal Neck: normal, supple Respiratory: clear to auscultation bilaterally. Normal respiratory effort. No accessory muscle use.  Cardiovascular: Regular rate and rhythm, no murmurs. Abdomen: no tenderness, no distention. Bowel sounds positive.  Musculoskeletal:  No edema. Skin: no rashes, lesions, ulcers.  Psychiatric: Flat affect  Labs on Admission: I have personally reviewed following labs and imaging studies  CBC: Recent Labs  Lab 12/22/20 0843 12/22/20 0852  WBC 16.4*  --   NEUTROABS 13.8*  --   HGB 6.3* 6.5*  HCT 21.0* 19.0*  MCV 86.4  --   PLT 337  --    Basic Metabolic Panel: Recent Labs  Lab 12/22/20 0843 12/22/20 0852  NA 140 143  K 3.8 3.8  CL 112* 112*  CO2 19*  --   GLUCOSE 143* 138*  BUN 59* 60*  CREATININE 1.00 0.90  CALCIUM 8.1*  --    GFR: Estimated Creatinine Clearance: 66.6 mL/min (by C-G formula based on SCr of 0.9 mg/dL). Liver Function Tests: Recent Labs  Lab 12/22/20 0843  AST 17  ALT 12  ALKPHOS 45  BILITOT 0.4  PROT 6.5  ALBUMIN 3.6   No results for input(s): LIPASE, AMYLASE in the last 168 hours. No results for input(s): AMMONIA in the last 168 hours. Coagulation Profile: No results for input(s): INR, PROTIME in the last 168 hours. Cardiac Enzymes: No results for input(s): CKTOTAL, CKMB, CKMBINDEX, TROPONINI in the last 168 hours. BNP (last 3 results) No results for input(s): PROBNP in the last 8760 hours. HbA1C: No results for input(s): HGBA1C in the last 72 hours. CBG: No results for input(s): GLUCAP in the last 168 hours. Lipid Profile: No results for input(s): CHOL, HDL, LDLCALC, TRIG, CHOLHDL, LDLDIRECT in the last 72  hours. Thyroid Function Tests: No results for input(s): TSH, T4TOTAL, FREET4, T3FREE, THYROIDAB in the last 72 hours. Anemia Panel: No results for input(s): VITAMINB12, FOLATE, FERRITIN, TIBC, IRON, RETICCTPCT in the last 72 hours. Urine analysis:    Component Value Date/Time   COLORURINE YELLOW 05/13/2013 2003   APPEARANCEUR CLEAR 05/13/2013 2003   LABSPEC 1.020 05/13/2013 2003   PHURINE 6.0 05/13/2013 2003   GLUCOSEU NEGATIVE 05/13/2013 2003   HGBUR MODERATE (A) 05/13/2013 2003   BILIRUBINUR NEGATIVE 05/13/2013 2003   KETONESUR NEGATIVE 05/13/2013  2003   PROTEINUR TRACE (A) 05/13/2013 2003   UROBILINOGEN 0.2 05/13/2013 2003   NITRITE NEGATIVE 05/13/2013 2003   LEUKOCYTESUR NEGATIVE 05/13/2013 2003    Radiological Exams on Admission: DG Chest Port 1 View  Result Date: 12/22/2020 CLINICAL DATA:  Shortness of breath. Shortness of breath with exertion, black stools, dizziness when standing. EXAM: PORTABLE CHEST 1 VIEW COMPARISON:  Prior chest radiographs 01/02/2010 and earlier. FINDINGS: Cardiomegaly, unchanged. Central pulmonary vascular congestion and mild interstitial edema. Mild opacity within the lateral left lung base. No appreciable consolidation within the right lung. No evidence of pleural effusion or pneumothorax. No acute bony abnormality identified. IMPRESSION: Cardiomegaly. Pulmonary vascular congestion and mild interstitial edema. Mild opacity within the lateral left lung base, favored to reflect atelectasis. Pneumonia is difficult to definitively exclude and clinical correlation is recommended. Electronically Signed   By: Kellie Simmering DO   On: 12/22/2020 09:28    EKG: Independently reviewed. SR 97bpm.  Assessment/Plan Active Problems:   Acute blood loss anemia    Acute blood loss anemia suspected secondary to upper GI bleed -Plan to transfuse 2 unit PRBCs, vital signs currently stable -Clear liquid diet and n.p.o. after midnight -GI consult with plans for upper  endoscopy in a.m. -Remote use of Goody powders -Monitor repeat H/H -Protonix infusion  Dyspnea on exertion/near syncope -Likely secondary to worsening anemia -Chest x-ray with some cardiomegaly and pulmonary vascular congestion noted -Prior 2D echocardiogram and stress test on 01/2018 with no significant findings -Reevaluate after PRBC transfusion  History of hypertension -Currently hypotensive, hold home medications  Dyslipidemia -Hold statin and fish oils  History of hypothyroidism -Continue levothyroxine  Morbid obesity -Lifestyle changes outpatient   DVT prophylaxis: SCDs Code Status: Full Family Communication: None at bedside Disposition Plan:Admit for GI bleed evaluation Consults called:GI Admission status:Inpatient, Tele    Antigone Crowell D Alegandra Sommers DO [facility]  If 7PM-7AM, please contact night-coverage [email or web address]  12/22/2020, 11:19 AM

## 2020-12-22 NOTE — ED Triage Notes (Addendum)
Pt to the ED [facility] with hypotension, shortness of breath with exertion, black stools,and dizziness when standing.  Pt received 500 ml bolus in route.

## 2020-12-22 NOTE — Consult Note (Signed)
Referring Provider: Milton Ferguson, MD Primary Care Physician:  Celene Squibb, MD Primary Gastroenterologist:  Dr. Laural Golden  Reason for Consultation:    Melena.  HPI:   Bethany Cunningham is 70 year old Caucasian female who was in usual state of health until yesterday when she had three tarry stools.  She thought color change was due to what she had eaten.  She had not use any Pepto-Bismol.  She woke up around 2:00 this morning to void but she also ended up having black tarry stool.  She felt lightheaded and dizzy.  She decided to go back to bed.  She called 911 around 7 AM and she was brought to emergency room.  On arrival she was noted to be hypotensive and tachycardic.  Blood pressure improved with IV fluids. Lab studies are pertinent for WBC of 16.4 with H&H of 6.3 and 21 with MCV of 86.4 and platelet count of 337.  Comprehensive chemistry panel revealed BUN of 59 and a creatinine of 1.0.  Transaminases normal.  Bethany Cunningham was begun on pantoprazole infusion after she received a bolus.  She is also receiving blood transfusion.  INR has not been performed yet. Bethany Cunningham did not experience nausea vomiting or abdominal pain with melena but she states she has had intermittent periumbilical pain for the last 50 years.  Pain is never intense and does not last long.  She says when she talk with her physician no work-up was recommended.  She has had good appetite.  She says she is trying hard to lose weight.  She has lost 10 pounds this year.  She is vegetarian.  She eats fish once every 3 months as recommended to her by her physician.  She has been very active.  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 She states she has BC/Goody powder at home but the last time she took it was 8 months ago. She had a diagnostic colonoscopy by Dr. Barney Drain and 2018/10/12 for positive Cologuard test.  She had multiple polyps Fortovase for sessile serrated polyps and some are tubular adenomas and  11 small polyps in rectosigmoid area were hyperplastic.  She is due for next colonoscopy in 10/11/2021.  Her mother died of ruptured AAA at age 4.  Her father died of lung carcinoma within 3 months of diagnosis.  He was 72 years old.  He was Sales promotion account executive by trade and obtained his PhD from [employer] at age 53.  She has a brother age 72 in good health and she has sister 42 who recently had surgery at [facility] possibly for hiatal hernia.  Bethany Cunningham is single.  She never married and does not have any children.  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  She smokes cigarettes about a pack a day for 30 years and quit 20 years ago.  Past Medical History:  Diagnosis Date   Diverticulosis    Dyslipidemia    HTN (hypertension)    She has a history of Graves' disease; she was treated with methimazole and now she is hypothyroid and under care of Dr. Pecola Lawless           Fatty liver.  AST of 75 and ALT of 72 in June 2011.  CT in November 2014 revealed severe fatty liver.     Obesity.  history of multiple colonic polyps.  She had colonoscopy in March 2020 for positive Cologuard.  She had multiple polyps some over 20 mm.  4 of these are sessile serrated polyps and some tubular adenomas and 11 small polyps at rectosigmoid are hyperplastic.  Past Surgical History:  Procedure Laterality Date   ABDOMINAL HYSTERECTOMY     CHOLECYSTECTOMY     COLONOSCOPY N/A 09/28/2018   Procedure: COLONOSCOPY;  Surgeon: Danie Binder, MD;  Location: [facility];  Service: Endoscopy;  Laterality: N/A;  2:00pm   POLYPECTOMY  09/28/2018   Procedure: POLYPECTOMY;  Surgeon: Danie Binder, MD;  Location: [facility];  Service: Endoscopy;;   TONSILLECTOMY      Prior to Admission medications   Medication Sig Start Date End Date Taking? Authorizing Provider  amLODipine (NORVASC) 10 MG tablet Take 10 mg by mouth  daily.    [provider]  Aspirin-Acetaminophen (GOODYS BODY PAIN PO) Take 1 Package by mouth daily as needed (pain/headache).    [provider]  atorvastatin (LIPITOR) 20 MG tablet Take 20 mg by mouth daily. 06/03/19   [provider]  CRANBERRY CONCENTRATE PO Take 1 tablet by mouth once a week.    [provider]  fluticasone (FLONASE) 50 MCG/ACT nasal spray Place 2 sprays into both nostrils daily as needed.  06/27/19   [provider]  furosemide (LASIX) 20 MG tablet Take 20 mg by mouth daily.    [provider]  levothyroxine (SYNTHROID) 50 MCG tablet Take 1 tablet (50 mcg total) by mouth daily before breakfast. 03/29/20   Philemon Kingdom, MD  losartan (COZAAR) 100 MG tablet TAKE 1 TABLET(100 MG) BY MOUTH DAILY 08/14/20   Arnoldo Lenis, MD  tiZANidine (ZANAFLEX) 2 MG tablet Take 2 mg by mouth every 6 (six) hours as needed for muscle spasms.    [provider]    Current Facility-Administered Medications  Medication Dose Route Frequency Provider Last Rate Last Admin   0.9 %  sodium chloride infusion  10 mL/hr Intravenous Once Manuella Ghazi, Pratik D, DO       0.9 %  sodium chloride infusion  250 mL Intravenous PRN Manuella Ghazi, Pratik D, DO       acetaminophen (TYLENOL) tablet 650 mg  650 mg Oral Q6H PRN Manuella Ghazi, Pratik D, DO       Or   acetaminophen (TYLENOL) suppository 650 mg  650 mg Rectal Q6H PRN Manuella Ghazi, Pratik D, DO       fluticasone (FLONASE) 50 MCG/ACT nasal spray 2 spray  2 spray Each Nare Daily PRN Manuella Ghazi, Pratik D, DO       [START ON 12/23/2020] levothyroxine (SYNTHROID) tablet 50 mcg  50 mcg Oral QAC breakfast Manuella Ghazi, Pratik D, DO       ondansetron (ZOFRAN) tablet 4 mg  4 mg Oral Q6H PRN Manuella Ghazi, Pratik D, DO       Or   ondansetron (ZOFRAN) injection 4 mg  4 mg Intravenous Q6H PRN Manuella Ghazi, Pratik D, DO       pantoprozole (PROTONIX) 80 mg /NS 100 mL infusion  8 mg/hr Intravenous Continuous Manuella Ghazi, Pratik D, DO 10 mL/hr at 12/22/20 1040 8 mg/hr at  12/22/20 1040   sodium chloride flush (NS) 0.9 % injection 3 mL  3 mL Intravenous Q12H Shah, Pratik D, DO       sodium chloride flush (NS) 0.9 % injection 3 mL  3 mL Intravenous PRN Manuella Ghazi, Pratik D, DO  Current Outpatient Medications  Medication Sig Dispense Refill   amLODipine (NORVASC) 10 MG tablet Take 10 mg by mouth daily.     Aspirin-Acetaminophen (GOODYS BODY PAIN PO) Take 1 Package by mouth daily as needed (pain/headache).     atorvastatin (LIPITOR) 20 MG tablet Take 20 mg by mouth daily.     CRANBERRY CONCENTRATE PO Take 1 tablet by mouth once a week.     fluticasone (FLONASE) 50 MCG/ACT nasal spray Place 2 sprays into both nostrils daily as needed.      furosemide (LASIX) 20 MG tablet Take 20 mg by mouth daily.     levothyroxine (SYNTHROID) 50 MCG tablet Take 1 tablet (50 mcg total) by mouth daily before breakfast. 90 tablet 3   losartan (COZAAR) 100 MG tablet TAKE 1 TABLET(100 MG) BY MOUTH DAILY 90 tablet 3   tiZANidine (ZANAFLEX) 2 MG tablet Take 2 mg by mouth every 6 (six) hours as needed for muscle spasms.      Allergies as of 12/22/2020   (No Known Allergies)    Family History  Problem Relation Age of Onset   AAA (abdominal aortic aneurysm) Mother    Colon polyps Mother    Lung cancer Father    Thyroid disease Sister    Colon cancer Neg Hx    Inflammatory bowel disease Neg Hx     Social History   Socioeconomic History   Marital status: Single    Spouse name: Not on file   Number of children: 0   Years of education: Not on file   Highest education level: Not on file  Occupational History   Occupation: retired Product manager: RETIRED  Tobacco Use   Smoking status: Former    Pack years: 0.00    Types: Cigarettes    Quit date: 07/06/2001    Years since quitting: 19.4   Smokeless tobacco: Never   Tobacco comments:    Former smoker/ quit x 8 years  Vaping Use   Vaping Use: Never used  Substance and Sexual Activity   Alcohol use: Yes    Comment:  occas   Drug use: No   Sexual activity: Yes    Birth control/protection: Surgical  Other Topics Concern   Not on file  Social History Narrative   Not on file   Social Determinants of Health   Financial Resource Strain: Not on file  Food Insecurity: Not on file  Transportation Needs: Not on file  Physical Activity: Not on file  Stress: Not on file  Social Connections: Not on file  Intimate Partner Violence: Not on file    Review of Systems: See HPI, otherwise normal ROS  Physical Exam: Temp:  [98 F (36.7 C)-98.5 F (36.9 C)] 98.4 F (36.9 C) (06/19 1132) Pulse Rate:  [93-108] 107 (06/19 1132) Resp:  [16-25] 24 (06/19 1132) BP: (90-118)/(35-56) 102/49 (06/19 1132) SpO2:  [90 %-100 %] 94 % (06/19 1114) Weight:  [108.9 kg] 108.9 kg (06/19 0822)   Bethany Cunningham is alert and in no acute distress. She appears pale. Conjunctivae is also pale and sclera is nonicteric. Oropharyngeal mucosa is normal. Neck without masses thyromegaly or lymphadenopathy. Cardiac exam with regular rhythm normal S1 and S2.  She has grade 2 or 6 systolic murmur best heard at aortic area. Auscultation lungs reveal vesicular breath sounds bilaterally. Abdomen is protuberant.  Bowel sounds are normal.  On palpation abdomen is soft and nontender with organomegaly or masses. Rectal examination deferred. She does not have peripheral  edema or clubbing.     Lab Results: Recent Labs    12/22/20 0843 12/22/20 0852  WBC 16.4*  --   HGB 6.3* 6.5*  HCT 21.0* 19.0*  PLT 337  --    BMET Recent Labs    12/22/20 0843 12/22/20 0852  NA 140 143  K 3.8 3.8  CL 112* 112*  CO2 19*  --   GLUCOSE 143* 138*  BUN 59* 60*  CREATININE 1.00 0.90  CALCIUM 8.1*  --    LFT Recent Labs    12/22/20 0843  PROT 6.5  ALBUMIN 3.6  AST 17  ALT 12  ALKPHOS 45  BILITOT 0.4   PT/INR No results for input(s): LABPROT, INR in the last 72 hours. Hepatitis Panel No results for input(s): HEPBSAG, HCVAB, HEPAIGM,  HEPBIGM in the last 72 hours.  Studies/Results: DG Chest Port 1 View  Result Date: 12/22/2020 CLINICAL DATA:  Shortness of breath. Shortness of breath with exertion, black stools, dizziness when standing. EXAM: PORTABLE CHEST 1 VIEW COMPARISON:  Prior chest radiographs 01/02/2010 and earlier. FINDINGS: Cardiomegaly, unchanged. Central pulmonary vascular congestion and mild interstitial edema. Mild opacity within the lateral left lung base. No appreciable consolidation within the right lung. No evidence of pleural effusion or pneumothorax. No acute bony abnormality identified. IMPRESSION: Cardiomegaly. Pulmonary vascular congestion and mild interstitial edema. Mild opacity within the lateral left lung base, favored to reflect atelectasis. Pneumonia is difficult to definitively exclude and clinical correlation is recommended. Electronically Signed   By: Kellie Simmering DO   On: 12/22/2020 09:28    Assessment;  Bethany Cunningham is 70 year old Caucasian female who presents with melena and anemia.  She was hypotensive on admission and responded to IV fluids and now she has been transfused with PRBCs.  Hemoglobin on admission was 6.3 g.  Platelet count is normal.  No history of peptic ulcer disease or NSAID use. Past history significant for elevated transaminases and fatty liver.  Lately her transaminases have been normal including ones from this admission. Differential diagnosis includes peptic ulcer disease GI angiodysplasia occult neoplasm and we also have to worry about esophageal and/or gastric variceal bleed given history of fatty liver for at least 11 years.  Therefore she will be started on octreotide infusion after bolus.  History of fatty liver as above.  If she is confirmed to have cirrhosis she will need further work-up to include markers for hepatitis BC as well as other conditions.  History of multiple sessile serrated polyps and tubular adenomas.  She is due for surveillance colonoscopy in March  2023.   Recommendations;  Check INR. Begin octreotide infusion; 50 mcg/h after a bolus of 50 mcg. Continue pantoprazole infusion. Sips of clear liquids. Abdominal ultrasound with elastography in AM. Esophagogastroduodenoscopy under monitored anesthesia care with therapeutic intention which may include injection coagulation or banding of varices by Dr. Abbey Chatters.   LOS: 0 days   Bethany Cunningham  12/22/2020, 1:17 PM

## 2020-12-23 ENCOUNTER — Inpatient Hospital Stay (HOSPITAL_COMMUNITY): Payer: Medicare PPO

## 2020-12-23 ENCOUNTER — Encounter (HOSPITAL_COMMUNITY)
Admission: EM | Disposition: A | Discharge status: Home / Self Care | Payer: Self-pay | Source: Home / Self Care | Attending: Internal Medicine

## 2020-12-23 DIAGNOSIS — K922 Gastrointestinal hemorrhage, unspecified: Secondary | ICD-10-CM

## 2020-12-23 DIAGNOSIS — D62 Acute posthemorrhagic anemia: Secondary | ICD-10-CM

## 2020-12-23 DIAGNOSIS — R9431 Abnormal electrocardiogram [ECG] [EKG]: Secondary | ICD-10-CM

## 2020-12-23 DIAGNOSIS — R778 Other specified abnormalities of plasma proteins: Secondary | ICD-10-CM

## 2020-12-23 DIAGNOSIS — R0609 Other forms of dyspnea: Secondary | ICD-10-CM

## 2020-12-23 DIAGNOSIS — I5033 Acute on chronic diastolic (congestive) heart failure: Secondary | ICD-10-CM

## 2020-12-23 DIAGNOSIS — K760 Fatty (change of) liver, not elsewhere classified: Secondary | ICD-10-CM | POA: Diagnosis not present

## 2020-12-23 LAB — COMPREHENSIVE METABOLIC PANEL
ALT: 13 U/L (ref 0–44)
AST: 18 U/L (ref 15–41)
Albumin: 3.3 g/dL — ABNORMAL LOW (ref 3.5–5.0)
Alkaline Phosphatase: 47 U/L (ref 38–126)
Anion gap: 6 (ref 5–15)
BUN: 38 mg/dL — ABNORMAL HIGH (ref 8–23)
CO2: 22 mmol/L (ref 22–32)
Calcium: 8 mg/dL — ABNORMAL LOW (ref 8.9–10.3)
Chloride: 113 mmol/L — ABNORMAL HIGH (ref 98–111)
Creatinine, Ser: 1.07 mg/dL — ABNORMAL HIGH (ref 0.44–1.00)
GFR, Estimated: 56 mL/min — ABNORMAL LOW (ref >60)
Glucose, Bld: 143 mg/dL — ABNORMAL HIGH (ref 70–99)
Potassium: 4.2 mmol/L (ref 3.5–5.1)
Sodium: 141 mmol/L (ref 135–145)
Total Bilirubin: 1.1 mg/dL (ref 0.3–1.2)
Total Protein: 6 g/dL — ABNORMAL LOW (ref 6.5–8.1)

## 2020-12-23 LAB — ECHOCARDIOGRAM COMPLETE
AR max vel: 1.03 cm2
AV Area VTI: 2.39 cm2
AV Area mean vel: 2.45 cm2
AV Mean grad: 16.3 mmHg
AV Peak grad: 33.3 mmHg
Ao pk vel: 2.88 m/s
Area-P 1/2: 1.9 cm2
Height: 62 in
MV VTI: 3.48 cm2
S' Lateral: 3.24 cm
Weight: 3615.54 oz

## 2020-12-23 LAB — CBC
HCT: 28.5 % — ABNORMAL LOW (ref 36.0–46.0)
Hemoglobin: 8.1 g/dL — ABNORMAL LOW (ref 12.0–15.0)
MCH: 26.4 pg (ref 26.0–34.0)
MCHC: 28.4 g/dL — ABNORMAL LOW (ref 30.0–36.0)
MCV: 92.8 fL (ref 80.0–100.0)
Platelets: 303 10*3/uL (ref 150–400)
RBC: 3.07 MIL/uL — ABNORMAL LOW (ref 3.87–5.11)
RDW: 16.9 % — ABNORMAL HIGH (ref 11.5–15.5)
WBC: 15.5 10*3/uL — ABNORMAL HIGH (ref 4.0–10.5)
nRBC: 0.3 % — ABNORMAL HIGH (ref 0.0–0.2)

## 2020-12-23 LAB — BLOOD GAS, ARTERIAL
Acid-base deficit: 3.9 mmol/L — ABNORMAL HIGH (ref 0.0–2.0)
Bicarbonate: 21.2 mmol/L (ref 20.0–28.0)
FIO2: 55
O2 Saturation: 98.2 %
Patient temperature: 37
pCO2 arterial: 35.6 mmHg (ref 32.0–48.0)
pH, Arterial: 7.376 (ref 7.350–7.450)
pO2, Arterial: 108 mmHg (ref 83.0–108.0)

## 2020-12-23 LAB — TROPONIN I (HIGH SENSITIVITY)
Troponin I (High Sensitivity): 736 ng/L (ref <18)
Troponin I (High Sensitivity): 741 ng/L (ref <18)

## 2020-12-23 LAB — D-DIMER, QUANTITATIVE: D-Dimer, Quant: 0.35 ug/mL-FEU (ref 0.00–0.50)

## 2020-12-23 LAB — PROCALCITONIN: Procalcitonin: <0.1 ng/mL

## 2020-12-23 LAB — MRSA NEXT GEN BY PCR, NASAL: MRSA by PCR Next Gen: NOT DETECTED

## 2020-12-23 LAB — BRAIN NATRIURETIC PEPTIDE: B Natriuretic Peptide: 393 pg/mL — ABNORMAL HIGH (ref 0.0–100.0)

## 2020-12-23 SURGERY — ESOPHAGOGASTRODUODENOSCOPY (EGD) WITH PROPOFOL
Anesthesia: Monitor Anesthesia Care

## 2020-12-23 MED ORDER — FUROSEMIDE 10 MG/ML IJ SOLN
20.0000 mg | Freq: Two times a day (BID) | INTRAMUSCULAR | Status: DC
  Administered 2020-12-23 – 2020-12-26 (×6): 20 mg via INTRAVENOUS
  Filled 2020-12-23 (×6): qty 2

## 2020-12-23 NOTE — Consult Note (Addendum)
Cardiology Consultation:   Patient ID: Bethany Cunningham MRN: [id number]; DOB: 08-Feb-1951  Admit date: 12/22/2020 Date of Consult: 12/23/2020  PCP:  Celene Squibb, MD   [facility] Cardiologist:  Carlyle Dolly, MD   {     Patient Profile:   Bethany Cunningham is a 70 y.o. female with a hx of chest pain and DOE who is being seen 12/23/2020 for the evaluation of elevated troponins at the request of Dr. Manuella Ghazi.  History of Present Illness:   Bethany Cunningham is a 70 yo female with history of HTN, HLD, morbid obesity, chest pain/DOE-low risk NST 1027, diastolic CHF admitted with black, tarry stools, dizziness, nausea, DOE, hypotensive and Hbg 6.5. transfused 2 untits. CXR vascular congestion, BNP 393, troponins 112,736, 741. EKG yest with ST flattening inf/lat, ST elevation AVR.today, not as prominent.  Patient denies chest pain. Says she has had progressive DOE and had it worse in May when trying to pull a 4 foot weed out of her garden. No recent swelling-takes lasix. Currently breathing is improving.   Past Medical History:  Diagnosis Date   Diverticulosis    Dyslipidemia    HTN (hypertension)    Hypothyroid    Skin disorder     Past Surgical History:  Procedure Laterality Date   ABDOMINAL HYSTERECTOMY     CHOLECYSTECTOMY     COLONOSCOPY N/A 09/28/2018   Procedure: COLONOSCOPY;  Surgeon: Danie Binder, MD;  Location: [facility];  Service: Endoscopy;  Laterality: N/A;  2:00pm   POLYPECTOMY  09/28/2018   Procedure: POLYPECTOMY;  Surgeon: Danie Binder, MD;  Location: [facility];  Service: Endoscopy;;   TONSILLECTOMY       Home Medications:  Prior to Admission medications   Medication Sig Start Date End Date Taking? Authorizing Provider  amLODipine (NORVASC) 10 MG tablet Take 10 mg by mouth daily.   Yes [provider]  atorvastatin (LIPITOR) 20 MG tablet Take 20 mg by mouth daily. 06/03/19  Yes [provider]  furosemide (LASIX) 20 MG  tablet Take 20 mg by mouth daily.   Yes [provider]  levothyroxine (SYNTHROID) 50 MCG tablet Take 1 tablet (50 mcg total) by mouth daily before breakfast. 03/29/20  Yes Philemon Kingdom, MD  losartan (COZAAR) 100 MG tablet TAKE 1 TABLET(100 MG) BY MOUTH DAILY 08/14/20  Yes Branch, Alphonse Guild, MD  Aspirin-Acetaminophen (GOODYS BODY PAIN PO) Take 1 Package by mouth daily as needed (pain/headache).    [provider]  tiZANidine (ZANAFLEX) 2 MG tablet Take 2 mg by mouth every 6 (six) hours as needed for muscle spasms. Patient not taking: No sig reported    [provider]    Inpatient Medications: Scheduled Meds:  Chlorhexidine Gluconate Cloth  6 each Topical Daily   levothyroxine  50 mcg Oral QAC breakfast   sodium chloride flush  3 mL Intravenous Q12H   Continuous Infusions:  sodium chloride     sodium chloride     octreotide  (SANDOSTATIN)    IV infusion 50 mcg/hr (12/23/20 2536)   pantoprazole 8 mg/hr (12/23/20 0822)   PRN Meds: sodium chloride, acetaminophen **OR** acetaminophen, albuterol, fluticasone, ondansetron **OR** ondansetron (ZOFRAN) IV, sodium chloride flush  Allergies:   No Known Allergies  Social History:   Social History   Socioeconomic History   Marital status: Single    Spouse name: Not on file   Number of children: 0   Years of education: Not on file   Highest  education level: Not on file  Occupational History   Occupation: retired Product manager: RETIRED  Tobacco Use   Smoking status: Former    Pack years: 0.00    Types: Cigarettes    Quit date: 07/06/2001    Years since quitting: 19.4   Smokeless tobacco: Never   Tobacco comments:    Former smoker/ quit x 8 years  Vaping Use   Vaping Use: Never used  Substance and Sexual Activity   Alcohol use: Yes    Comment: occas   Drug use: No   Sexual activity: Yes    Birth control/protection: Surgical  Other Topics Concern   Not on file  Social History Narrative   Not  on file   Social Determinants of Health   Financial Resource Strain: Not on file  Food Insecurity: Not on file  Transportation Needs: Not on file  Physical Activity: Not on file  Stress: Not on file  Social Connections: Not on file  Intimate Partner Violence: Not on file    Family History:     Family History  Problem Relation Age of Onset   AAA (abdominal aortic aneurysm) Mother    Colon polyps Mother    Lung cancer Father    Thyroid disease Sister    Colon cancer Neg Hx    Inflammatory bowel disease Neg Hx      ROS:  Please see the history of present illness.  Review of Systems  Constitutional: Negative.  HENT: Negative.    Eyes: Negative.   Cardiovascular:  Positive for dyspnea on exertion.  Respiratory: Negative.    Hematologic/Lymphatic: Negative.   Musculoskeletal: Negative.  Negative for joint pain.  Gastrointestinal:  Positive for melena.  Genitourinary: Negative.   Neurological: Negative.    All other ROS reviewed and negative.     Physical Exam/Data:   Vitals:   12/23/20 0600 12/23/20 0700 12/23/20 0800 12/23/20 1100  BP: (!) 113/50 (!) 113/56 (!) 111/56   Pulse: 90 88 88   Resp: (!) 23 (!) 24 (!) 22   Temp:  98.7 F (37.1 C)  98.8 F (37.1 C)  TempSrc:  Oral  Oral  SpO2: 96% 94% 94%   Weight:      Height:        Intake/Output Summary (Last 24 hours) at 12/23/2020 1224 Last data filed at 12/23/2020 7654 Gross per 24 hour  Intake 1555.89 ml  Output --  Net 1555.89 ml   Last 3 Weights 12/22/2020 12/22/2020 09/17/2020  Weight (lbs) 225 lb 15.5 oz 240 lb 229 lb  Weight (kg) 102.5 kg 108.863 kg 103.874 kg     Body mass index is 41.33 kg/m.  General:  Obese, in no acute distress  HEENT: normal Lymph: no adenopathy Neck: no JVD Endocrine:  No thryomegaly Vascular: No carotid bruits; FA pulses 2+ bilaterally without bruits  Cardiac:  normal S1, S2; RRR; 2/6 Lungs:  clear to auscultation bilaterally, no wheezing, rhonchi or rales  Abd: soft,  nontender, no hepatomegaly  Ext: no edema Musculoskeletal:  No deformities, BUE and BLE strength normal and equal Skin: warm and dry  Neuro:  CNs 2-12 intact, no focal abnormalities noted Psych:  Normal affect   EKG:  The EKG was personally reviewed and demonstrates:   see above Telemetry:  Telemetry was personally reviewed and demonstrates:  NSR  Relevant CV Studies: Echo 01/19/90 Study Conclusions   - Procedure narrative: Transthoracic echocardiography. Image    quality was poor. The study was  technically difficult, as a    result of poor acoustic windows and body habitus.  - Left ventricle: The cavity size was normal. Wall thickness was    increased in a pattern of mild LVH. Systolic function was normal.    The estimated ejection fraction was in the range of 60% to 65%.    Images were inadequate for LV wall motion assessment. Doppler    parameters are consistent with abnormal left ventricular    relaxation (grade 1 diastolic dysfunction). Doppler parameters    are consistent with high ventricular filling pressure.  - Aortic valve: There was mild regurgitation.  - Mitral valve: Mildly calcified annulus. Normal thickness leaflets    .   Laboratory Data:  High Sensitivity Troponin:   Recent Labs  Lab 12/22/20 0843 12/22/20 1451 12/23/20 0811 12/23/20 1007  TROPONINIHS 36* 112* 736* 741*     Chemistry Recent Labs  Lab 12/22/20 0843 12/22/20 0852 12/23/20 0438  NA 140 143 141  K 3.8 3.8 4.2  CL 112* 112* 113*  CO2 19*  --  22  GLUCOSE 143* 138* 143*  BUN 59* 60* 38*  CREATININE 1.00 0.90 1.07*  CALCIUM 8.1*  --  8.0*  GFRNONAA >60  --  56*  ANIONGAP 9  --  6    Recent Labs  Lab 12/22/20 0843 12/23/20 0438  PROT 6.5 6.0*  ALBUMIN 3.6 3.3*  AST 17 18  ALT 12 13  ALKPHOS 45 47  BILITOT 0.4 1.1   Hematology Recent Labs  Lab 12/22/20 0843 12/22/20 0852 12/23/20 0438  WBC 16.4*  --  15.5*  RBC 2.43*  --  3.07*  HGB 6.3* 6.5* 8.1*  HCT 21.0* 19.0*  28.5*  MCV 86.4  --  92.8  MCH 25.9*  --  26.4  MCHC 30.0  --  28.4*  RDW 15.6*  --  16.9*  PLT 337  --  303   BNP Recent Labs  Lab 12/22/20 0843 12/23/20 0439  BNP 133.0* 393.0*    DDimer  Recent Labs  Lab 12/23/20 0850  DDIMER 0.35     Radiology/Studies:  DG Chest Port 1 View  Result Date: 12/22/2020 CLINICAL DATA:  Shortness of breath. Shortness of breath with exertion, black stools, dizziness when standing. EXAM: PORTABLE CHEST 1 VIEW COMPARISON:  Prior chest radiographs 01/02/2010 and earlier. FINDINGS: Cardiomegaly, unchanged. Central pulmonary vascular congestion and mild interstitial edema. Mild opacity within the lateral left lung base. No appreciable consolidation within the right lung. No evidence of pleural effusion or pneumothorax. No acute bony abnormality identified. IMPRESSION: Cardiomegaly. Pulmonary vascular congestion and mild interstitial edema. Mild opacity within the lateral left lung base, favored to reflect atelectasis. Pneumonia is difficult to definitively exclude and clinical correlation is recommended. Electronically Signed   By: Kellie Simmering DO   On: 12/22/2020 09:28   DG Chest Port 1V same Day  Result Date: 12/22/2020 CLINICAL DATA:  Short of breath hypotension EXAM: PORTABLE CHEST 1 VIEW COMPARISON:  12/22/2020 FINDINGS: Heart size upper normal. Negative for heart failure. Lungs remain clear without infiltrate or effusion. IMPRESSION: No active disease. Electronically Signed   By: Franchot Gallo M.D.   On: 12/22/2020 16:25   US ABDOMEN COMPLETE W/ELASTOGRAPHY  Result Date: 12/23/2020 CLINICAL DATA:  Nonalcoholic fatty liver EXAM: ULTRASOUND ABDOMEN ULTRASOUND HEPATIC ELASTOGRAPHY TECHNIQUE: Sonography of the upper abdomen was performed. In addition, ultrasound elastography evaluation of the liver was performed. A region of interest was placed within the right lobe of the liver.  Following application of a compressive sonographic pulse, tissue  compressibility was assessed. Multiple assessments were performed at the selected site. Median tissue compressibility was determined. Previously, hepatic stiffness was assessed by shear wave velocity. Based on recently published Society of Radiologists in Ultrasound consensus article, reporting is now recommended to be performed in the SI units of pressure (kiloPascals) representing hepatic stiffness/elasticity. The obtained result is compared to the published reference standards. (cACLD = compensated Advanced Chronic Liver Disease) COMPARISON:  None. FINDINGS: ULTRASOUND ABDOMEN Gallbladder: Status post cholecystectomy. Common bile duct: Diameter: 5 mm Liver: No focal lesion identified. Increased parenchymal echogenicity. Portal vein is patent on color Doppler imaging with normal direction of blood flow towards the liver. IVC: No abnormality visualized. Pancreas: Obscured by overlying bowel gas. Spleen: Size and appearance within normal limits. Right Kidney: Length: 15.0 cm. Enlarged by multiple cysts, some of which are thinly septated. No solid mass or hydronephrosis visualized. Left Kidney: Length: 11.8 cm. Echogenicity within normal limits. No mass or hydronephrosis visualized. Abdominal aorta: No aneurysm visualized. Other findings: None. ULTRASOUND HEPATIC ELASTOGRAPHY Device: Siemens Helix VTQ Patient position: Oblique Transducer 5C1 Number of measurements: 15 Hepatic segment:  8 Median kPa: 2.1 IQR: 0.1 IQR/Median kPa ratio: 0.05 Data quality:  Good Diagnostic category:  < or = 5 kPa: high probability of being normal The use of hepatic elastography is applicable to patients with viral hepatitis and non-alcoholic fatty liver disease. At this time, there is insufficient data for the referenced cut-off values and use in other causes of liver disease, including alcoholic liver disease. Patients, however, may be assessed by elastography and serve as their own reference standard/baseline. In patients with  non-alcoholic liver disease, the values suggesting compensated advanced chronic liver disease (cACLD) may be lower, and patients may need additional testing with elasticity results of 7-9 kPa. Please note that abnormal hepatic elasticity and shear wave velocities may also be identified in clinical settings other than with hepatic fibrosis, such as: acute hepatitis, elevated right heart and central venous pressures including use of beta blockers, veno-occlusive disease (Budd-Chiari), infiltrative processes such as mastocytosis/amyloidosis/infiltrative tumor/lymphoma, extrahepatic cholestasis, with hyperemia in the post-prandial state, and with liver transplantation. Correlation with patient history, laboratory data, and clinical condition recommended. Diagnostic Categories: < or =5 kPa: high probability of being normal < or =9 kPa: in the absence of other known clinical signs, rules out cACLD >9 kPa and ?13 kPa: suggestive of cACLD, but needs further testing >13 kPa: highly suggestive of cACLD > or =17 kPa: highly suggestive of cACLD with an increased probability of clinically significant portal hypertension IMPRESSION: ULTRASOUND ABDOMEN: 1.  Hepatic steatosis. 2.  Status post cholecystectomy. ULTRASOUND HEPATIC ELASTOGRAPHY: Median kPa:  2.1 Diagnostic category:  < or = 5 kPa: high probability of being normal Electronically Signed   By: Eddie Candle M.D.   On: 12/23/2020 11:35     Assessment and Plan:   Elevated troponins/abnormal EKG in the setting of acute GI bleed-no chest pain, just DOE which is improving with IV lasix. And transfusion. Await echo. May be demand ischemia in the setting of GI bleed. Can have endo  GI bleed managed by GI  History of chest pressure/DOE negative w/u 2019 with low risk NST  Acute on Chronic diastolic CHF BNP 742 on admission, 89 yest-received lasix 20 mg IV x 1- I/O's positive 2.6 L   HTN well controlled   Risk Assessment/Risk Scores:     TIMI Risk Score for  Unstable Angina or Non-ST Elevation MI:  The patient's TIMI risk score is 4, which indicates a 20% risk of all cause mortality, new or recurrent myocardial infarction or need for urgent revascularization in the next 14 days.  New York Heart Association (NYHA) Functional Class NYHA Class III        For questions or updates, please contact [facility] Please consult [email or web address] for contact info under    Signed, Ermalinda Barrios, PA-C  12/23/2020 12:24 PM   Patient seen and examined   I agree with findings as noted by Gerrianne Scale above   Pt is a 70 yo with Hx of HTN, remote chest pressure (normal sterss test in 2019) who presents with severe SOB   Found to be severely aneimc   Trnasfused   Breathing is improved    Currently pt on FM   Denies CP  Lungs are relatively clear   Cardiac RRR  No S3 Gr II/VI systolic murmur LSB Abd is benign  No hepatomegaly Ext are without edema     I have reviewed echo  LV with severe proximal thickening  Function is hyperdynamic  With narrow LVOT there is a severe gradient through the LV/LVOT consistent with HOCM physiology      EKG with  ST depression in inferior and lateral leads.  Present in previous EKGs but more prominent    Trop peak at 741   No demonstration of decline yet     Impression:   Elevated Troponin / NSTEMI Pt asymptomatic (no CP)_ Troponin elevation most likely refects demand ischemia in setting of profound anemia     I would continue to follow trop trends I would transfuse to keep Hgb greater tan 7.5/8     Hydrate   WOuld recomm limited echo prior to d/c to  see if hemodynamics (gradients ) improve   From a cardiac standpoint I think pt is at acceptable risk for colonoscopy and OK to proceed.     Dorris Carnes MD

## 2020-12-23 NOTE — Progress Notes (Signed)
RT called due to pt desat in 80's on 4L [place], pt tried on HFNC 10L but pt is mouth breathing. Pt placed on 55% VM spo2 94-97%. Pt repositioned and sat up in bed with improved oxygenation however pt states she " isn't comfortable". Spoke with pt about trying BiPAP but pt does not want to try any other type of mask. RR 18-26

## 2020-12-23 NOTE — Progress Notes (Signed)
PROGRESS NOTE    Bethany Cunningham  DVV:[id number] DOB: 1951-01-01 DOA: 12/22/2020 PCP: Celene Squibb, MD   Brief Narrative:   Bethany Cunningham is a 70 y.o. female with medical history significant for morbid obesity, hypertension, dyslipidemia, and hypothyroidism who presented to the ED with complaints of black, tarry stools that began on 6/18.  She was admitted with acute blood loss anemia suspected secondary to upper GI bleed and had been started on PPI infusion as well as octreotide.  She has been transfused 2 units of PRBCs with stability overnight.  Troponins had elevated and she was noted to be more hypoxemic and therefore cardiology was consulted for evaluation.  This appears to be related to demand ischemia in the setting of her severe anemia.  She is noted to be mildly volume overloaded and Lasix has been initiated for diuresis.  Plans are for endoscopy on 6/21.  Assessment & Plan:   Active Problems:   Acute blood loss anemia   Acute blood loss anemia suspected secondary to upper GI bleed-stable -Status post 2 unit PRBCs -Clear liquid diet and n.p.o. after midnight -GI consult with plans for upper endoscopy in a.m. -Remote use of Goody powders -Monitor repeat H/H -Protonix infusion and octreotide infusion   Acute hypoxemic respiratory failure with dyspnea on exertion/near syncope -Appears to have some signs of volume overload on chest x-ray and with noted elevated BNP -IV Lasix 20 mg twice daily ordered -2D echocardiogram pending  Troponin elevation -Appears to be demand ischemia, appreciate cardiology evaluation -2D echocardiogram pending   History of hypertension -Currently hypotensive, hold home medications   Dyslipidemia -Hold statin and fish oils   History of hypothyroidism -Continue levothyroxine   Morbid obesity -Lifestyle changes outpatient   DVT prophylaxis:SCDs Code Status: Full Family Communication:None at bedside  Disposition Plan:  Status is:  Inpatient  Remains inpatient appropriate because:Ongoing diagnostic testing needed not appropriate for outpatient work up, IV treatments appropriate due to intensity of illness or inability to take PO, and Inpatient level of care appropriate due to severity of illness  Dispo: The patient is from: Home              Anticipated d/c is to: Home              Patient currently is not medically stable to d/c.   Difficult to place patient No   Consultants:  GI Cardio  Procedures:  See below  Antimicrobials:  None   Subjective: Patient seen and evaluated today with no new acute complaints or concerns.  She has had increased oxygen requirements overnight.  She denies any chest pain.  Objective: Vitals:   12/23/20 0600 12/23/20 0700 12/23/20 0800 12/23/20 1100  BP: (!) 113/50 (!) 113/56 (!) 111/56   Pulse: 90 88 88   Resp: (!) 23 (!) 24 (!) 22   Temp:  98.7 F (37.1 C)  98.8 F (37.1 C)  TempSrc:  Oral  Oral  SpO2: 96% 94% 94%   Weight:      Height:        Intake/Output Summary (Last 24 hours) at 12/23/2020 1634 Last data filed at 12/23/2020 1350 Gross per 24 hour  Intake 1037.01 ml  Output --  Net 1037.01 ml   Filed Weights   12/22/20 0822 12/22/20 1601  Weight: 108.9 kg 102.5 kg    Examination:  General exam: Appears calm and comfortable, obese Respiratory system: Clear to auscultation. Respiratory effort normal.  On 15 L nasal cannula oxygen.  Cardiovascular system: S1 & S2 heard, RRR.  Gastrointestinal system: Abdomen is soft Central nervous system: Alert and awake Extremities: No edema Skin: No significant lesions noted Psychiatry: Flat affect.    Data Reviewed: I have personally reviewed following labs and imaging studies  CBC: Recent Labs  Lab 12/22/20 0843 12/22/20 0852 12/23/20 0438  WBC 16.4*  --  15.5*  NEUTROABS 13.8*  --   --   HGB 6.3* 6.5* 8.1*  HCT 21.0* 19.0* 28.5*  MCV 86.4  --  92.8  PLT 337  --  443   Basic Metabolic Panel: Recent  Labs  Lab 12/22/20 0843 12/22/20 0852 12/22/20 1451 12/23/20 0438  NA 140 143  --  141  K 3.8 3.8  --  4.2  CL 112* 112*  --  113*  CO2 19*  --   --  22  GLUCOSE 143* 138*  --  143*  BUN 59* 60*  --  38*  CREATININE 1.00 0.90  --  1.07*  CALCIUM 8.1*  --   --  8.0*  MG  --   --  2.2  --    GFR: Estimated Creatinine Clearance: 54.1 mL/min (A) (by C-G formula based on SCr of 1.07 mg/dL (H)). Liver Function Tests: Recent Labs  Lab 12/22/20 0843 12/23/20 0438  AST 17 18  ALT 12 13  ALKPHOS 45 47  BILITOT 0.4 1.1  PROT 6.5 6.0*  ALBUMIN 3.6 3.3*   No results for input(s): LIPASE, AMYLASE in the last 168 hours. No results for input(s): AMMONIA in the last 168 hours. Coagulation Profile: Recent Labs  Lab 12/22/20 1451  INR 1.1   Cardiac Enzymes: No results for input(s): CKTOTAL, CKMB, CKMBINDEX, TROPONINI in the last 168 hours. BNP (last 3 results) No results for input(s): PROBNP in the last 8760 hours. HbA1C: No results for input(s): HGBA1C in the last 72 hours. CBG: No results for input(s): GLUCAP in the last 168 hours. Lipid Profile: No results for input(s): CHOL, HDL, LDLCALC, TRIG, CHOLHDL, LDLDIRECT in the last 72 hours. Thyroid Function Tests: No results for input(s): TSH, T4TOTAL, FREET4, T3FREE, THYROIDAB in the last 72 hours. Anemia Panel: No results for input(s): VITAMINB12, FOLATE, FERRITIN, TIBC, IRON, RETICCTPCT in the last 72 hours. Sepsis Labs: Recent Labs  Lab 12/23/20 0850  PROCALCITON <0.10    Recent Results (from the past 240 hour(s))  Resp Panel by RT-PCR (Flu A&B, Covid) Nasopharyngeal Swab     Status: None   Collection Time: 12/22/20  9:15 AM   Specimen: Nasopharyngeal Swab; Nasopharyngeal(NP) swabs in vial transport medium  Result Value Ref Range Status   SARS Coronavirus 2 by RT PCR NEGATIVE NEGATIVE Final    Comment: (NOTE) SARS-CoV-2 target nucleic acids are NOT DETECTED.  The SARS-CoV-2 RNA is generally detectable in upper  respiratory specimens during the acute phase of infection. The lowest concentration of SARS-CoV-2 viral copies this assay can detect is 138 copies/mL. A negative result does not preclude SARS-Cov-2 infection and should not be used as the sole basis for treatment or other patient management decisions. A negative result may occur with  improper specimen collection/handling, submission of specimen other than nasopharyngeal swab, presence of viral mutation(s) within the areas targeted by this assay, and inadequate number of viral copies(<138 copies/mL). A negative result must be combined with clinical observations, patient history, and epidemiological information. The expected result is Negative.  Fact Sheet for Patients:  EntrepreneurPulse.com.au  Fact Sheet for Healthcare Providers:  IncredibleEmployment.be  This test is  no t yet approved or cleared by the [place] and  has been authorized for detection and/or diagnosis of SARS-CoV-2 by FDA under an Emergency Use Authorization (EUA). This EUA will remain  in effect (meaning this test can be used) for the duration of the COVID-19 declaration under Section 564(b)(1) of the Act, 21 U.S.C.section 360bbb-3(b)(1), unless the authorization is terminated  or revoked sooner.       Influenza A by PCR NEGATIVE NEGATIVE Final   Influenza B by PCR NEGATIVE NEGATIVE Final    Comment: (NOTE) The Xpert Xpress SARS-CoV-2/FLU/RSV plus assay is intended as an aid in the diagnosis of influenza from Nasopharyngeal swab specimens and should not be used as a sole basis for treatment. Nasal washings and aspirates are unacceptable for Xpert Xpress SARS-CoV-2/FLU/RSV testing.  Fact Sheet for Patients: EntrepreneurPulse.com.au  Fact Sheet for Healthcare Providers: IncredibleEmployment.be  This test is not yet approved or cleared by the [place] FDA and has been  authorized for detection and/or diagnosis of SARS-CoV-2 by FDA under an Emergency Use Authorization (EUA). This EUA will remain in effect (meaning this test can be used) for the duration of the COVID-19 declaration under Section 564(b)(1) of the Act, 21 U.S.C. section 360bbb-3(b)(1), unless the authorization is terminated or revoked.  Performed at [facility], [address], [place], [place] [address]   MRSA Next Gen by PCR, Nasal     Status: None   Collection Time: 12/22/20  5:00 PM   Specimen: Nasal Mucosa; Nasal Swab  Result Value Ref Range Status   MRSA by PCR Next Gen NOT DETECTED NOT DETECTED Final    Comment: (NOTE) The GeneXpert MRSA Assay (FDA approved for NASAL specimens only), is one component of a comprehensive MRSA colonization surveillance program. It is not intended to diagnose MRSA infection nor to guide or monitor treatment for MRSA infections. Test performance is not FDA approved in patients less than 46 years old. Performed at [facility], [address], [place], [place] [address]          Radiology Studies: DG Chest 1 View  Result Date: 12/23/2020 CLINICAL DATA:  Respiratory failure EXAM: CHEST  1 VIEW COMPARISON:  Film from the previous day. FINDINGS: Cardiac shadow is stable. Lungs are well aerated bilaterally. Mild interstitial edema is noted. No sizable effusion is noted. No bony abnormality is seen. IMPRESSION: New onset mild interstitial edema. Electronically Signed   By: Inez Catalina M.D.   On: 12/23/2020 15:01   DG Chest Port 1 View  Result Date: 12/22/2020 CLINICAL DATA:  Shortness of breath. Shortness of breath with exertion, black stools, dizziness when standing. EXAM: PORTABLE CHEST 1 VIEW COMPARISON:  Prior chest radiographs 01/02/2010 and earlier. FINDINGS: Cardiomegaly, unchanged. Central pulmonary vascular congestion and mild interstitial edema. Mild opacity within the lateral left lung base. No appreciable consolidation within the right lung.  No evidence of pleural effusion or pneumothorax. No acute bony abnormality identified. IMPRESSION: Cardiomegaly. Pulmonary vascular congestion and mild interstitial edema. Mild opacity within the lateral left lung base, favored to reflect atelectasis. Pneumonia is difficult to definitively exclude and clinical correlation is recommended. Electronically Signed   By: Kellie Simmering DO   On: 12/22/2020 09:28   DG Chest Port 1V same Day  Result Date: 12/22/2020 CLINICAL DATA:  Short of breath hypotension EXAM: PORTABLE CHEST 1 VIEW COMPARISON:  12/22/2020 FINDINGS: Heart size upper normal. Negative for heart failure. Lungs remain clear without infiltrate or effusion. IMPRESSION: No active disease. Electronically Signed  By: Franchot Gallo M.D.   On: 12/22/2020 16:25   US ABDOMEN COMPLETE W/ELASTOGRAPHY  Result Date: 12/23/2020 CLINICAL DATA:  Nonalcoholic fatty liver EXAM: ULTRASOUND ABDOMEN ULTRASOUND HEPATIC ELASTOGRAPHY TECHNIQUE: Sonography of the upper abdomen was performed. In addition, ultrasound elastography evaluation of the liver was performed. A region of interest was placed within the right lobe of the liver. Following application of a compressive sonographic pulse, tissue compressibility was assessed. Multiple assessments were performed at the selected site. Median tissue compressibility was determined. Previously, hepatic stiffness was assessed by shear wave velocity. Based on recently published Society of Radiologists in Ultrasound consensus article, reporting is now recommended to be performed in the SI units of pressure (kiloPascals) representing hepatic stiffness/elasticity. The obtained result is compared to the published reference standards. (cACLD = compensated Advanced Chronic Liver Disease) COMPARISON:  None. FINDINGS: ULTRASOUND ABDOMEN Gallbladder: Status post cholecystectomy. Common bile duct: Diameter: 5 mm Liver: No focal lesion identified. Increased parenchymal echogenicity. Portal  vein is patent on color Doppler imaging with normal direction of blood flow towards the liver. IVC: No abnormality visualized. Pancreas: Obscured by overlying bowel gas. Spleen: Size and appearance within normal limits. Right Kidney: Length: 15.0 cm. Enlarged by multiple cysts, some of which are thinly septated. No solid mass or hydronephrosis visualized. Left Kidney: Length: 11.8 cm. Echogenicity within normal limits. No mass or hydronephrosis visualized. Abdominal aorta: No aneurysm visualized. Other findings: None. ULTRASOUND HEPATIC ELASTOGRAPHY Device: Siemens Helix VTQ Patient position: Oblique Transducer 5C1 Number of measurements: 15 Hepatic segment:  8 Median kPa: 2.1 IQR: 0.1 IQR/Median kPa ratio: 0.05 Data quality:  Good Diagnostic category:  < or = 5 kPa: high probability of being normal The use of hepatic elastography is applicable to patients with viral hepatitis and non-alcoholic fatty liver disease. At this time, there is insufficient data for the referenced cut-off values and use in other causes of liver disease, including alcoholic liver disease. Patients, however, may be assessed by elastography and serve as their own reference standard/baseline. In patients with non-alcoholic liver disease, the values suggesting compensated advanced chronic liver disease (cACLD) may be lower, and patients may need additional testing with elasticity results of 7-9 kPa. Please note that abnormal hepatic elasticity and shear wave velocities may also be identified in clinical settings other than with hepatic fibrosis, such as: acute hepatitis, elevated right heart and central venous pressures including use of beta blockers, veno-occlusive disease (Budd-Chiari), infiltrative processes such as mastocytosis/amyloidosis/infiltrative tumor/lymphoma, extrahepatic cholestasis, with hyperemia in the post-prandial state, and with liver transplantation. Correlation with patient history, laboratory data, and clinical condition  recommended. Diagnostic Categories: < or =5 kPa: high probability of being normal < or =9 kPa: in the absence of other known clinical signs, rules out cACLD >9 kPa and ?13 kPa: suggestive of cACLD, but needs further testing >13 kPa: highly suggestive of cACLD > or =17 kPa: highly suggestive of cACLD with an increased probability of clinically significant portal hypertension IMPRESSION: ULTRASOUND ABDOMEN: 1.  Hepatic steatosis. 2.  Status post cholecystectomy. ULTRASOUND HEPATIC ELASTOGRAPHY: Median kPa:  2.1 Diagnostic category:  < or = 5 kPa: high probability of being normal Electronically Signed   By: Eddie Candle M.D.   On: 12/23/2020 11:35        Scheduled Meds:  Chlorhexidine Gluconate Cloth  6 each Topical Daily   furosemide  20 mg Intravenous Q12H   levothyroxine  50 mcg Oral QAC breakfast   sodium chloride flush  3 mL Intravenous Q12H   Continuous Infusions:  sodium chloride     sodium chloride     octreotide  (SANDOSTATIN)    IV infusion 50 mcg/hr (12/23/20 1400)   pantoprazole 8 mg/hr (12/23/20 1401)     LOS: 1 day    Time spent: 35 minutes    Alitza Cowman D Manuella Ghazi, DO [facility]  If 7PM-7AM, please contact night-coverage [email or web address] 12/23/2020, 4:34 PM

## 2020-12-23 NOTE — Progress Notes (Addendum)
Subjective: States she had shortness of breath overnight when on nasal cannula. Noted dyspnea with walking around room. Reviewed RT notes. Patient mouth breathing. When placed on face mask oxygenation improved. Denies any known history of sleep apnea. No shortness of breath currently. Talking to me with mask off and O2 sats 94% on room air. No overt GI bleeding today. No abdominal pain. No chest pain currently.  Objective: Vital signs in last 24 hours: Temp:  [97.7 F (36.5 C)-98.8 F (37.1 C)] 98.7 F (37.1 C) (06/20 0700) Pulse Rate:  [85-108] 88 (06/20 0800) Resp:  [18-32] 22 (06/20 0800) BP: (85-132)/(27-111) 111/56 (06/20 0800) SpO2:  [82 %-100 %] 94 % (06/20 0800) FiO2 (%):  [32 %-55 %] 55 % (06/20 0458) Weight:  [102.5 kg] 102.5 kg (06/19 1601) Last BM Date: 12/21/20 General:   Alert and oriented, pleasant Head:  Normocephalic and atraumatic. Eyes:  No icterus, sclera clear. Conjuctiva pink.  Abdomen:  Bowel sounds present, soft, non-tender, non-distended. Obese Msk:  Symmetrical without gross deformities. Normal posture. Extremities:  Without edema. Neurologic:  Alert and  oriented x4 Psych:  Alert and cooperative. Normal mood and affect.  Intake/Output from previous day: 06/19 0701 - 06/20 0700 In: 2679 [I.V.:464.8; Blood:1214.3; IV Piggyback:1000] Out: -  Intake/Output this shift: Total I/O In: 180.9 [I.V.:180.9] Out: -   Lab Results: Recent Labs    12/22/20 0843 12/22/20 0852 12/23/20 0438  WBC 16.4*  --  15.5*  HGB 6.3* 6.5* 8.1*  HCT 21.0* 19.0* 28.5*  PLT 337  --  303   BMET Recent Labs    12/22/20 0843 12/22/20 0852 12/23/20 0438  NA 140 143 141  K 3.8 3.8 4.2  CL 112* 112* 113*  CO2 19*  --  22  GLUCOSE 143* 138* 143*  BUN 59* 60* 38*  CREATININE 1.00 0.90 1.07*  CALCIUM 8.1*  --  8.0*   LFT Recent Labs    12/22/20 0843 12/23/20 0438  PROT 6.5 6.0*  ALBUMIN 3.6 3.3*  AST 17 18  ALT 12 13  ALKPHOS 45 47  BILITOT 0.4 1.1    PT/INR Recent Labs    12/22/20 1451  LABPROT 13.9  INR 1.1     Studies/Results: DG Chest Port 1 View  Result Date: 12/22/2020 CLINICAL DATA:  Shortness of breath. Shortness of breath with exertion, black stools, dizziness when standing. EXAM: PORTABLE CHEST 1 VIEW COMPARISON:  Prior chest radiographs 01/02/2010 and earlier. FINDINGS: Cardiomegaly, unchanged. Central pulmonary vascular congestion and mild interstitial edema. Mild opacity within the lateral left lung base. No appreciable consolidation within the right lung. No evidence of pleural effusion or pneumothorax. No acute bony abnormality identified. IMPRESSION: Cardiomegaly. Pulmonary vascular congestion and mild interstitial edema. Mild opacity within the lateral left lung base, favored to reflect atelectasis. Pneumonia is difficult to definitively exclude and clinical correlation is recommended. Electronically Signed   By: Kellie Simmering DO   On: 12/22/2020 09:28   DG Chest Port 1V same Day  Result Date: 12/22/2020 CLINICAL DATA:  Short of breath hypotension EXAM: PORTABLE CHEST 1 VIEW COMPARISON:  12/22/2020 FINDINGS: Heart size upper normal. Negative for heart failure. Lungs remain clear without infiltrate or effusion. IMPRESSION: No active disease. Electronically Signed   By: Franchot Gallo M.D.   On: 12/22/2020 16:25    Assessment: 70 year old female with history of fatty liver, presenting with acute blood loss anemia, hypotension, melena, Hgb 6.3 on admission and improving to 8.1 following 2 units PRBCs. No recent  Hgb to compare for baseline.   Acute blood loss anemia: felt to be UGI source with differentials including PUD, angiodysplasias, no documented cirrhosis but it was elected on admission to start on octreotide due to history of fatty liver and concern for possible variceal etiology. No melena this morning.   Fatty liver: platelets normal. Last imaging 2014 with severe fatty liver. [place] with elastography ordered over  weekend and will be done this morning. Octreotide was started empirically per GI over weekend. Antibiotics have not been started empirically.   O2 desaturation overnight: occurring while on 4 liters nasal cannula. Worsening DOE while ambulating yesterday evening. Placed on face mask with improvement. CXR yesterday clear without active disease. She is not currently in distress this morning while talking to her with face mask off and O2 sats 94%. Repeat CXR has been ordered for today. Notably, troponin elevated at 736 this morning. D-dimer in process. ABG requested but not complete. Cardiology consultation has been requested.   Will need EGD this admission but currently on hold until cardiology has evaluated.     Plan: Remain NPO for now Cardiology consultation pending Transfuse prn [place] today Continue on PPI infusion Continue on octreotide infusion Empiric antibiotics for GI Bleed in cirrhosis has not been started as unknown if actual cirrhosis Will need EGD this admission but awaiting cardiology input Colonoscopy 2023 as outpatient due to history of multiple polyps in 2020 Will continue to follow with you   Annitta Needs, PhD, ANP-BC [facility]    LOS: 1 day    12/23/2020, 9:42 AM

## 2020-12-24 ENCOUNTER — Other Ambulatory Visit: Payer: Self-pay

## 2020-12-24 ENCOUNTER — Inpatient Hospital Stay (HOSPITAL_COMMUNITY): Payer: Medicare PPO | Admitting: Anesthesiology

## 2020-12-24 ENCOUNTER — Encounter (HOSPITAL_COMMUNITY)
Admission: EM | Disposition: A | Discharge status: Home / Self Care | Payer: Self-pay | Source: Home / Self Care | Attending: Internal Medicine

## 2020-12-24 ENCOUNTER — Encounter (HOSPITAL_COMMUNITY): Payer: Self-pay | Admitting: Internal Medicine

## 2020-12-24 DIAGNOSIS — K3189 Other diseases of stomach and duodenum: Secondary | ICD-10-CM

## 2020-12-24 DIAGNOSIS — R778 Other specified abnormalities of plasma proteins: Secondary | ICD-10-CM | POA: Diagnosis not present

## 2020-12-24 HISTORY — PX: ESOPHAGOGASTRODUODENOSCOPY (EGD) WITH PROPOFOL: SHX5813

## 2020-12-24 HISTORY — PX: BIOPSY: SHX5522

## 2020-12-24 HISTORY — PX: GIVENS CAPSULE STUDY: SHX5432

## 2020-12-24 LAB — HEMOGLOBIN AND HEMATOCRIT, BLOOD
HCT: 26.3 % — ABNORMAL LOW (ref 36.0–46.0)
Hemoglobin: 7.9 g/dL — ABNORMAL LOW (ref 12.0–15.0)

## 2020-12-24 LAB — BASIC METABOLIC PANEL
Anion gap: 5 (ref 5–15)
BUN: 29 mg/dL — ABNORMAL HIGH (ref 8–23)
CO2: 23 mmol/L (ref 22–32)
Calcium: 8 mg/dL — ABNORMAL LOW (ref 8.9–10.3)
Chloride: 114 mmol/L — ABNORMAL HIGH (ref 98–111)
Creatinine, Ser: 0.93 mg/dL (ref 0.44–1.00)
GFR, Estimated: >60 mL/min (ref >60)
Glucose, Bld: 133 mg/dL — ABNORMAL HIGH (ref 70–99)
Potassium: 4 mmol/L (ref 3.5–5.1)
Sodium: 142 mmol/L (ref 135–145)

## 2020-12-24 LAB — CBC
HCT: 24.2 % — ABNORMAL LOW (ref 36.0–46.0)
Hemoglobin: 7.2 g/dL — ABNORMAL LOW (ref 12.0–15.0)
MCH: 26.8 pg (ref 26.0–34.0)
MCHC: 29.8 g/dL — ABNORMAL LOW (ref 30.0–36.0)
MCV: 90 fL (ref 80.0–100.0)
Platelets: 286 10*3/uL (ref 150–400)
RBC: 2.69 MIL/uL — ABNORMAL LOW (ref 3.87–5.11)
RDW: 17.6 % — ABNORMAL HIGH (ref 11.5–15.5)
WBC: 13.5 10*3/uL — ABNORMAL HIGH (ref 4.0–10.5)
nRBC: 0 % (ref 0.0–0.2)

## 2020-12-24 LAB — PREPARE RBC (CROSSMATCH)

## 2020-12-24 LAB — MAGNESIUM: Magnesium: 2.3 mg/dL (ref 1.7–2.4)

## 2020-12-24 LAB — TROPONIN I (HIGH SENSITIVITY)
Troponin I (High Sensitivity): 447 ng/L (ref <18)
Troponin I (High Sensitivity): 458 ng/L (ref <18)

## 2020-12-24 SURGERY — ESOPHAGOGASTRODUODENOSCOPY (EGD) WITH PROPOFOL
Anesthesia: General

## 2020-12-24 MED ORDER — LIDOCAINE VISCOUS HCL 2 % MT SOLN
OROMUCOSAL | Status: AC
  Administered 2020-12-24: 15 mL
  Filled 2020-12-24: qty 15

## 2020-12-24 MED ORDER — DEXAMETHASONE SODIUM PHOSPHATE 4 MG/ML IJ SOLN
INTRAMUSCULAR | Status: AC
  Filled 2020-12-24: qty 1

## 2020-12-24 MED ORDER — PROPOFOL 10 MG/ML IV BOLUS
INTRAVENOUS | Status: DC | PRN
  Administered 2020-12-24: 50 mg via INTRAVENOUS
  Administered 2020-12-24: 20 mg via INTRAVENOUS
  Administered 2020-12-24: 10 mg via INTRAVENOUS

## 2020-12-24 MED ORDER — PROPOFOL 10 MG/ML IV BOLUS
INTRAVENOUS | Status: AC
  Filled 2020-12-24: qty 20

## 2020-12-24 MED ORDER — LACTATED RINGERS IV SOLN: INTRAVENOUS | Status: DC

## 2020-12-24 MED ORDER — SODIUM CHLORIDE 0.9 % IV SOLN: INTRAVENOUS | Status: DC

## 2020-12-24 MED ORDER — PHENYLEPHRINE 40 MCG/ML (10ML) SYRINGE FOR IV PUSH (FOR BLOOD PRESSURE SUPPORT)
PREFILLED_SYRINGE | INTRAVENOUS | Status: DC | PRN
Start: 2020-12-24 — End: 2020-12-24
  Administered 2020-12-24: 80 ug via INTRAVENOUS
  Administered 2020-12-24 (×2): 120 ug via INTRAVENOUS
  Administered 2020-12-24: 80 ug via INTRAVENOUS

## 2020-12-24 MED ORDER — METOPROLOL TARTRATE 25 MG PO TABS
12.5000 mg | ORAL_TABLET | Freq: Two times a day (BID) | ORAL | Status: DC
  Administered 2020-12-24 – 2020-12-26 (×5): 12.5 mg via ORAL
  Filled 2020-12-24 (×5): qty 1

## 2020-12-24 MED ORDER — SODIUM CHLORIDE 0.9% IV SOLUTION: Freq: Once | INTRAVENOUS | Status: DC

## 2020-12-24 MED ORDER — ETOMIDATE 2 MG/ML IV SOLN
INTRAVENOUS | Status: DC | PRN
  Administered 2020-12-24: 10 mg via INTRAVENOUS
  Administered 2020-12-24: 2 mg via INTRAVENOUS
  Administered 2020-12-24: 6 mg via INTRAVENOUS

## 2020-12-24 MED ORDER — LIDOCAINE VISCOUS HCL 2 % MT SOLN
15.0000 mL | Freq: Once | OROMUCOSAL | Status: AC
  Administered 2020-12-24: 15 mL via OROMUCOSAL

## 2020-12-24 MED ORDER — ETOMIDATE 2 MG/ML IV SOLN
INTRAVENOUS | Status: AC
  Filled 2020-12-24: qty 10

## 2020-12-24 MED ORDER — PHENYLEPHRINE 40 MCG/ML (10ML) SYRINGE FOR IV PUSH (FOR BLOOD PRESSURE SUPPORT)
PREFILLED_SYRINGE | INTRAVENOUS | Status: AC
  Filled 2020-12-24: qty 10

## 2020-12-24 MED ORDER — DEXAMETHASONE SODIUM PHOSPHATE 4 MG/ML IJ SOLN
INTRAMUSCULAR | Status: DC | PRN
  Administered 2020-12-24: 4 mg via INTRAVENOUS

## 2020-12-24 MED ORDER — LIDOCAINE HCL (CARDIAC) PF 100 MG/5ML IV SOSY
PREFILLED_SYRINGE | INTRAVENOUS | Status: DC | PRN
  Administered 2020-12-24: 50 mg via INTRAVENOUS

## 2020-12-24 MED ORDER — PANTOPRAZOLE SODIUM 40 MG PO TBEC
40.0000 mg | DELAYED_RELEASE_TABLET | Freq: Every day | ORAL | Status: DC
  Administered 2020-12-24 – 2020-12-26 (×3): 40 mg via ORAL
  Filled 2020-12-24 (×4): qty 1

## 2020-12-24 NOTE — Anesthesia Procedure Notes (Signed)
Date/Time: 12/24/2020 1:50 PM Performed by: Orlie Dakin, CRNA Pre-anesthesia Checklist: Patient identified, Emergency Drugs available, Suction available and Patient being monitored Patient Re-evaluated:Patient Re-evaluated prior to induction Oxygen Delivery Method: Nasal cannula Induction Type: IV induction Placement Confirmation: positive ETCO2

## 2020-12-24 NOTE — Progress Notes (Signed)
Progress Note  Patient Name: MARCINE GADWAY Date of Encounter: 12/24/2020  [facility] Cardiologist: Carlyle Dolly, MD   Subjective   No complaints  Inpatient Medications    Scheduled Meds:  Chlorhexidine Gluconate Cloth  6 each Topical Daily   furosemide  20 mg Intravenous Q12H   levothyroxine  50 mcg Oral QAC breakfast   sodium chloride flush  3 mL Intravenous Q12H   Continuous Infusions:  sodium chloride     sodium chloride     octreotide  (SANDOSTATIN)    IV infusion 50 mcg/hr (12/24/20 0739)   pantoprazole 8 mg/hr (12/24/20 0739)   PRN Meds: sodium chloride, acetaminophen **OR** acetaminophen, albuterol, fluticasone, ondansetron **OR** ondansetron (ZOFRAN) IV, sodium chloride flush   Vital Signs    Vitals:   12/23/20 2032 12/24/20 0051 12/24/20 0445 12/24/20 0742  BP:      Pulse:      Resp:      Temp: 98.5 F (36.9 C) 97.7 F (36.5 C) (!) 97 F (36.1 C) 98.3 F (36.8 C)  TempSrc: Oral Oral Axillary Axillary  SpO2:      Weight:   102.7 kg   Height:        Intake/Output Summary (Last 24 hours) at 12/24/2020 0820 Last data filed at 12/24/2020 0739 Gross per 24 hour  Intake 890.45 ml  Output --  Net 890.45 ml   Last 3 Weights 12/24/2020 12/22/2020 12/22/2020  Weight (lbs) 226 lb 6.6 oz 225 lb 15.5 oz 240 lb  Weight (kg) 102.7 kg 102.5 kg 108.863 kg      Telemetry   SR 80s-100s- Personally Reviewed  ECG    N/a- Personally Reviewed  Physical Exam   GEN: No acute distress.   Neck: No JVD Cardiac: RRR, 2/6 systolic murmur rusb Respiratory: Clear to auscultation bilaterally. GI: Soft, nontender, non-distended  MS: No edema; No deformity. Neuro:  Nonfocal  Psych: Normal affect   Labs    High Sensitivity Troponin:   Recent Labs  Lab 12/22/20 0843 12/22/20 1451 12/23/20 0811 12/23/20 1007  TROPONINIHS 36* 112* 736* 741*      Chemistry Recent Labs  Lab 12/22/20 0843 12/22/20 0852 12/23/20 0438  NA 140 143 141  K 3.8 3.8  4.2  CL 112* 112* 113*  CO2 19*  --  22  GLUCOSE 143* 138* 143*  BUN 59* 60* 38*  CREATININE 1.00 0.90 1.07*  CALCIUM 8.1*  --  8.0*  PROT 6.5  --  6.0*  ALBUMIN 3.6  --  3.3*  AST 17  --  18  ALT 12  --  13  ALKPHOS 45  --  47  BILITOT 0.4  --  1.1  GFRNONAA >60  --  56*  ANIONGAP 9  --  6     Hematology Recent Labs  Lab 12/22/20 0843 12/22/20 0852 12/23/20 0438 12/24/20 0644  WBC 16.4*  --  15.5* 13.5*  RBC 2.43*  --  3.07* 2.69*  HGB 6.3* 6.5* 8.1* 7.2*  HCT 21.0* 19.0* 28.5* 24.2*  MCV 86.4  --  92.8 90.0  MCH 25.9*  --  26.4 26.8  MCHC 30.0  --  28.4* 29.8*  RDW 15.6*  --  16.9* 17.6*  PLT 337  --  303 286    BNP Recent Labs  Lab 12/22/20 0843 12/23/20 0439  BNP 133.0* 393.0*     DDimer  Recent Labs  Lab 12/23/20 0850  DDIMER 0.35     Radiology  DG Chest 1 View  Result Date: 12/23/2020 CLINICAL DATA:  Respiratory failure EXAM: CHEST  1 VIEW COMPARISON:  Film from the previous day. FINDINGS: Cardiac shadow is stable. Lungs are well aerated bilaterally. Mild interstitial edema is noted. No sizable effusion is noted. No bony abnormality is seen. IMPRESSION: New onset mild interstitial edema. Electronically Signed   By: Inez Catalina M.D.   On: 12/23/2020 15:01   DG Chest Port 1 View  Result Date: 12/22/2020 CLINICAL DATA:  Shortness of breath. Shortness of breath with exertion, black stools, dizziness when standing. EXAM: PORTABLE CHEST 1 VIEW COMPARISON:  Prior chest radiographs 01/02/2010 and earlier. FINDINGS: Cardiomegaly, unchanged. Central pulmonary vascular congestion and mild interstitial edema. Mild opacity within the lateral left lung base. No appreciable consolidation within the right lung. No evidence of pleural effusion or pneumothorax. No acute bony abnormality identified. IMPRESSION: Cardiomegaly. Pulmonary vascular congestion and mild interstitial edema. Mild opacity within the lateral left lung base, favored to reflect atelectasis.  Pneumonia is difficult to definitively exclude and clinical correlation is recommended. Electronically Signed   By: Kellie Simmering DO   On: 12/22/2020 09:28   DG Chest Port 1V same Day  Result Date: 12/22/2020 CLINICAL DATA:  Short of breath hypotension EXAM: PORTABLE CHEST 1 VIEW COMPARISON:  12/22/2020 FINDINGS: Heart size upper normal. Negative for heart failure. Lungs remain clear without infiltrate or effusion. IMPRESSION: No active disease. Electronically Signed   By: Franchot Gallo M.D.   On: 12/22/2020 16:25   US ABDOMEN COMPLETE W/ELASTOGRAPHY  Result Date: 12/23/2020 CLINICAL DATA:  Nonalcoholic fatty liver EXAM: ULTRASOUND ABDOMEN ULTRASOUND HEPATIC ELASTOGRAPHY TECHNIQUE: Sonography of the upper abdomen was performed. In addition, ultrasound elastography evaluation of the liver was performed. A region of interest was placed within the right lobe of the liver. Following application of a compressive sonographic pulse, tissue compressibility was assessed. Multiple assessments were performed at the selected site. Median tissue compressibility was determined. Previously, hepatic stiffness was assessed by shear wave velocity. Based on recently published Society of Radiologists in Ultrasound consensus article, reporting is now recommended to be performed in the SI units of pressure (kiloPascals) representing hepatic stiffness/elasticity. The obtained result is compared to the published reference standards. (cACLD = compensated Advanced Chronic Liver Disease) COMPARISON:  None. FINDINGS: ULTRASOUND ABDOMEN Gallbladder: Status post cholecystectomy. Common bile duct: Diameter: 5 mm Liver: No focal lesion identified. Increased parenchymal echogenicity. Portal vein is patent on color Doppler imaging with normal direction of blood flow towards the liver. IVC: No abnormality visualized. Pancreas: Obscured by overlying bowel gas. Spleen: Size and appearance within normal limits. Right Kidney: Length: 15.0 cm.  Enlarged by multiple cysts, some of which are thinly septated. No solid mass or hydronephrosis visualized. Left Kidney: Length: 11.8 cm. Echogenicity within normal limits. No mass or hydronephrosis visualized. Abdominal aorta: No aneurysm visualized. Other findings: None. ULTRASOUND HEPATIC ELASTOGRAPHY Device: Siemens Helix VTQ Patient position: Oblique Transducer 5C1 Number of measurements: 15 Hepatic segment:  8 Median kPa: 2.1 IQR: 0.1 IQR/Median kPa ratio: 0.05 Data quality:  Good Diagnostic category:  < or = 5 kPa: high probability of being normal The use of hepatic elastography is applicable to patients with viral hepatitis and non-alcoholic fatty liver disease. At this time, there is insufficient data for the referenced cut-off values and use in other causes of liver disease, including alcoholic liver disease. Patients, however, may be assessed by elastography and serve as their own reference standard/baseline. In patients with non-alcoholic liver disease, the values suggesting compensated  advanced chronic liver disease (cACLD) may be lower, and patients may need additional testing with elasticity results of 7-9 kPa. Please note that abnormal hepatic elasticity and shear wave velocities may also be identified in clinical settings other than with hepatic fibrosis, such as: acute hepatitis, elevated right heart and central venous pressures including use of beta blockers, veno-occlusive disease (Budd-Chiari), infiltrative processes such as mastocytosis/amyloidosis/infiltrative tumor/lymphoma, extrahepatic cholestasis, with hyperemia in the post-prandial state, and with liver transplantation. Correlation with patient history, laboratory data, and clinical condition recommended. Diagnostic Categories: < or =5 kPa: high probability of being normal < or =9 kPa: in the absence of other known clinical signs, rules out cACLD >9 kPa and ?13 kPa: suggestive of cACLD, but needs further testing >13 kPa: highly suggestive  of cACLD > or =17 kPa: highly suggestive of cACLD with an increased probability of clinically significant portal hypertension IMPRESSION: ULTRASOUND ABDOMEN: 1.  Hepatic steatosis. 2.  Status post cholecystectomy. ULTRASOUND HEPATIC ELASTOGRAPHY: Median kPa:  2.1 Diagnostic category:  < or = 5 kPa: high probability of being normal Electronically Signed   By: Eddie Candle M.D.   On: 12/23/2020 11:35   ECHOCARDIOGRAM COMPLETE  Result Date: 12/23/2020    ECHOCARDIOGRAM REPORT   Patient Name:   JUNE RODE Date of Exam: 12/23/2020 Medical Rec #:  [id number]          Height:       62.0 in Accession #:    [id number]         Weight:       226.0 lb Date of Birth:  1951-02-14          BSA:          2.014 m Patient Age:    65 years           BP:           101/41 mmHg Patient Gender: F                  HR:           85 bpm. Exam Location:  [facility] Procedure: 2D Echo, Cardiac Doppler and Color Doppler Indications:    Elevated Troponin  History:        Patient has prior history of Echocardiogram examinations, most                 recent 01/19/2018. Risk Factors:Hypertension and Dyslipidemia.  Sonographer:    Wenda Low Referring Phys: [id number] [place]  1. Severe thickening of the proximal interventricular septum LVOT is narrow There is systolic anterior motion of mitral chordae Turbulent flow through LV/LVOT Peak gradkient through LV/LVOT/AV is 90 mm Hg consistent with HOCM physiology. Left ventricular ejection fraction, by estimation, is >75%. The left ventricle has hyperdynamic function. The left ventricle has no regional wall motion abnormalities. There is severe asymmetric left ventricular hypertrophy.  2. Right ventricular systolic function is normal. The right ventricular size is normal. There is mildly elevated pulmonary artery systolic pressure.  3. Left atrial size was severely dilated.  4. Mild to moderate mitral valve regurgitation.  5. The aortic valve is tricuspid. Aortic valve  regurgitation is not visualized. Mild to moderate aortic valve sclerosis/calcification is present, without any evidence of aortic stenosis.  6. The inferior vena cava is normal in size with greater than 50% respiratory variability, suggesting right atrial pressure of 3 mmHg. FINDINGS  Left Ventricle: Severe thickening of the proximal interventricular septum LVOT is narrow There  is systolic anterior motion of mitral chordae Turbulent flow through LV/LVOT Peak gradkient through LV/LVOT/AV is 90 mm Hg. Consistent with IHSS. Left ventricular ejection fraction, by estimation, is >75%. The left ventricle has hyperdynamic function. The left ventricle has no regional wall motion abnormalities. The left ventricular internal cavity size was normal in size. There is severe asymmetric left ventricular hypertrophy. Right Ventricle: The right ventricular size is normal. Right vetricular wall thickness was not assessed. Right ventricular systolic function is normal. There is mildly elevated pulmonary artery systolic pressure. The tricuspid regurgitant velocity is 2.50 m/s, and with an assumed right atrial pressure of 3 mmHg, the estimated right ventricular systolic pressure is 84.1 mmHg. Left Atrium: Left atrial size was severely dilated. Right Atrium: Right atrial size was normal in size. Pericardium: There is no evidence of pericardial effusion. Mitral Valve: Mild mitral annular calcification. Mild to moderate mitral valve regurgitation. MV peak gradient, 10.1 mmHg. The mean mitral valve gradient is 5.0 mmHg. Tricuspid Valve: The tricuspid valve is normal in structure. Tricuspid valve regurgitation is trivial. Aortic Valve: The aortic valve is tricuspid. Aortic valve regurgitation is not visualized. Mild to moderate aortic valve sclerosis/calcification is present, without any evidence of aortic stenosis. Aortic valve mean gradient measures 16.3 mmHg. Aortic valve peak gradient measures 33.3 mmHg. Aortic valve area, by VTI  measures 2.39 cm. Pulmonic Valve: The pulmonic valve was not well visualized. Pulmonic valve regurgitation is trivial. Aorta: The aortic root and ascending aorta are structurally normal, with no evidence of dilitation. Venous: The inferior vena cava is normal in size with greater than 50% respiratory variability, suggesting right atrial pressure of 3 mmHg. IAS/Shunts: The interatrial septum was not assessed.  LEFT VENTRICLE PLAX 2D LVIDd:         5.08 cm  Diastology LVIDs:         3.24 cm  LV e' medial:    4.98 cm/s LV PW:         1.29 cm  LV E/e' medial:  32.7 LV IVS:        1.36 cm  LV e' lateral:   12.70 cm/s LVOT diam:     2.00 cm  LV E/e' lateral: 12.8 LV SV:         146 LV SV Index:   73 LVOT Area:     3.14 cm  RIGHT VENTRICLE RV S prime:     35.15 cm/s TAPSE (M-mode): 3.6 cm LEFT ATRIUM              Index       RIGHT ATRIUM           Index LA diam:        5.80 cm  2.88 cm/m  RA Area:     14.20 cm LA Vol (A2C):   78.4 ml  38.94 ml/m RA Volume:   29.90 ml  14.85 ml/m LA Vol (A4C):   118.0 ml 58.60 ml/m LA Biplane Vol: 96.9 ml  48.13 ml/m  AORTIC VALVE AV Area (Vmax):    1.03 cm AV Area (Vmean):   2.45 cm AV Area (VTI):     2.39 cm AV Vmax:           288.33 cm/s AV Vmean:          185.000 cm/s AV VTI:            0.612 m AV Peak Grad:      33.3 mmHg AV Mean Grad:      16.3  mmHg LVOT Vmax:         94.50 cm/s LVOT Vmean:        144.000 cm/s LVOT VTI:          0.466 m LVOT/AV VTI ratio: 0.76  AORTA Ao Root diam: 2.80 cm Ao Asc diam:  2.90 cm MITRAL VALVE                TRICUSPID VALVE MV Area (PHT): 1.90 cm     TR Peak grad:   25.0 mmHg MV Area VTI:   3.48 cm     TR Vmax:        250.00 cm/s MV Peak grad:  10.1 mmHg MV Mean grad:  5.0 mmHg     SHUNTS MV Vmax:       1.59 m/s     Systemic VTI:  0.47 m MV Vmean:      95.8 cm/s    Systemic Diam: 2.00 cm MV Decel Time: 400 msec MV E velocity: 163.00 cm/s MV A velocity: 142.00 cm/s MV E/A ratio:  1.15 Dorris Carnes MD Electronically signed by Dorris Carnes MD  Signature Date/Time: 12/23/2020/5:13:12 PM    Final     Cardiac Studies    Patient Profile    AZAYA GOEDDE is a 70 y.o. female with a hx of chest pain and DOE who is being seen 12/23/2020 for the evaluation of elevated troponins at the request of Dr. Manuella Ghazi.  Assessment & Plan    GI bleed - presented with melena -admit Hgb 6.3, has been transfused 2 units   2. Elevated troponin - 2019 nuclear stress no significant ischemia.  - in setting of severe anemia, hypoxia on admission - trop up to 741. EKG chronic lateral ST depressions - echo LVE >75%, elevated dynamic LVOT gradient peak gradient 90 mmHg, severe septal hypertrophy  - elevated trop likely secondary to anemia, hypoxia. Increased demand in setting of hyperdynamic LV and elevated LVOT gradient. - no plans for ischemic testing at this time.   3. Dynamic LVOT gradient - looks to be focal basal septal hypertrophy with narrow LVOT, in the setting of a hyperdynamic LV caused by severe anemia she has developed a significant dynamic LVOT gradient - maintain Hgb 8-9, avoid hypovolemia.  - some issues with low bp this admit, this AM SBP 110s-120s, start lopressor 12.5mg  bid - consider outpatient cardiac MRI to better evaluate degree and distribution of hypertrophy.  4. Pulmonary edema - CXR with pulm edema,BNP 133-->393 yesterday - echo hyperdyanmic LV, indet diastolic function but severe LAE would suggest abnormal function, dynamic LVOT gradient - gentle diuresis in setting of dynamic LVOT gradient.    From cardiac standpoint no contraindication to proceeding with EGD today, weaning O2 today.     For questions or updates, please contact [place] Please consult [email or web address] for contact info under        Signed, Carlyle Dolly, MD  12/24/2020, 8:20 AM

## 2020-12-24 NOTE — Transfer of Care (Signed)
Immediate Anesthesia Transfer of Care Note  Patient: Bethany Cunningham  Procedure(s) Performed: ESOPHAGOGASTRODUODENOSCOPY (EGD) WITH PROPOFOL GIVENS CAPSULE STUDY BIOPSY  Patient Location: PACU  Anesthesia Type:General  Level of Consciousness: awake, alert  and oriented  Airway & Oxygen Therapy: Patient Spontanous Breathing and Patient connected to face mask oxygen  Post-op Assessment: Report given to RN, Post -op Vital signs reviewed and stable and Patient moving all extremities X 4  Post vital signs: Reviewed and stable  Last Vitals:  Vitals Value Taken Time  BP 109/52 12/24/20 1413  Temp    Pulse 76 12/24/20 1417  Resp 21 12/24/20 1417  SpO2 97 % 12/24/20 1417  Vitals shown include unvalidated device data.  Last Pain:  Vitals:   12/24/20 1344  TempSrc:   PainSc: 0-No pain         Complications: No notable events documented.

## 2020-12-24 NOTE — Brief Op Note (Signed)
12/22/2020 - 12/24/2020  2:06 PM  PATIENT:  Bethany Cunningham  70 y.o. female  PRE-OPERATIVE DIAGNOSIS:  acute blood loss anemia, melena  POST-OPERATIVE DIAGNOSIS:  schatzki ring; duodenal nodularity;   PROCEDURE:  Procedure(s): ESOPHAGOGASTRODUODENOSCOPY (EGD) WITH PROPOFOL (N/A) GIVENS CAPSULE STUDY BIOPSY  SURGEON:  Surgeon(s) and Role:    * Harvel Quale, MD - Primary  Patient underwent EGD under propofol sedation.  Tolerated the procedure adequately.  Esophagus was within normal limits.  Stomach was normal, there was no presence of blood upon careful inspection.  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  Otherwise, no presence of any alterations in the examined duodenum.  A capsule endoscopy was deployed successfully.  RECOMMENDATIONS: - Return patient to hospital ward. - Clear liquid diet today.  - Follow up path results. - Check H/H every 12 hours. - Follow capsule endoscopy reading. - Stop PPI drip.  Maylon Peppers, MD Gastroenterology and Hepatology [facility] \

## 2020-12-24 NOTE — Anesthesia Postprocedure Evaluation (Signed)
Anesthesia Post Note  Patient: Bethany Cunningham  Procedure(s) Performed: ESOPHAGOGASTRODUODENOSCOPY (EGD) WITH PROPOFOL GIVENS CAPSULE STUDY BIOPSY  Patient location during evaluation: Phase II Anesthesia Type: General Level of consciousness: awake and alert and oriented Pain management: pain level controlled Vital Signs Assessment: post-procedure vital signs reviewed and stable Respiratory status: spontaneous breathing, respiratory function stable and patient connected to face mask oxygen (on mask 4liters of O2) Cardiovascular status: blood pressure returned to baseline and stable Postop Assessment: no apparent nausea or vomiting Anesthetic complications: no   No notable events documented.   Last Vitals:  Vitals:   12/24/20 1430 12/24/20 1445  BP: (!) 101/59 (!) 106/59  Pulse: 80 76  Resp: (!) 25 (!) 22  Temp:    SpO2: 96% 98%    Last Pain:  Vitals:   12/24/20 1445  TempSrc:   PainSc: 0-No pain                 Jaeleen Inzunza C Makell Cyr

## 2020-12-24 NOTE — Progress Notes (Addendum)
Plans for EGD today. Hematoglobin down to 7.2 this morning from 8.1 yesterday.  No overt GI bleeding.  1 unit PRBCs have been ordered this morning.  Creatinine, sodium, potassium within normal limits.  Cardiology has cleared patient for EGD. Will proceed with EGD this afternoon as planned.   Aliene Altes, PA-C [facility] 12/24/2020   We will proceed with EGD as scheduled.  I thoroughly discussed with the patient his procedure, including the risks involved. Patient understands what the procedure involves including the benefits and any risks. Patient understands alternatives to the proposed procedure. Risks including (but not limited to) bleeding, tearing of the lining (perforation), rupture of adjacent organs, problems with heart and lung function, infection, and medication reactions. A small percentage of complications may require surgery, hospitalization, repeat endoscopic procedure, and/or transfusion.  Patient understood and agreed.  Maylon Peppers, MD Gastroenterology and Hepatology [facility]

## 2020-12-24 NOTE — Progress Notes (Signed)
PROGRESS NOTE    Bethany Cunningham  WIO:[id number] DOB: Feb 19, 1951 DOA: 12/22/2020 PCP: Celene Squibb, MD   Brief Narrative:   Bethany Cunningham is a 70 y.o. female with medical history significant for morbid obesity, hypertension, dyslipidemia, and hypothyroidism who presented to the ED with complaints of black, tarry stools that began on 6/18.  She was admitted with acute blood loss anemia suspected secondary to upper GI bleed and had been started on PPI infusion as well as octreotide.  She has been transfused 2 units of PRBCs with stability overnight.  Troponins had elevated and she was noted to be more hypoxemic and therefore cardiology was consulted for evaluation.  This appears to be related to demand ischemia in the setting of her severe anemia.  She is noted to be mildly volume overloaded and Lasix has been initiated for diuresis.  Patient has undergone upper endoscopy on 6/21 with no significant findings of bleeding.  Small capsule study has been initiated.  Plans are to monitor repeat H&H every 12 hours and maintain on oral PPI.  Diet has been advanced to clear liquid.  GI following.  Assessment & Plan:   Active Problems:   Fatty liver disease, nonalcoholic   Acute blood loss anemia   Upper GI bleed   Acute blood loss anemia suspected secondary to upper GI bleed-stable -Status post EGD 6/21 with no acute findings, capsule study now pending -Status post 3 unit PRBCs -Clear liquid diet  -GI consult with plans for upper endoscopy in a.m. -Remote use of Goody powders -Monitor repeat H/H BID -Protonix infusion, now changed to oral Protonix   Acute hypoxemic respiratory failure with dyspnea on exertion/near syncope -Related to some pulmonary edema -Appears to have some signs of volume overload on chest x-ray and with noted elevated BNP -IV Lasix 20 mg twice daily ordered with improvement in dyspnea, continue in the setting of dynamic LVOT gradient -2D echocardiogram with dynamic  LVOT gradient, will require cardiac MRI in the outpatient setting to better evaluate the degree and distribution of hypertrophy per cardiology   Troponin elevation -Appears to be demand ischemia, appreciate ongoing cardiology evaluation -2D echocardiogram with no signs of wall motion abnormalities, but dynamic LVOT gradient   History of hypertension -Currently hypotensive, holding home medications   Dyslipidemia -Holding statin and fish oils   History of hypothyroidism -Continue levothyroxine   Morbid obesity -Lifestyle changes outpatient     DVT prophylaxis:SCDs Code Status: Full Family Communication:None at bedside  Disposition Plan:  Status is: Inpatient   Remains inpatient appropriate because:Ongoing diagnostic testing needed not appropriate for outpatient work up, IV treatments appropriate due to intensity of illness or inability to take PO, and Inpatient level of care appropriate due to severity of illness   Dispo: The patient is from: Home              Anticipated d/c is to: Home              Patient currently is not medically stable to d/c.              Difficult to place patient No     Consultants:  GI Cardio   Procedures:  See below   Antimicrobials:  None  Subjective: Patient seen and evaluated today with no new acute complaints or concerns. No acute concerns or events noted overnight.  Continues to require high amounts of oxygen.  Objective: Vitals:   12/24/20 1415 12/24/20 1417 12/24/20 1430 12/24/20 1445  BP: Marland Kitchen)  109/52  (!) 101/59 (!) 106/59  Pulse: 78 76 80 76  Resp: 15 18 (!) 25 (!) 22  Temp: 98.6 F (37 C)     TempSrc:      SpO2: (!) 86% 97% 96% 98%  Weight:      Height:        Intake/Output Summary (Last 24 hours) at 12/24/2020 1454 Last data filed at 12/24/2020 1407 Gross per 24 hour  Intake 1519.55 ml  Output --  Net 1519.55 ml   Filed Weights   12/22/20 0822 12/22/20 1601 12/24/20 0445  Weight: 108.9 kg 102.5 kg 102.7 kg     Examination:  General exam: Appears calm and comfortable  Respiratory system: Clear to auscultation. Respiratory effort normal.  Currently on 10 L nasal cannula oxygen. Cardiovascular system: S1 & S2 heard, RRR.  Gastrointestinal system: Abdomen is soft Central nervous system: Alert and awake Extremities: No edema Skin: No significant lesions noted Psychiatry: Flat affect.    Data Reviewed: I have personally reviewed following labs and imaging studies  CBC: Recent Labs  Lab 12/22/20 0843 12/22/20 0852 12/23/20 0438 12/24/20 0644  WBC 16.4*  --  15.5* 13.5*  NEUTROABS 13.8*  --   --   --   HGB 6.3* 6.5* 8.1* 7.2*  HCT 21.0* 19.0* 28.5* 24.2*  MCV 86.4  --  92.8 90.0  PLT 337  --  303 096   Basic Metabolic Panel: Recent Labs  Lab 12/22/20 0843 12/22/20 0852 12/22/20 1451 12/23/20 0438 12/24/20 0644  NA 140 143  --  141 142  K 3.8 3.8  --  4.2 4.0  CL 112* 112*  --  113* 114*  CO2 19*  --   --  22 23  GLUCOSE 143* 138*  --  143* 133*  BUN 59* 60*  --  38* 29*  CREATININE 1.00 0.90  --  1.07* 0.93  CALCIUM 8.1*  --   --  8.0* 8.0*  MG  --   --  2.2  --  2.3   GFR: Estimated Creatinine Clearance: 62.3 mL/min (by C-G formula based on SCr of 0.93 mg/dL). Liver Function Tests: Recent Labs  Lab 12/22/20 0843 12/23/20 0438  AST 17 18  ALT 12 13  ALKPHOS 45 47  BILITOT 0.4 1.1  PROT 6.5 6.0*  ALBUMIN 3.6 3.3*   No results for input(s): LIPASE, AMYLASE in the last 168 hours. No results for input(s): AMMONIA in the last 168 hours. Coagulation Profile: Recent Labs  Lab 12/22/20 1451  INR 1.1   Cardiac Enzymes: No results for input(s): CKTOTAL, CKMB, CKMBINDEX, TROPONINI in the last 168 hours. BNP (last 3 results) No results for input(s): PROBNP in the last 8760 hours. HbA1C: No results for input(s): HGBA1C in the last 72 hours. CBG: No results for input(s): GLUCAP in the last 168 hours. Lipid Profile: No results for input(s): CHOL, HDL, LDLCALC,  TRIG, CHOLHDL, LDLDIRECT in the last 72 hours. Thyroid Function Tests: No results for input(s): TSH, T4TOTAL, FREET4, T3FREE, THYROIDAB in the last 72 hours. Anemia Panel: No results for input(s): VITAMINB12, FOLATE, FERRITIN, TIBC, IRON, RETICCTPCT in the last 72 hours. Sepsis Labs: Recent Labs  Lab 12/23/20 0850  PROCALCITON <0.10    Recent Results (from the past 240 hour(s))  Resp Panel by RT-PCR (Flu A&B, Covid) Nasopharyngeal Swab     Status: None   Collection Time: 12/22/20  9:15 AM   Specimen: Nasopharyngeal Swab; Nasopharyngeal(NP) swabs in vial transport medium  Result Value Ref  Range Status   SARS Coronavirus 2 by RT PCR NEGATIVE NEGATIVE Final    Comment: (NOTE) SARS-CoV-2 target nucleic acids are NOT DETECTED.  The SARS-CoV-2 RNA is generally detectable in upper respiratory specimens during the acute phase of infection. The lowest concentration of SARS-CoV-2 viral copies this assay can detect is 138 copies/mL. A negative result does not preclude SARS-Cov-2 infection and should not be used as the sole basis for treatment or other patient management decisions. A negative result may occur with  improper specimen collection/handling, submission of specimen other than nasopharyngeal swab, presence of viral mutation(s) within the areas targeted by this assay, and inadequate number of viral copies(<138 copies/mL). A negative result must be combined with clinical observations, patient history, and epidemiological information. The expected result is Negative.  Fact Sheet for Patients:  EntrepreneurPulse.com.au  Fact Sheet for Healthcare Providers:  IncredibleEmployment.be  This test is no t yet approved or cleared by the [place] FDA and  has been authorized for detection and/or diagnosis of SARS-CoV-2 by FDA under an Emergency Use Authorization (EUA). This EUA will remain  in effect (meaning this test can be used) for the duration  of the COVID-19 declaration under Section 564(b)(1) of the Act, 21 U.S.C.section 360bbb-3(b)(1), unless the authorization is terminated  or revoked sooner.       Influenza A by PCR NEGATIVE NEGATIVE Final   Influenza B by PCR NEGATIVE NEGATIVE Final    Comment: (NOTE) The Xpert Xpress SARS-CoV-2/FLU/RSV plus assay is intended as an aid in the diagnosis of influenza from Nasopharyngeal swab specimens and should not be used as a sole basis for treatment. Nasal washings and aspirates are unacceptable for Xpert Xpress SARS-CoV-2/FLU/RSV testing.  Fact Sheet for Patients: EntrepreneurPulse.com.au  Fact Sheet for Healthcare Providers: IncredibleEmployment.be  This test is not yet approved or cleared by the [place] FDA and has been authorized for detection and/or diagnosis of SARS-CoV-2 by FDA under an Emergency Use Authorization (EUA). This EUA will remain in effect (meaning this test can be used) for the duration of the COVID-19 declaration under Section 564(b)(1) of the Act, 21 U.S.C. section 360bbb-3(b)(1), unless the authorization is terminated or revoked.  Performed at [facility], [address], [place], [place] [address]   MRSA Next Gen by PCR, Nasal     Status: None   Collection Time: 12/22/20  5:00 PM   Specimen: Nasal Mucosa; Nasal Swab  Result Value Ref Range Status   MRSA by PCR Next Gen NOT DETECTED NOT DETECTED Final    Comment: (NOTE) The GeneXpert MRSA Assay (FDA approved for NASAL specimens only), is one component of a comprehensive MRSA colonization surveillance program. It is not intended to diagnose MRSA infection nor to guide or monitor treatment for MRSA infections. Test performance is not FDA approved in patients less than 53 years old. Performed at [facility], [address] [place], [place] [address]          Radiology Studies: DG Chest 1 View  Result Date: 12/23/2020 CLINICAL DATA:  Respiratory  failure EXAM: CHEST  1 VIEW COMPARISON:  Film from the previous day. FINDINGS: Cardiac shadow is stable. Lungs are well aerated bilaterally. Mild interstitial edema is noted. No sizable effusion is noted. No bony abnormality is seen. IMPRESSION: New onset mild interstitial edema. Electronically Signed   By: Inez Catalina M.D.   On: 12/23/2020 15:01   DG Chest Port 1V same Day  Result Date: 12/22/2020 CLINICAL DATA:  Short of breath hypotension EXAM: PORTABLE  CHEST 1 VIEW COMPARISON:  12/22/2020 FINDINGS: Heart size upper normal. Negative for heart failure. Lungs remain clear without infiltrate or effusion. IMPRESSION: No active disease. Electronically Signed   By: Franchot Gallo M.D.   On: 12/22/2020 16:25   US ABDOMEN COMPLETE W/ELASTOGRAPHY  Result Date: 12/23/2020 CLINICAL DATA:  Nonalcoholic fatty liver EXAM: ULTRASOUND ABDOMEN ULTRASOUND HEPATIC ELASTOGRAPHY TECHNIQUE: Sonography of the upper abdomen was performed. In addition, ultrasound elastography evaluation of the liver was performed. A region of interest was placed within the right lobe of the liver. Following application of a compressive sonographic pulse, tissue compressibility was assessed. Multiple assessments were performed at the selected site. Median tissue compressibility was determined. Previously, hepatic stiffness was assessed by shear wave velocity. Based on recently published Society of Radiologists in Ultrasound consensus article, reporting is now recommended to be performed in the SI units of pressure (kiloPascals) representing hepatic stiffness/elasticity. The obtained result is compared to the published reference standards. (cACLD = compensated Advanced Chronic Liver Disease) COMPARISON:  None. FINDINGS: ULTRASOUND ABDOMEN Gallbladder: Status post cholecystectomy. Common bile duct: Diameter: 5 mm Liver: No focal lesion identified. Increased parenchymal echogenicity. Portal vein is patent on color Doppler imaging with normal  direction of blood flow towards the liver. IVC: No abnormality visualized. Pancreas: Obscured by overlying bowel gas. Spleen: Size and appearance within normal limits. Right Kidney: Length: 15.0 cm. Enlarged by multiple cysts, some of which are thinly septated. No solid mass or hydronephrosis visualized. Left Kidney: Length: 11.8 cm. Echogenicity within normal limits. No mass or hydronephrosis visualized. Abdominal aorta: No aneurysm visualized. Other findings: None. ULTRASOUND HEPATIC ELASTOGRAPHY Device: Siemens Helix VTQ Patient position: Oblique Transducer 5C1 Number of measurements: 15 Hepatic segment:  8 Median kPa: 2.1 IQR: 0.1 IQR/Median kPa ratio: 0.05 Data quality:  Good Diagnostic category:  < or = 5 kPa: high probability of being normal The use of hepatic elastography is applicable to patients with viral hepatitis and non-alcoholic fatty liver disease. At this time, there is insufficient data for the referenced cut-off values and use in other causes of liver disease, including alcoholic liver disease. Patients, however, may be assessed by elastography and serve as their own reference standard/baseline. In patients with non-alcoholic liver disease, the values suggesting compensated advanced chronic liver disease (cACLD) may be lower, and patients may need additional testing with elasticity results of 7-9 kPa. Please note that abnormal hepatic elasticity and shear wave velocities may also be identified in clinical settings other than with hepatic fibrosis, such as: acute hepatitis, elevated right heart and central venous pressures including use of beta blockers, veno-occlusive disease (Budd-Chiari), infiltrative processes such as mastocytosis/amyloidosis/infiltrative tumor/lymphoma, extrahepatic cholestasis, with hyperemia in the post-prandial state, and with liver transplantation. Correlation with patient history, laboratory data, and clinical condition recommended. Diagnostic Categories: < or =5 kPa:  high probability of being normal < or =9 kPa: in the absence of other known clinical signs, rules out cACLD >9 kPa and ?13 kPa: suggestive of cACLD, but needs further testing >13 kPa: highly suggestive of cACLD > or =17 kPa: highly suggestive of cACLD with an increased probability of clinically significant portal hypertension IMPRESSION: ULTRASOUND ABDOMEN: 1.  Hepatic steatosis. 2.  Status post cholecystectomy. ULTRASOUND HEPATIC ELASTOGRAPHY: Median kPa:  2.1 Diagnostic category:  < or = 5 kPa: high probability of being normal Electronically Signed   By: Eddie Candle M.D.   On: 12/23/2020 11:35   ECHOCARDIOGRAM COMPLETE  Result Date: 12/23/2020    ECHOCARDIOGRAM REPORT   Patient Name:  Merita L Gopaul Date of Exam: 12/23/2020 Medical Rec #:  [id number]          Height:       62.0 in Accession #:    [id number]         Weight:       226.0 lb Date of Birth:  1950-12-20          BSA:          2.014 m Patient Age:    78 years           BP:           101/41 mmHg Patient Gender: F                  HR:           85 bpm. Exam Location:  [facility] Procedure: 2D Echo, Cardiac Doppler and Color Doppler Indications:    Elevated Troponin  History:        Patient has prior history of Echocardiogram examinations, most                 recent 01/19/2018. Risk Factors:Hypertension and Dyslipidemia.  Sonographer:    Wenda Low Referring Phys: [id number] [place]  1. Severe thickening of the proximal interventricular septum LVOT is narrow There is systolic anterior motion of mitral chordae Turbulent flow through LV/LVOT Peak gradkient through LV/LVOT/AV is 90 mm Hg consistent with HOCM physiology. Left ventricular ejection fraction, by estimation, is >75%. The left ventricle has hyperdynamic function. The left ventricle has no regional wall motion abnormalities. There is severe asymmetric left ventricular hypertrophy.  2. Right ventricular systolic function is normal. The right ventricular size is normal.  There is mildly elevated pulmonary artery systolic pressure.  3. Left atrial size was severely dilated.  4. Mild to moderate mitral valve regurgitation.  5. The aortic valve is tricuspid. Aortic valve regurgitation is not visualized. Mild to moderate aortic valve sclerosis/calcification is present, without any evidence of aortic stenosis.  6. The inferior vena cava is normal in size with greater than 50% respiratory variability, suggesting right atrial pressure of 3 mmHg. FINDINGS  Left Ventricle: Severe thickening of the proximal interventricular septum LVOT is narrow There is systolic anterior motion of mitral chordae Turbulent flow through LV/LVOT Peak gradkient through LV/LVOT/AV is 90 mm Hg. Consistent with IHSS. Left ventricular ejection fraction, by estimation, is >75%. The left ventricle has hyperdynamic function. The left ventricle has no regional wall motion abnormalities. The left ventricular internal cavity size was normal in size. There is severe asymmetric left ventricular hypertrophy. Right Ventricle: The right ventricular size is normal. Right vetricular wall thickness was not assessed. Right ventricular systolic function is normal. There is mildly elevated pulmonary artery systolic pressure. The tricuspid regurgitant velocity is 2.50 m/s, and with an assumed right atrial pressure of 3 mmHg, the estimated right ventricular systolic pressure is 28.7 mmHg. Left Atrium: Left atrial size was severely dilated. Right Atrium: Right atrial size was normal in size. Pericardium: There is no evidence of pericardial effusion. Mitral Valve: Mild mitral annular calcification. Mild to moderate mitral valve regurgitation. MV peak gradient, 10.1 mmHg. The mean mitral valve gradient is 5.0 mmHg. Tricuspid Valve: The tricuspid valve is normal in structure. Tricuspid valve regurgitation is trivial. Aortic Valve: The aortic valve is tricuspid. Aortic valve regurgitation is not visualized. Mild to moderate aortic valve  sclerosis/calcification is present, without any evidence of aortic stenosis. Aortic valve mean gradient measures 16.3 mmHg.  Aortic valve peak gradient measures 33.3 mmHg. Aortic valve area, by VTI measures 2.39 cm. Pulmonic Valve: The pulmonic valve was not well visualized. Pulmonic valve regurgitation is trivial. Aorta: The aortic root and ascending aorta are structurally normal, with no evidence of dilitation. Venous: The inferior vena cava is normal in size with greater than 50% respiratory variability, suggesting right atrial pressure of 3 mmHg. IAS/Shunts: The interatrial septum was not assessed.  LEFT VENTRICLE PLAX 2D LVIDd:         5.08 cm  Diastology LVIDs:         3.24 cm  LV e' medial:    4.98 cm/s LV PW:         1.29 cm  LV E/e' medial:  32.7 LV IVS:        1.36 cm  LV e' lateral:   12.70 cm/s LVOT diam:     2.00 cm  LV E/e' lateral: 12.8 LV SV:         146 LV SV Index:   73 LVOT Area:     3.14 cm  RIGHT VENTRICLE RV S prime:     35.15 cm/s TAPSE (M-mode): 3.6 cm LEFT ATRIUM              Index       RIGHT ATRIUM           Index LA diam:        5.80 cm  2.88 cm/m  RA Area:     14.20 cm LA Vol (A2C):   78.4 ml  38.94 ml/m RA Volume:   29.90 ml  14.85 ml/m LA Vol (A4C):   118.0 ml 58.60 ml/m LA Biplane Vol: 96.9 ml  48.13 ml/m  AORTIC VALVE AV Area (Vmax):    1.03 cm AV Area (Vmean):   2.45 cm AV Area (VTI):     2.39 cm AV Vmax:           288.33 cm/s AV Vmean:          185.000 cm/s AV VTI:            0.612 m AV Peak Grad:      33.3 mmHg AV Mean Grad:      16.3 mmHg LVOT Vmax:         94.50 cm/s LVOT Vmean:        144.000 cm/s LVOT VTI:          0.466 m LVOT/AV VTI ratio: 0.76  AORTA Ao Root diam: 2.80 cm Ao Asc diam:  2.90 cm MITRAL VALVE                TRICUSPID VALVE MV Area (PHT): 1.90 cm     TR Peak grad:   25.0 mmHg MV Area VTI:   3.48 cm     TR Vmax:        250.00 cm/s MV Peak grad:  10.1 mmHg MV Mean grad:  5.0 mmHg     SHUNTS MV Vmax:       1.59 m/s     Systemic VTI:  0.47 m MV Vmean:       95.8 cm/s    Systemic Diam: 2.00 cm MV Decel Time: 400 msec MV E velocity: 163.00 cm/s MV A velocity: 142.00 cm/s MV E/A ratio:  1.15 Dorris Carnes MD Electronically signed by Dorris Carnes MD Signature Date/Time: 12/23/2020/5:13:12 PM    Final         Scheduled Meds:  [NUU Hold] sodium chloride  Intravenous Once   [MAR Hold] Chlorhexidine Gluconate Cloth  6 each Topical Daily   [MAR Hold] furosemide  20 mg Intravenous Q12H   [MAR Hold] levothyroxine  50 mcg Oral QAC breakfast   [MAR Hold] metoprolol tartrate  12.5 mg Oral BID   pantoprazole  40 mg Oral Daily   [MAR Hold] sodium chloride flush  3 mL Intravenous Q12H   Continuous Infusions:  [MAR Hold] sodium chloride     [MAR Hold] sodium chloride     sodium chloride     lactated ringers 50 mL/hr at 12/24/20 1339     LOS: 2 days    Time spent: 35 minutes    Kirston Luty Darleen Crocker, DO [facility]  If 7PM-7AM, please contact night-coverage [email or web address] 12/24/2020, 2:54 PM

## 2020-12-24 NOTE — Anesthesia Preprocedure Evaluation (Addendum)
Anesthesia Evaluation  Patient identified by MRN, date of birth, ID band Patient awake    Reviewed: Allergy & Precautions, NPO status , Patient's Chart, lab work & pertinent test results  History of Anesthesia Complications Negative for: history of anesthetic complications  Airway Mallampati: III  TM Distance: >3 FB Neck ROM: Full    Dental  (+) Dental Advisory Given   Pulmonary former smoker,    Pulmonary exam normal breath sounds clear to auscultation       Cardiovascular Exercise Tolerance: Poor hypertension, Pt. on medications Normal cardiovascular exam Rhythm:Regular Rate:Normal  23-Dec-2020 09:59:27 [facility]-AP-300 ROUTINE RECORD 1950-10-06 (70 yr) Female Caucasian Room:ICU 5 Loc:903 Technician: Test ind: Vent. rate 86 BPM PR interval 224 ms QRS duration 94 ms QT/QTcB 410/490 ms P-R-T axes 69 21 114 Sinus rhythm with 1st degree A-V block ST & T wave abnormality, consider lateral ischemia Prolonged QT Abnormal ECG No significant change since last tracing Confirmed by Radford Pax, Traci ([id number]) on 12/23/2020 9:20:44 PM   1. Severe thickening of the proximal interventricular septum LVOT is  narrow There is systolic anterior motion of mitral chordae Turbulent flow through LV/LVOT Peak gradkient through LV/LVOT/AV is 90 mm Hg consistent  with HOCM physiology. Left  ventricular ejection fraction, by estimation, is >75%. The left ventricle  has hyperdynamic function. The left ventricle has no regional wall motion  abnormalities. There is severe asymmetric left ventricular hypertrophy.  2. Right ventricular systolic function is normal. The right ventricular  size is normal. There is mildly elevated pulmonary artery systolic  pressure.  3. Left atrial size was severely dilated.  4. Mild to moderate mitral valve regurgitation.  5. The aortic valve is tricuspid. Aortic valve regurgitation is not  visualized. Mild to moderate aortic valve sclerosis/calcification is  present, without any evidence of aortic stenosis.  6. The inferior vena cava is normal in size with greater than 50% respiratory variability, suggesting right atrial pressure of 3 mmHg.    Neuro/Psych negative neurological ROS     GI/Hepatic negative GI ROS, Neg liver ROS,   Endo/Other  Hypothyroidism   Renal/GU negative Renal ROS  negative genitourinary   Musculoskeletal negative musculoskeletal ROS (+)   Abdominal Normal abdominal exam  (+)   Peds negative pediatric ROS (+)  Hematology  (+) anemia ,   Anesthesia Other Findings   Reproductive/Obstetrics negative OB ROS                            Anesthesia Physical Anesthesia Plan  ASA: 4  Anesthesia Plan: General   Post-op Pain Management:    Induction: Intravenous  PONV Risk Score and Plan:   Airway Management Planned: Nasal Cannula and Natural Airway  Additional Equipment:   Intra-op Plan:   Post-operative Plan:   Informed Consent: I have reviewed the patients History and Physical, chart, labs and discussed the procedure including the risks, benefits and alternatives for the proposed anesthesia with the patient or authorized representative who has indicated his/her understanding and acceptance.     Dental advisory given  Plan Discussed with: CRNA and Surgeon  Anesthesia Plan Comments:         Anesthesia Quick Evaluation

## 2020-12-24 NOTE — Op Note (Addendum)
[facility] Patient Name: Nyimah Shadduck Procedure Date: 12/24/2020 1:26 PM MRN: [id number] Date of Birth: 1950-09-21 Attending MD: Maylon Peppers ,  CSN: [id number] Age: 70 Admit Type: Outpatient Procedure:                Upper GI endoscopy Indications:              Melena Providers:                Maylon Peppers, [place] Sharon Seller, RN, Raphael Gibney, Technician Referring MD:              Medicines:                Monitored Anesthesia Care Complications:            No immediate complications. Estimated Blood Loss:     Estimated blood loss: none. Procedure:                Pre-Anesthesia Assessment:                           - Prior to the procedure, a History and Physical                            was performed, and patient medications, allergies                            and sensitivities were reviewed. The patient's                            tolerance of previous anesthesia was reviewed.                           - The risks and benefits of the procedure and the                            sedation options and risks were discussed with the                            patient. All questions were answered and informed                            consent was obtained.                           - ASA Grade Assessment: III - A patient with severe                            systemic disease.                           After obtaining informed consent, the endoscope was                            passed under direct vision. Throughout the  procedure, the patient's blood pressure, pulse, and                            oxygen saturations were monitored continuously. The                            [id number]) was introduced through the mouth,                            and advanced to the second part of duodenum. The                            upper GI endoscopy was accomplished without                             difficulty. The patient tolerated the procedure                            well. Scope In: 1:48:42 PM Scope Out: 1:59:16 PM Total Procedure Duration: 0 hours 10 minutes 34 seconds  Findings:      The examined esophagus was normal. No presence of varices.      The entire examined stomach was normal. Upon careful inspection, no       presence of hematin or active bleeding was present.      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      The rest of the examined duodenum was normal. Upon careful inspection,       no presence of hematin or active bleeding was present. A capsule       endoscopy was deployed in the duodenal bulb, Impression:               - Normal esophagus.                           - Normal stomach.                           - Mucosal nodule found in the duodenum - possible                            gastric heterotopia?Marland Kitchen Biopsied.                           - Normal examined duodenum. Moderate Sedation:      Per Anesthesia Care Recommendation:           - Return patient to hospital ward.                           - Clear liquid diet today.                           - Check H/H every 12 hours.                           -  Follow capsule endoscopy reading.                           - Stop PPI drip.                           - Follow up path results. Procedure Code(s):        --- Professional ---                           [phone], Esophagogastroduodenoscopy, flexible,                            transoral; with biopsy, single or multiple Diagnosis Code(s):        --- Professional ---                           K31.89, Other diseases of stomach and duodenum                           K92.1, Melena (includes Hematochezia) CPT copyright 2019 American Medical Association. All rights reserved. The codes documented in this report are  preliminary and upon coder review may  be revised to meet current compliance requirements. Maylon Peppers, MD Maylon Peppers,  12/24/2020 2:06:15 PM This report has been signed electronically. Number of Addenda: 0

## 2020-12-25 DIAGNOSIS — R778 Other specified abnormalities of plasma proteins: Secondary | ICD-10-CM | POA: Diagnosis not present

## 2020-12-25 LAB — BASIC METABOLIC PANEL
Anion gap: 6 (ref 5–15)
BUN: 32 mg/dL — ABNORMAL HIGH (ref 8–23)
CO2: 27 mmol/L (ref 22–32)
Calcium: 8 mg/dL — ABNORMAL LOW (ref 8.9–10.3)
Chloride: 109 mmol/L (ref 98–111)
Creatinine, Ser: 1.07 mg/dL — ABNORMAL HIGH (ref 0.44–1.00)
GFR, Estimated: 56 mL/min — ABNORMAL LOW (ref >60)
Glucose, Bld: 130 mg/dL — ABNORMAL HIGH (ref 70–99)
Potassium: 3.7 mmol/L (ref 3.5–5.1)
Sodium: 142 mmol/L (ref 135–145)

## 2020-12-25 LAB — CBC
HCT: 25.5 % — ABNORMAL LOW (ref 36.0–46.0)
Hemoglobin: 7.6 g/dL — ABNORMAL LOW (ref 12.0–15.0)
MCH: 27 pg (ref 26.0–34.0)
MCHC: 29.8 g/dL — ABNORMAL LOW (ref 30.0–36.0)
MCV: 90.7 fL (ref 80.0–100.0)
Platelets: 269 10*3/uL (ref 150–400)
RBC: 2.81 MIL/uL — ABNORMAL LOW (ref 3.87–5.11)
RDW: 17.1 % — ABNORMAL HIGH (ref 11.5–15.5)
WBC: 11.1 10*3/uL — ABNORMAL HIGH (ref 4.0–10.5)
nRBC: 0.4 % — ABNORMAL HIGH (ref 0.0–0.2)

## 2020-12-25 LAB — TYPE AND SCREEN
ABO/RH(D): O POS
Antibody Screen: NEGATIVE
Unit division: 0
Unit division: 0
Unit division: 0

## 2020-12-25 LAB — BPAM RBC
Blood Product Expiration Date: 202206272359
Blood Product Expiration Date: 202207212359
Blood Product Expiration Date: 202207272359
ISSUE DATE / TIME: 202206191108
ISSUE DATE / TIME: 202206191457
ISSUE DATE / TIME: 202206211035
Unit Type and Rh: 5100
Unit Type and Rh: 5100
Unit Type and Rh: 9500

## 2020-12-25 LAB — IRON AND TIBC
Iron: 19 ug/dL — ABNORMAL LOW (ref 28–170)
Saturation Ratios: 6 % — ABNORMAL LOW (ref 10.4–31.8)
TIBC: 341 ug/dL (ref 250–450)
UIBC: 322 ug/dL

## 2020-12-25 LAB — HEMOGLOBIN AND HEMATOCRIT, BLOOD
HCT: 26.8 % — ABNORMAL LOW (ref 36.0–46.0)
HCT: 26.8 % — ABNORMAL LOW (ref 36.0–46.0)
Hemoglobin: 8.1 g/dL — ABNORMAL LOW (ref 12.0–15.0)
Hemoglobin: 7.9 g/dL — ABNORMAL LOW (ref 12.0–15.0)

## 2020-12-25 LAB — FERRITIN: Ferritin: 10 ng/mL — ABNORMAL LOW (ref 11–307)

## 2020-12-25 LAB — MAGNESIUM: Magnesium: 2.2 mg/dL (ref 1.7–2.4)

## 2020-12-25 NOTE — Progress Notes (Signed)
Subjective:  Reports loose stool earlier today.  She has not passed any blood per rectum or tarry stool.  She still complains of weakness and gets short of breath on taking few steps.  The symptoms started few weeks ago.  She is hungry.  She is vegetarian for health reasons.  She is all right to try heart healthy diet for few days.  Current Medications:  Current Facility-Administered Medications:    0.9 %  sodium chloride infusion (Manually program via Guardrails IV Fluids), , Intravenous, Once, Heath Lark D, DO, Held at 12/24/20 0941   0.9 %  sodium chloride infusion, 10 mL/hr, Intravenous, Once, Manuella Ghazi, Pratik D, DO   0.9 %  sodium chloride infusion, 250 mL, Intravenous, PRN, Manuella Ghazi, Pratik D, DO   acetaminophen (TYLENOL) tablet 650 mg, 650 mg, Oral, Q6H PRN **OR** acetaminophen (TYLENOL) suppository 650 mg, 650 mg, Rectal, Q6H PRN, Manuella Ghazi, Pratik D, DO   albuterol (VENTOLIN HFA) 108 (90 Base) MCG/ACT inhaler 1-2 puff, 1-2 puff, Inhalation, Q4H PRN, Manuella Ghazi, Pratik D, DO   Chlorhexidine Gluconate Cloth 2 % PADS 6 each, 6 each, Topical, Daily, Manuella Ghazi, Pratik D, DO, 6 each at 12/25/20 1042   fluticasone (FLONASE) 50 MCG/ACT nasal spray 2 spray, 2 spray, Each Nare, Daily PRN, Manuella Ghazi, Pratik D, DO   furosemide (LASIX) injection 20 mg, 20 mg, Intravenous, Q12H, Shah, Pratik D, DO, 20 mg at 12/25/20 1727   levothyroxine (SYNTHROID) tablet 50 mcg, 50 mcg, Oral, QAC breakfast, Manuella Ghazi, Pratik D, DO, 50 mcg at 12/25/20 1610   metoprolol tartrate (LOPRESSOR) tablet 12.5 mg, 12.5 mg, Oral, BID, Arnoldo Lenis, MD, 12.5 mg at 12/25/20 1042   ondansetron (ZOFRAN) tablet 4 mg, 4 mg, Oral, Q6H PRN **OR** ondansetron (ZOFRAN) injection 4 mg, 4 mg, Intravenous, Q6H PRN, Manuella Ghazi, Pratik D, DO   pantoprazole (PROTONIX) EC tablet 40 mg, 40 mg, Oral, Daily, Montez Morita, Daniel, MD, 40 mg at 12/25/20 1042   sodium chloride flush (NS) 0.9 % injection 3 mL, 3 mL, Intravenous, Q12H, Shah, Pratik D, DO, 3 mL at 12/25/20  1043   sodium chloride flush (NS) 0.9 % injection 3 mL, 3 mL, Intravenous, PRN, Manuella Ghazi, Pratik D, DO   Objective: Blood pressure (!) 106/47, pulse 64, temperature 97.7 F (36.5 C), temperature source Axillary, resp. rate 14, height _0  (1.575 m), weight 102.5 kg, SpO2 95 %. Patient is alert and in no acute distress. Abdomen is full but soft and nontender with organomegaly or masses. She does not have peripheral edema.  Labs/studies Results:   CBC Latest Ref Rng & Units 12/25/2020 12/25/2020 12/25/2020  WBC 4.0 - 10.5 K/uL - - 11.1(H)  Hemoglobin 12.0 - 15.0 g/dL 7.9(L) 8.1(L) 7.6(L)  Hematocrit 36.0 - 46.0 % 26.8(L) 26.8(L) 25.5(L)  Platelets 150 - 400 K/uL - - 269    CMP Latest Ref Rng & Units 12/25/2020 12/24/2020 12/23/2020  Glucose 70 - 99 mg/dL 130(H) 133(H) 143(H)  BUN 8 - 23 mg/dL 32(H) 29(H) 38(H)  Creatinine 0.44 - 1.00 mg/dL 1.07(H) 0.93 1.07(H)  Sodium 135 - 145 mmol/L 142 142 141  Potassium 3.5 - 5.1 mmol/L 3.7 4.0 4.2  Chloride 98 - 111 mmol/L 109 114(H) 113(H)  CO2 22 - 32 mmol/L _1 Calcium 8.9 - 10.3 mg/dL 8.0(L) 8.0(L) 8.0(L)  Total Protein 6.5 - 8.1 g/dL - - 6.0(L)  Total Bilirubin 0.3 - 1.2 mg/dL - - 1.1  Alkaline Phos 38 - 126 U/L - - 47  AST 15 -  41 U/L - - 18  ALT 0 - 44 U/L - - 13    Hepatic Function Latest Ref Rng & Units 12/23/2020 12/22/2020 05/13/2013  Total Protein 6.5 - 8.1 g/dL 6.0(L) 6.5 8.2  Albumin 3.5 - 5.0 g/dL 3.3(L) 3.6 4.2  AST 15 - 41 U/L _0 ALT 0 - 44 U/L 13 12 42(H)  Alk Phosphatase 38 - 126 U/L 47 45 64  Total Bilirubin 0.3 - 1.2 mg/dL 1.1 0.4 0.3  Bilirubin, Direct 0.0 - 0.3 mg/dL - - -    Duodenal biopsy pending.  Assessment:  #1.  GI bleed/melena.  Patient underwent EGD yesterday no bleeding source was identified.  Duodenal biopsies are pending.  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  Small bowel given capsule study reviewed and she does have AV malformations and few  erosions but without active bleeding.  AVMs may be potential source of GI blood loss.  I do not feel that there would be reachable on push enteroscopy and she would be best served by going to tertiary center for single or double-balloon enteroscopy.  #2.  Anemia secondary to GI bleed.  Patient has received 3 units of PRBCs.  Hemoglobin is low but stable.  #3.  Elevated troponin levels felt to be demand ischemia in setting of anemia.  #4.  Exertional dyspnea appears to be multifactorial.  She has a history of septal hypertrophy with narrow LVOT which may also be contributing to her exertional dyspnea secondary to diminished cardiac output.  Cardiology has recommended outpatient MRI.   Recommendations  Diet advanced to heart healthy. CBC in AM with serum iron TIBC ferritin and B12 levels.

## 2020-12-25 NOTE — Op Note (Signed)
Small Bowel Givens Capsule Study Procedure date:  12/24/20  Referring Provider:  Dr. Jenetta Downer PCP:  Dr. Nevada Crane, Edwinna Areola, MD  Indication for procedure: Acute blood loss anemia, melena  Patient data:  Wt: 102.5 kg Ht: 5'2"  Findings: Hyperemic, nodular mucosa on first duodenal image with surrounding heme likely secondary to biopsy of this area just prior to placement of Givens capsule.  Few lymphangiectasia's (00: 24: 31, 00: 24: 42, 00: 29: 28).  Multiple scattered punctate AVMs with larger AVM noted at 01: 22: 81.  Few scattered erosions.  Distal small bowel difficult to evaluate due to small bowel contents/dark effluent.  No appreciable masses or small bowel polyps.  Study was complete to the cecum.  First Gastric image:  N/A (capsule placed in duodenum via EGD) First Duodenal image: 00: 02: 42 First Cecal image: 05: 05: 33 Small Bowel Passage time:  5h 28m  Summary & Recommendations: Findings detailed above with abnormal proximal duodenum that was biopsied at the time of EGD, multiple scattered punctate AVMs and a larger AVM, few scattered erosions, and dark a fluent occluding most of the view of the distal small bowel.  Melena and acute blood loss anemia likely from multiple AVMs/erosions.  Patient has denied NSAIDs though query accuracy of this.  Ultimately, patient would likely benefit from double-balloon enteroscopy for complete evaluation and therapeutic intervention.  Encouragingly, her hemoglobin has remained fairly stable today though she did report to melanotic stools this morning.  Recommend monitoring H&H and for ongoing overt GI bleeding.  If patient remains stable over the next 24 hours, could consider discharge with referral to tertiary care center for double-balloon enteroscopy ASAP.  In the interim, could also consider subcu octreotide for small bowel AVMs.  We will need to discuss with Dr. Laural Golden.  Recommend continuing PPI daily.  Strict NSAID avoidance.  Check iron  panel.     Aliene Altes, PA-C [facility] 12/25/2020

## 2020-12-25 NOTE — Progress Notes (Addendum)
Progress Note  Patient Name: PAXTON KANAAN Date of Encounter: 12/25/2020  [facility] Cardiologist: Carlyle Dolly, MD   Subjective   No chest pain or palpitations. Remains on supplemental oxygen this AM.   Inpatient Medications    Scheduled Meds:  sodium chloride   Intravenous Once   Chlorhexidine Gluconate Cloth  6 each Topical Daily   furosemide  20 mg Intravenous Q12H   levothyroxine  50 mcg Oral QAC breakfast   metoprolol tartrate  12.5 mg Oral BID   pantoprazole  40 mg Oral Daily   sodium chloride flush  3 mL Intravenous Q12H   Continuous Infusions:  sodium chloride     sodium chloride     PRN Meds: sodium chloride, acetaminophen **OR** acetaminophen, albuterol, fluticasone, ondansetron **OR** ondansetron (ZOFRAN) IV, sodium chloride flush   Vital Signs    Vitals:   12/25/20 0600 12/25/20 0700 12/25/20 0702 12/25/20 0800  BP: (!) 116/50  (!) 100/46 113/66  Pulse: 66 64 67 64  Resp: (!) 21 (!) 27 (!) 21 20  Temp:   98.5 F (36.9 C)   TempSrc:   Oral   SpO2: 95% 94% 94% 94%  Weight:      Height:        Intake/Output Summary (Last 24 hours) at 12/25/2020 0938 Last data filed at 12/24/2020 1800 Gross per 24 hour  Intake 1394.39 ml  Output 1 ml  Net 1393.39 ml   Last 3 Weights 12/25/2020 12/24/2020 12/22/2020  Weight (lbs) 225 lb 15.5 oz 226 lb 6.6 oz 225 lb 15.5 oz  Weight (kg) 102.5 kg 102.7 kg 102.5 kg      Telemetry    NSR, HR in 60's to 80's with occasional episodes of sinus bradycardia and HR in the 50's.  - Personally Reviewed  ECG   No new tracings.   Physical Exam   GEN: Appears in no acute distress.   Neck: No JVD Cardiac: RRR,  2/6 SEM along RUSB.  Respiratory: Clear to auscultation bilaterally. GI: Soft, nontender, non-distended  MS: No edema; No deformity. Neuro:  Nonfocal  Psych: Normal affect   Labs    High Sensitivity Troponin:   Recent Labs  Lab 12/22/20 1451 12/23/20 0811 12/23/20 1007 12/24/20 0644  12/24/20 1023  TROPONINIHS 112* 736* 741* 447* 458*      Chemistry Recent Labs  Lab 12/22/20 0843 12/22/20 0852 12/23/20 0438 12/24/20 0644 12/25/20 0331  NA 140   < > 141 142 142  K 3.8   < > 4.2 4.0 3.7  CL 112*   < > 113* 114* 109  CO2 19*  --  22 23 27   GLUCOSE 143*   < > 143* 133* 130*  BUN 59*   < > 38* 29* 32*  CREATININE 1.00   < > 1.07* 0.93 1.07*  CALCIUM 8.1*  --  8.0* 8.0* 8.0*  PROT 6.5  --  6.0*  --   --   ALBUMIN 3.6  --  3.3*  --   --   AST 17  --  18  --   --   ALT 12  --  13  --   --   ALKPHOS 45  --  47  --   --   BILITOT 0.4  --  1.1  --   --   GFRNONAA >60  --  56* >60 56*  ANIONGAP 9  --  6 5 6    < > = values in this interval not  displayed.     Hematology Recent Labs  Lab 12/23/20 0438 12/24/20 0644 12/24/20 1632 12/25/20 0331  WBC 15.5* 13.5*  --  11.1*  RBC 3.07* 2.69*  --  2.81*  HGB 8.1* 7.2* 7.9* 7.6*  HCT 28.5* 24.2* 26.3* 25.5*  MCV 92.8 90.0  --  90.7  MCH 26.4 26.8  --  27.0  MCHC 28.4* 29.8*  --  29.8*  RDW 16.9* 17.6*  --  17.1*  PLT 303 286  --  269    BNP Recent Labs  Lab 12/22/20 0843 12/23/20 0439  BNP 133.0* 393.0*     DDimer  Recent Labs  Lab 12/23/20 0850  DDIMER 0.35     Radiology    DG Chest 1 View  Result Date: 12/23/2020 CLINICAL DATA:  Respiratory failure EXAM: CHEST  1 VIEW COMPARISON:  Film from the previous day. FINDINGS: Cardiac shadow is stable. Lungs are well aerated bilaterally. Mild interstitial edema is noted. No sizable effusion is noted. No bony abnormality is seen. IMPRESSION: New onset mild interstitial edema. Electronically Signed   By: Inez Catalina M.D.   On: 12/23/2020 15:01   US ABDOMEN COMPLETE W/ELASTOGRAPHY  Result Date: 12/23/2020 CLINICAL DATA:  Nonalcoholic fatty liver EXAM: ULTRASOUND ABDOMEN ULTRASOUND HEPATIC ELASTOGRAPHY TECHNIQUE: Sonography of the upper abdomen was performed. In addition, ultrasound elastography evaluation of the liver was performed. A region of interest  was placed within the right lobe of the liver. Following application of a compressive sonographic pulse, tissue compressibility was assessed. Multiple assessments were performed at the selected site. Median tissue compressibility was determined. Previously, hepatic stiffness was assessed by shear wave velocity. Based on recently published Society of Radiologists in Ultrasound consensus article, reporting is now recommended to be performed in the SI units of pressure (kiloPascals) representing hepatic stiffness/elasticity. The obtained result is compared to the published reference standards. (cACLD = compensated Advanced Chronic Liver Disease) COMPARISON:  None. FINDINGS: ULTRASOUND ABDOMEN Gallbladder: Status post cholecystectomy. Common bile duct: Diameter: 5 mm Liver: No focal lesion identified. Increased parenchymal echogenicity. Portal vein is patent on color Doppler imaging with normal direction of blood flow towards the liver. IVC: No abnormality visualized. Pancreas: Obscured by overlying bowel gas. Spleen: Size and appearance within normal limits. Right Kidney: Length: 15.0 cm. Enlarged by multiple cysts, some of which are thinly septated. No solid mass or hydronephrosis visualized. Left Kidney: Length: 11.8 cm. Echogenicity within normal limits. No mass or hydronephrosis visualized. Abdominal aorta: No aneurysm visualized. Other findings: None. ULTRASOUND HEPATIC ELASTOGRAPHY Device: Siemens Helix VTQ Patient position: Oblique Transducer 5C1 Number of measurements: 15 Hepatic segment:  8 Median kPa: 2.1 IQR: 0.1 IQR/Median kPa ratio: 0.05 Data quality:  Good Diagnostic category:  < or = 5 kPa: high probability of being normal The use of hepatic elastography is applicable to patients with viral hepatitis and non-alcoholic fatty liver disease. At this time, there is insufficient data for the referenced cut-off values and use in other causes of liver disease, including alcoholic liver disease. Patients,  however, may be assessed by elastography and serve as their own reference standard/baseline. In patients with non-alcoholic liver disease, the values suggesting compensated advanced chronic liver disease (cACLD) may be lower, and patients may need additional testing with elasticity results of 7-9 kPa. Please note that abnormal hepatic elasticity and shear wave velocities may also be identified in clinical settings other than with hepatic fibrosis, such as: acute hepatitis, elevated right heart and central venous pressures including use of beta blockers, veno-occlusive disease (  Budd-Chiari), infiltrative processes such as mastocytosis/amyloidosis/infiltrative tumor/lymphoma, extrahepatic cholestasis, with hyperemia in the post-prandial state, and with liver transplantation. Correlation with patient history, laboratory data, and clinical condition recommended. Diagnostic Categories: < or =5 kPa: high probability of being normal < or =9 kPa: in the absence of other known clinical signs, rules out cACLD >9 kPa and ?13 kPa: suggestive of cACLD, but needs further testing >13 kPa: highly suggestive of cACLD > or =17 kPa: highly suggestive of cACLD with an increased probability of clinically significant portal hypertension IMPRESSION: ULTRASOUND ABDOMEN: 1.  Hepatic steatosis. 2.  Status post cholecystectomy. ULTRASOUND HEPATIC ELASTOGRAPHY: Median kPa:  2.1 Diagnostic category:  < or = 5 kPa: high probability of being normal Electronically Signed   By: Eddie Candle M.D.   On: 12/23/2020 11:35   Cardiac Studies   Echocardiogram: 12/2020 IMPRESSIONS     1. Severe thickening of the proximal interventricular septum LVOT is  narrow There is systolic anterior motion of mitral chordae Turbulent flow  through LV/LVOT Peak gradkient through LV/LVOT/AV is 90 mm Hg consistent  with HOCM physiology. Left  ventricular ejection fraction, by estimation, is >75%. The left ventricle  has hyperdynamic function. The left  ventricle has no regional wall motion  abnormalities. There is severe asymmetric left ventricular hypertrophy.   2. Right ventricular systolic function is normal. The right ventricular  size is normal. There is mildly elevated pulmonary artery systolic  pressure.   3. Left atrial size was severely dilated.   4. Mild to moderate mitral valve regurgitation.   5. The aortic valve is tricuspid. Aortic valve regurgitation is not  visualized. Mild to moderate aortic valve sclerosis/calcification is  present, without any evidence of aortic stenosis.   6. The inferior vena cava is normal in size with greater than 50%  respiratory variability, suggesting right atrial pressure of 3 mmHg.   Patient Profile     70 y.o. female w/ PMH of HTN, HLD, HFpEF and Hypothyroidism who is currently admitted for anemia. Cardiology consulted due to elevated troponin values.   Assessment & Plan    1. Elevated Troponin Values/Abnormal EKG - Hs Troponin values peaked at 741 and EKG shows ST depression along the lateral leads. Repeat echo shows a preserved EF of > 75% with turbulent flow through the LVOT consistent with HOCM.  - Enzyme elevation felt to be secondary to demand ischemia in the setting of her anemia, hypoxia and hyperdynamic LV. No plans for ischemic testing at this time.   2. Dynamic LVOT Gradient - Images previously reviewed by Dr. Harl Bowie and felt to be most consistent with septal hypertrophy with narrow LVOT. Recommended to keep Hgb at 8-9 and avoid hypovolemia. She has been started on Lopressor 12.5mg  BID and would not further titrate at this time given her soft BP (at 91/36 - 129/96 within the past 24 hours) and due to her HR being in the 50's at times.  - Outpatient Cardiac MRI recommended for further evaluation.   3. Anemia/GIB - Hgb at 6.3 on admission and received 3 units pRBC's. Hgb at 7.6 this AM. Would try to keep > 8 as outlined above. GI following and underwent EGD yesterday which showed  normal esophagus and stomach with mucosal nodule along the duodenum which was biopsied. Further management per GI and the admitting team.     For questions or updates, please contact [place] Please consult [email or web address] for contact info under   Patient examined chart reviewed Discussed care with patient Etiology of  GI bleed still not clear EGD with just gastric heterotopia Still needs colonoscopy Plan for outpatient w/u Lexiscan myovue and possibly MRI for HOCM although I suspect gradients / LVOT will improve as anemia improves   Jenkins Rouge MD [facility]

## 2020-12-25 NOTE — Progress Notes (Signed)
PROGRESS NOTE    [name]  ZOX:[id number] DOB: Oct 15, 1950 DOA: 12/22/2020 PCP: Celene Squibb, MD   Brief Narrative:   Bethany Cunningham is a 70 y.o. female with medical history significant for morbid obesity, hypertension, dyslipidemia, and hypothyroidism who presented to the ED with complaints of black, tarry stools that began on 6/18.  She was admitted with acute blood loss anemia suspected secondary to upper GI bleed and had been started on PPI infusion as well as octreotide.  She has been transfused 2 units of PRBCs with stability overnight.  Troponins had elevated and she was noted to be more hypoxemic and therefore cardiology was consulted for evaluation.  This appears to be related to demand ischemia in the setting of her severe anemia.  She is noted to be mildly volume overloaded and Lasix has been initiated for diuresis.  Patient has undergone upper endoscopy on 6/21 with no significant findings of bleeding.  Small capsule study has been initiated.  Plans are to monitor repeat H&H every 12 hours and maintain on oral PPI.  Diet has been advanced to clear liquid.  GI following.  Assessment & Plan:   Active Problems:   Fatty liver disease, nonalcoholic   Acute blood loss anemia   Upper GI bleed   Acute blood loss anemia suspected secondary to upper GI bleed-stable -Status post EGD 6/21 with no acute findings,  - Capsule study -- pending results -Status post 3 unit PRBCs -Hemoglobin 8.1, 7.2, 7.9, 7.6 now  -Clear liquid diet  -GI consult with plans for upper endoscopy I -Remote use of Goody powders -Monitor repeat H/H BID -Protonix infusion, now changed to oral Protonix   Acute hypoxemic respiratory failure with dyspnea on exertion/near syncope -Related to some pulmonary edema -Stable now on 2 L of oxygen, satting 96% -Appears to have some signs of volume overload on chest x-ray and with noted elevated BNP -IV Lasix 20 mg twice daily ordered with improvement in  dyspnea, continue in the setting of dynamic LVOT gradient -2D echocardiogram with dynamic LVOT gradient, will require cardiac MRI in the outpatient setting to better evaluate the degree and distribution of hypertrophy per cardiology   Troponin elevation -Appears to be demand ischemia, appreciate ongoing cardiology evaluation -2D echocardiogram with no signs of wall motion abnormalities, but dynamic LVOT gradient -Cardiology following   History of hypertension -Currently hypotensive, holding home medications -Blood pressure fairly stable now    Dyslipidemia -Holding statin and fish oils   History of hypothyroidism -Continue levothyroxine   Morbid obesity -Lifestyle changes outpatient     DVT prophylaxis:SCDs Code Status: Full Family Communication:None at bedside  Disposition Plan:  Status is: Inpatient   Remains inpatient appropriate because:Ongoing diagnostic testing needed not appropriate for outpatient work up, IV treatments appropriate due to intensity of illness or inability to take PO, and Inpatient level of care appropriate due to severity of illness Monitoring for symptomatic anemia, GI bleed elevated troponin   Dispo: The patient is from: Home              Anticipated d/c is to: Home              Patient currently is not medically stable to d/c.              Difficult to place patient No     Consultants:  GI Cardio   Procedures:  See below   Antimicrobials:  None  Subjective: Patient seen and evaluated today with no new  acute complaints or concerns. No acute concerns or events noted overnight.  Continues to require high amounts of oxygen.  Objective: Vitals:   12/25/20 0700 12/25/20 0702 12/25/20 0800 12/25/20 1121  BP:  (!) 100/46 113/66   Pulse: 64 67 64 76  Resp: (!) 27 (!) 21 20 15   Temp:  98.5 F (36.9 C)  98.1 F (36.7 C)  TempSrc:  Oral  Oral  SpO2: 94% 94% 94% 96%  Weight:      Height:        Intake/Output Summary (Last 24 hours) at  12/25/2020 1237 Last data filed at 12/24/2020 1800 Gross per 24 hour  Intake 1394.39 ml  Output --  Net 1394.39 ml   Filed Weights   12/22/20 1601 12/24/20 0445 12/25/20 0505  Weight: 102.5 kg 102.7 kg 102.5 kg      Physical Exam:   General:  Alert, oriented, cooperative, no distress;   HEENT:  Normocephalic, PERRL, otherwise with in Normal limits   Neuro:  CNII-XII intact. , normal motor and sensation, reflexes intact   Lungs:   Clear to auscultation BL, Respirations unlabored, no wheezes / crackles  Cardio:    S1/S2, RRR, No murmure, No Rubs or Gallops   Abdomen:   Soft, non-tender, bowel sounds active all four quadrants,  no guarding or peritoneal signs.  Muscular skeletal:  Limited exam - in bed, able to move all 4 extremities, Normal strength,  2+ pulses,  symmetric, No pitting edema  Skin:  Dry, warm to touch, negative for any Rashes,  Wounds: Please see nursing documentation        .    Data Reviewed: I have personally reviewed following labs and imaging studies  CBC: Recent Labs  Lab 12/22/20 0843 12/22/20 0852 12/23/20 0438 12/24/20 0644 12/24/20 1632 12/25/20 0331  WBC 16.4*  --  15.5* 13.5*  --  11.1*  NEUTROABS 13.8*  --   --   --   --   --   HGB 6.3* 6.5* 8.1* 7.2* 7.9* 7.6*  HCT 21.0* 19.0* 28.5* 24.2* 26.3* 25.5*  MCV 86.4  --  92.8 90.0  --  90.7  PLT 337  --  303 286  --  680   Basic Metabolic Panel: Recent Labs  Lab 12/22/20 0843 12/22/20 0852 12/22/20 1451 12/23/20 0438 12/24/20 0644 12/25/20 0331  NA 140 143  --  141 142 142  K 3.8 3.8  --  4.2 4.0 3.7  CL 112* 112*  --  113* 114* 109  CO2 19*  --   --  22 23 27   GLUCOSE 143* 138*  --  143* 133* 130*  BUN 59* 60*  --  38* 29* 32*  CREATININE 1.00 0.90  --  1.07* 0.93 1.07*  CALCIUM 8.1*  --   --  8.0* 8.0* 8.0*  MG  --   --  2.2  --  2.3 2.2   GFR: Estimated Creatinine Clearance: 54.1 mL/min (A) (by C-G formula based on SCr of 1.07 mg/dL (H)). Liver Function Tests: Recent  Labs  Lab 12/22/20 0843 12/23/20 0438  AST 17 18  ALT 12 13  ALKPHOS 45 47  BILITOT 0.4 1.1  PROT 6.5 6.0*  ALBUMIN 3.6 3.3*   No results for input(s): LIPASE, AMYLASE in the last 168 hours. No results for input(s): AMMONIA in the last 168 hours. Coagulation Profile: Recent Labs  Lab 12/22/20 1451  INR 1.1    Sepsis Labs: Recent Labs  Lab 12/23/20 0850  PROCALCITON <0.10    Recent Results (from the past 240 hour(s))  Resp Panel by RT-PCR (Flu A&B, Covid) Nasopharyngeal Swab     Status: None   Collection Time: 12/22/20  9:15 AM   Specimen: Nasopharyngeal Swab; Nasopharyngeal(NP) swabs in vial transport medium  Result Value Ref Range Status   SARS Coronavirus 2 by RT PCR NEGATIVE NEGATIVE Final    Comment: (NOTE) SARS-CoV-2 target nucleic acids are NOT DETECTED.  The SARS-CoV-2 RNA is generally detectable in upper respiratory specimens during the acute phase of infection. The lowest concentration of SARS-CoV-2 viral copies this assay can detect is 138 copies/mL. A negative result does not preclude SARS-Cov-2 infection and should not be used as the sole basis for treatment or other patient management decisions. A negative result may occur with  improper specimen collection/handling, submission of specimen other than nasopharyngeal swab, presence of viral mutation(s) within the areas targeted by this assay, and inadequate number of viral copies(<138 copies/mL). A negative result must be combined with clinical observations, patient history, and epidemiological information. The expected result is Negative.  Fact Sheet for Patients:  EntrepreneurPulse.com.au  Fact Sheet for Healthcare Providers:  IncredibleEmployment.be  This test is no t yet approved or cleared by the [place] FDA and  has been authorized for detection and/or diagnosis of SARS-CoV-2 by FDA under an Emergency Use Authorization (EUA). This EUA will remain  in  effect (meaning this test can be used) for the duration of the COVID-19 declaration under Section 564(b)(1) of the Act, 21 U.S.C.section 360bbb-3(b)(1), unless the authorization is terminated  or revoked sooner.       Influenza A by PCR NEGATIVE NEGATIVE Final   Influenza B by PCR NEGATIVE NEGATIVE Final    Comment: (NOTE) The Xpert Xpress SARS-CoV-2/FLU/RSV plus assay is intended as an aid in the diagnosis of influenza from Nasopharyngeal swab specimens and should not be used as a sole basis for treatment. Nasal washings and aspirates are unacceptable for Xpert Xpress SARS-CoV-2/FLU/RSV testing.  Fact Sheet for Patients: EntrepreneurPulse.com.au  Fact Sheet for Healthcare Providers: IncredibleEmployment.be  This test is not yet approved or cleared by the [place] FDA and has been authorized for detection and/or diagnosis of SARS-CoV-2 by FDA under an Emergency Use Authorization (EUA). This EUA will remain in effect (meaning this test can be used) for the duration of the COVID-19 declaration under Section 564(b)(1) of the Act, 21 U.S.C. section 360bbb-3(b)(1), unless the authorization is terminated or revoked.  Performed at [facility], [address], [place], [place] [address]   MRSA Next Gen by PCR, Nasal     Status: None   Collection Time: 12/22/20  5:00 PM   Specimen: Nasal Mucosa; Nasal Swab  Result Value Ref Range Status   MRSA by PCR Next Gen NOT DETECTED NOT DETECTED Final    Comment: (NOTE) The GeneXpert MRSA Assay (FDA approved for NASAL specimens only), is one component of a comprehensive MRSA colonization surveillance program. It is not intended to diagnose MRSA infection nor to guide or monitor treatment for MRSA infections. Test performance is not FDA approved in patients less than 61 years old. Performed at [facility], [address], [place],  [address]          Radiology Studies: DG Chest 1  View  Result Date: 12/23/2020 CLINICAL DATA:  Respiratory failure EXAM: CHEST  1 VIEW COMPARISON:  Film from the previous day. FINDINGS: Cardiac shadow is stable. Lungs are well aerated bilaterally. Mild interstitial edema is noted.  No sizable effusion is noted. No bony abnormality is seen. IMPRESSION: New onset mild interstitial edema. Electronically Signed   By: Inez Catalina M.D.   On: 12/23/2020 15:01   ECHOCARDIOGRAM COMPLETE  Result Date: 12/23/2020    ECHOCARDIOGRAM REPORT   Patient Name:   Bethany Cunningham Date of Exam: 12/23/2020 Medical Rec #:  [id number]          Height:       62.0 in Accession #:    [id number]         Weight:       226.0 lb Date of Birth:  1951/01/27          BSA:          2.014 m Patient Age:    89 years           BP:           101/41 mmHg Patient Gender: F                  HR:           85 bpm. Exam Location:  [facility] Procedure: 2D Echo, Cardiac Doppler and Color Doppler Indications:    Elevated Troponin  History:        Patient has prior history of Echocardiogram examinations, most                 recent 01/19/2018. Risk Factors:Hypertension and Dyslipidemia.  Sonographer:    Wenda Low Referring Phys: [id number] Linn D Tryon  1. Severe thickening of the proximal interventricular septum LVOT is narrow There is systolic anterior motion of mitral chordae Turbulent flow through LV/LVOT Peak gradkient through LV/LVOT/AV is 90 mm Hg consistent with HOCM physiology. Left ventricular ejection fraction, by estimation, is >75%. The left ventricle has hyperdynamic function. The left ventricle has no regional wall motion abnormalities. There is severe asymmetric left ventricular hypertrophy.  2. Right ventricular systolic function is normal. The right ventricular size is normal. There is mildly elevated pulmonary artery systolic pressure.  3. Left atrial size was severely dilated.  4. Mild to moderate mitral valve regurgitation.  5. The aortic valve is tricuspid. Aortic  valve regurgitation is not visualized. Mild to moderate aortic valve sclerosis/calcification is present, without any evidence of aortic stenosis.  6. The inferior vena cava is normal in size with greater than 50% respiratory variability, suggesting right atrial pressure of 3 mmHg. FINDINGS  Left Ventricle: Severe thickening of the proximal interventricular septum LVOT is narrow There is systolic anterior motion of mitral chordae Turbulent flow through LV/LVOT Peak gradkient through LV/LVOT/AV is 90 mm Hg. Consistent with IHSS. Left ventricular ejection fraction, by estimation, is >75%. The left ventricle has hyperdynamic function. The left ventricle has no regional wall motion abnormalities. The left ventricular internal cavity size was normal in size. There is severe asymmetric left ventricular hypertrophy. Right Ventricle: The right ventricular size is normal. Right vetricular wall thickness was not assessed. Right ventricular systolic function is normal. There is mildly elevated pulmonary artery systolic pressure. The tricuspid regurgitant velocity is 2.50 m/s, and with an assumed right atrial pressure of 3 mmHg, the estimated right ventricular systolic pressure is 82.4 mmHg. Left Atrium: Left atrial size was severely dilated. Right Atrium: Right atrial size was normal in size. Pericardium: There is no evidence of pericardial effusion. Mitral Valve: Mild mitral annular calcification. Mild to moderate mitral valve regurgitation. MV peak gradient, 10.1 mmHg. The mean mitral valve gradient is 5.0 mmHg.  Tricuspid Valve: The tricuspid valve is normal in structure. Tricuspid valve regurgitation is trivial. Aortic Valve: The aortic valve is tricuspid. Aortic valve regurgitation is not visualized. Mild to moderate aortic valve sclerosis/calcification is present, without any evidence of aortic stenosis. Aortic valve mean gradient measures 16.3 mmHg. Aortic valve peak gradient measures 33.3 mmHg. Aortic valve area, by VTI  measures 2.39 cm. Pulmonic Valve: The pulmonic valve was not well visualized. Pulmonic valve regurgitation is trivial. Aorta: The aortic root and ascending aorta are structurally normal, with no evidence of dilitation. Venous: The inferior vena cava is normal in size with greater than 50% respiratory variability, suggesting right atrial pressure of 3 mmHg. IAS/Shunts: The interatrial septum was not assessed.  LEFT VENTRICLE PLAX 2D LVIDd:         5.08 cm  Diastology LVIDs:         3.24 cm  LV e' medial:    4.98 cm/s LV PW:         1.29 cm  LV E/e' medial:  32.7 LV IVS:        1.36 cm  LV e' lateral:   12.70 cm/s LVOT diam:     2.00 cm  LV E/e' lateral: 12.8 LV SV:         146 LV SV Index:   73 LVOT Area:     3.14 cm  RIGHT VENTRICLE RV S prime:     35.15 cm/s TAPSE (M-mode): 3.6 cm LEFT ATRIUM              Index       RIGHT ATRIUM           Index LA diam:        5.80 cm  2.88 cm/m  RA Area:     14.20 cm LA Vol (A2C):   78.4 ml  38.94 ml/m RA Volume:   29.90 ml  14.85 ml/m LA Vol (A4C):   118.0 ml 58.60 ml/m LA Biplane Vol: 96.9 ml  48.13 ml/m  AORTIC VALVE AV Area (Vmax):    1.03 cm AV Area (Vmean):   2.45 cm AV Area (VTI):     2.39 cm AV Vmax:           288.33 cm/s AV Vmean:          185.000 cm/s AV VTI:            0.612 m AV Peak Grad:      33.3 mmHg AV Mean Grad:      16.3 mmHg LVOT Vmax:         94.50 cm/s LVOT Vmean:        144.000 cm/s LVOT VTI:          0.466 m LVOT/AV VTI ratio: 0.76  AORTA Ao Root diam: 2.80 cm Ao Asc diam:  2.90 cm MITRAL VALVE                TRICUSPID VALVE MV Area (PHT): 1.90 cm     TR Peak grad:   25.0 mmHg MV Area VTI:   3.48 cm     TR Vmax:        250.00 cm/s MV Peak grad:  10.1 mmHg MV Mean grad:  5.0 mmHg     SHUNTS MV Vmax:       1.59 m/s     Systemic VTI:  0.47 m MV Vmean:      95.8 cm/s    Systemic Diam: 2.00 cm MV Decel Time: 400  msec MV E velocity: 163.00 cm/s MV A velocity: 142.00 cm/s MV E/A ratio:  1.15 Dorris Carnes MD Electronically signed by Dorris Carnes MD  Signature Date/Time: 12/23/2020/5:13:12 PM    Final         Scheduled Meds:  sodium chloride   Intravenous Once   Chlorhexidine Gluconate Cloth  6 each Topical Daily   furosemide  20 mg Intravenous Q12H   levothyroxine  50 mcg Oral QAC breakfast   metoprolol tartrate  12.5 mg Oral BID   pantoprazole  40 mg Oral Daily   sodium chloride flush  3 mL Intravenous Q12H   Continuous Infusions:  sodium chloride     sodium chloride       LOS: 3 days    Time spent: 35 minutes    Kona Yusuf A Arun Herrod, DO [facility]  If 7PM-7AM, please contact night-coverage [email or web address] 12/25/2020, 12:37 PM

## 2020-12-26 ENCOUNTER — Telehealth: Payer: Self-pay | Admitting: Gastroenterology

## 2020-12-26 DIAGNOSIS — D649 Anemia, unspecified: Secondary | ICD-10-CM

## 2020-12-26 LAB — VITAMIN B12: Vitamin B-12: 293 pg/mL (ref 180–914)

## 2020-12-26 LAB — HEMOGLOBIN AND HEMATOCRIT, BLOOD
HCT: 25.4 % — ABNORMAL LOW (ref 36.0–46.0)
Hemoglobin: 7.9 g/dL — ABNORMAL LOW (ref 12.0–15.0)

## 2020-12-26 LAB — SURGICAL PATHOLOGY

## 2020-12-26 MED ORDER — IRON (FERROUS SULFATE) 325 (65 FE) MG PO TABS: 325.0000 mg | ORAL_TABLET | Freq: Every day | ORAL | 2 refills | Status: DC

## 2020-12-26 MED ORDER — METOPROLOL TARTRATE 25 MG PO TABS: 12.5000 mg | ORAL_TABLET | Freq: Two times a day (BID) | ORAL | 2 refills | Status: DC

## 2020-12-26 MED ORDER — SODIUM CHLORIDE 0.9 % IV SOLN
300.0000 mg | Freq: Once | INTRAVENOUS | Status: AC
  Administered 2020-12-26: 300 mg via INTRAVENOUS
  Filled 2020-12-26: qty 15

## 2020-12-26 NOTE — Discharge Summary (Signed)
Physician Discharge Summary [facility]    Patient: Bethany Cunningham                   Admit date: 12/22/2020   DOB: Mar 20, 1951             Discharge date:12/26/2020/12:58 PM TOI:[id number]                          PCP: Celene Squibb, MD  Disposition: HOME  Recommendations for Outpatient Follow-up:   Follow up: With gastroenterologist and referral to [facility] for procedure to treat distal small bowel AVMs Instructed follow-up with current GI and PCP within 1 to 2 weeks. Meanwhile monitor H&H q. weekly  Discharge Condition: Stable   Code Status:   Code Status: Full Code  Diet recommendation: Cardiac diet   Discharge Diagnoses:    Active Problems:   Fatty liver disease, nonalcoholic   Acute blood loss anemia   Upper GI bleed   History of Present Illness/ Hospital Course Bethany Cunningham Summary:    Bethany Cunningham is a 70 y.o. female with medical history significant for morbid obesity, hypertension, dyslipidemia, and hypothyroidism who presented to the ED with complaints of black, tarry stools that began on 6/18.  She was admitted with acute blood loss anemia suspected secondary to upper GI bleed and had been started on PPI infusion as well as octreotide.  She has been transfused 2 units of PRBCs with stability overnight.  Troponins had elevated and she was noted to be more hypoxemic and therefore cardiology was consulted for evaluation.  This appears to be related to demand ischemia in the setting of her severe anemia.  She is noted to be mildly volume overloaded and Lasix has been initiated for diuresis.  Patient has undergone upper endoscopy on 6/21 with no significant findings of bleeding.  Small capsule study has been initiated.  Plans are to monitor repeat H&H every 12 hours and maintain on oral PPI.  Diet has been advanced to clear liquid.  GI following.  Acute blood loss anemia suspected secondary to upper GI bleed-stable -Status post EGD 6/21 with no acute findings,   - Capsule study -- pending results -Status post 3 unit PRBCs -Hemoglobin 8.1, 7.2, 7.9, 7.6, 8.1  >> 7.9, 7.9 now  -Her diet was advanced she tolerated well -GI consult with plans for upper endoscopy I -Remote use of Goody powders -Instructed to avoid all NSAIDs and NSAIDs products Capsule endoscopy revealed distal small bowel AVMs -GI referred the patient to [facility] for push endoscopy procedure... She is to follow-up ASAP  GI recommended IV iron 1 dose, and initiating supplement iron   Acute hypoxemic respiratory failure with dyspnea on exertion/near syncope -Weaned off to room air, satting 95% -Stable now on 2 L of oxygen, satting 96% -Appears to have some signs of volume overload on chest x-ray and with noted elevated BNP -S/P tx with -IV Lasix 20 mg twice daily ordered with improvement in dyspnea, continue in the setting of dynamic LVOT gradient -2D echocardiogram with dynamic LVOT gradient, will require cardiac MRI in the outpatient setting to better evaluate the degree and distribution of hypertrophy per cardiology   Troponin elevation -Appears to be demand ischemia, appreciate ongoing cardiology evaluation -2D echocardiogram with no signs of wall motion abnormalities, but dynamic LVOT gradient -Cardiology following   History of hypertension -Patient blood pressure was running soft on this admission, all home blood pressure  medication was discontinued She is to only resume metoprolol for cardiology recommendation    Dyslipidemia -May resume statin and fish oils   History of hypothyroidism -Continue levothyroxine   Morbid obesity -Lifestyle changes outpatient     DVT prophylaxis:SCDs Code Status: Full  Discharge Instructions:   Discharge Instructions     Activity as tolerated - No restrictions   Complete by: As directed    Call MD for:  difficulty breathing, headache or visual disturbances   Complete by: As directed    Call MD for:  persistant  dizziness or light-headedness   Complete by: As directed    Call MD for:  severe uncontrolled pain   Complete by: As directed    Call MD for:  temperature >100.4   Complete by: As directed    Diet - low sodium heart healthy   Complete by: As directed    Discharge instructions   Complete by: As directed    Gastroenterology referring you to a tertiary care for evaluation a procedure AVM malformation in distal small bowel that may be addressed. Please follow-up with GI recommendation, and the referral ASAP Avoid NSAIDs, such as Goody powders, aspirin, aspirin products ibuprofen,... Etc.   Increase activity slowly   Complete by: As directed         Medication List     STOP taking these medications    amLODipine 10 MG tablet Commonly known as: NORVASC   GOODYS BODY PAIN PO   losartan 100 MG tablet Commonly known as: COZAAR   tiZANidine 2 MG tablet Commonly known as: ZANAFLEX       TAKE these medications    atorvastatin 20 MG tablet Commonly known as: LIPITOR Take 20 mg by mouth daily.   furosemide 20 MG tablet Commonly known as: LASIX Take 20 mg by mouth daily.   Iron (Ferrous Sulfate) 325 (65 Fe) MG Tabs Take 325 mg by mouth daily.   levothyroxine 50 MCG tablet Commonly known as: SYNTHROID Take 1 tablet (50 mcg total) by mouth daily before breakfast.   metoprolol tartrate 25 MG tablet Commonly known as: LOPRESSOR Take 0.5 tablets (12.5 mg total) by mouth 2 (two) times daily.        Follow-up Information     Branch, Alphonse Guild, MD Follow up.   Specialty: Cardiology Why: Cardiology Hospital Follow-up on 01/14/2021 at 10:40 AM. Will be in the [facility]. Contact information: [address] [place] [address] [phone]                No Known Allergies   Procedures /Studies:   DG Chest 1 View  Result Date: 12/23/2020 CLINICAL DATA:  Respiratory failure EXAM: CHEST  1 VIEW COMPARISON:  Film from the previous day. FINDINGS:  Cardiac shadow is stable. Lungs are well aerated bilaterally. Mild interstitial edema is noted. No sizable effusion is noted. No bony abnormality is seen. IMPRESSION: New onset mild interstitial edema. Electronically Signed   By: Inez Catalina M.D.   On: 12/23/2020 15:01   DG Chest Port 1 View  Result Date: 12/22/2020 CLINICAL DATA:  Shortness of breath. Shortness of breath with exertion, black stools, dizziness when standing. EXAM: PORTABLE CHEST 1 VIEW COMPARISON:  Prior chest radiographs 01/02/2010 and earlier. FINDINGS: Cardiomegaly, unchanged. Central pulmonary vascular congestion and mild interstitial edema. Mild opacity within the lateral left lung base. No appreciable consolidation within the right lung. No evidence of pleural effusion or pneumothorax. No acute bony abnormality identified. IMPRESSION: Cardiomegaly. Pulmonary vascular congestion  and mild interstitial edema. Mild opacity within the lateral left lung base, favored to reflect atelectasis. Pneumonia is difficult to definitively exclude and clinical correlation is recommended. Electronically Signed   By: Kellie Simmering DO   On: 12/22/2020 09:28   DG Chest Port 1V same Day  Result Date: 12/22/2020 CLINICAL DATA:  Short of breath hypotension EXAM: PORTABLE CHEST 1 VIEW COMPARISON:  12/22/2020 FINDINGS: Heart size upper normal. Negative for heart failure. Lungs remain clear without infiltrate or effusion. IMPRESSION: No active disease. Electronically Signed   By: Franchot Gallo M.D.   On: 12/22/2020 16:25   US ABDOMEN COMPLETE W/ELASTOGRAPHY  Result Date: 12/23/2020 CLINICAL DATA:  Nonalcoholic fatty liver EXAM: ULTRASOUND ABDOMEN ULTRASOUND HEPATIC ELASTOGRAPHY TECHNIQUE: Sonography of the upper abdomen was performed. In addition, ultrasound elastography evaluation of the liver was performed. A region of interest was placed within the right lobe of the liver. Following application of a compressive sonographic pulse, tissue compressibility  was assessed. Multiple assessments were performed at the selected site. Median tissue compressibility was determined. Previously, hepatic stiffness was assessed by shear wave velocity. Based on recently published Society of Radiologists in Ultrasound consensus article, reporting is now recommended to be performed in the SI units of pressure (kiloPascals) representing hepatic stiffness/elasticity. The obtained result is compared to the published reference standards. (cACLD = compensated Advanced Chronic Liver Disease) COMPARISON:  None. FINDINGS: ULTRASOUND ABDOMEN Gallbladder: Status post cholecystectomy. Common bile duct: Diameter: 5 mm Liver: No focal lesion identified. Increased parenchymal echogenicity. Portal vein is patent on color Doppler imaging with normal direction of blood flow towards the liver. IVC: No abnormality visualized. Pancreas: Obscured by overlying bowel gas. Spleen: Size and appearance within normal limits. Right Kidney: Length: 15.0 cm. Enlarged by multiple cysts, some of which are thinly septated. No solid mass or hydronephrosis visualized. Left Kidney: Length: 11.8 cm. Echogenicity within normal limits. No mass or hydronephrosis visualized. Abdominal aorta: No aneurysm visualized. Other findings: None. ULTRASOUND HEPATIC ELASTOGRAPHY Device: Siemens Helix VTQ Patient position: Oblique Transducer 5C1 Number of measurements: 15 Hepatic segment:  8 Median kPa: 2.1 IQR: 0.1 IQR/Median kPa ratio: 0.05 Data quality:  Good Diagnostic category:  < or = 5 kPa: high probability of being normal The use of hepatic elastography is applicable to patients with viral hepatitis and non-alcoholic fatty liver disease. At this time, there is insufficient data for the referenced cut-off values and use in other causes of liver disease, including alcoholic liver disease. Patients, however, may be assessed by elastography and serve as their own reference standard/baseline. In patients with non-alcoholic liver  disease, the values suggesting compensated advanced chronic liver disease (cACLD) may be lower, and patients may need additional testing with elasticity results of 7-9 kPa. Please note that abnormal hepatic elasticity and shear wave velocities may also be identified in clinical settings other than with hepatic fibrosis, such as: acute hepatitis, elevated right heart and central venous pressures including use of beta blockers, veno-occlusive disease (Budd-Chiari), infiltrative processes such as mastocytosis/amyloidosis/infiltrative tumor/lymphoma, extrahepatic cholestasis, with hyperemia in the post-prandial state, and with liver transplantation. Correlation with patient history, laboratory data, and clinical condition recommended. Diagnostic Categories: < or =5 kPa: high probability of being normal < or =9 kPa: in the absence of other known clinical signs, rules out cACLD >9 kPa and ?13 kPa: suggestive of cACLD, but needs further testing >13 kPa: highly suggestive of cACLD > or =17 kPa: highly suggestive of cACLD with an increased probability of clinically significant portal hypertension IMPRESSION: ULTRASOUND ABDOMEN:  1.  Hepatic steatosis. 2.  Status post cholecystectomy. ULTRASOUND HEPATIC ELASTOGRAPHY: Median kPa:  2.1 Diagnostic category:  < or = 5 kPa: high probability of being normal Electronically Signed   By: Eddie Candle M.D.   On: 12/23/2020 11:35   ECHOCARDIOGRAM COMPLETE  Result Date: 12/23/2020    ECHOCARDIOGRAM REPORT   Patient Name:   DOYNE ELLINGER Date of Exam: 12/23/2020 Medical Rec #:  [id number]          Height:       62.0 in Accession #:    [id number]         Weight:       226.0 lb Date of Birth:  02/23/1951          BSA:          2.014 m Patient Age:    33 years           BP:           101/41 mmHg Patient Gender: F                  HR:           85 bpm. Exam Location:  [facility] Procedure: 2D Echo, Cardiac Doppler and Color Doppler Indications:    Elevated Troponin  History:         Patient has prior history of Echocardiogram examinations, most                 recent 01/19/2018. Risk Factors:Hypertension and Dyslipidemia.  Sonographer:    Wenda Low Referring Phys: [id number] [place]  1. Severe thickening of the proximal interventricular septum LVOT is narrow There is systolic anterior motion of mitral chordae Turbulent flow through LV/LVOT Peak gradkient through LV/LVOT/AV is 90 mm Hg consistent with HOCM physiology. Left ventricular ejection fraction, by estimation, is >75%. The left ventricle has hyperdynamic function. The left ventricle has no regional wall motion abnormalities. There is severe asymmetric left ventricular hypertrophy.  2. Right ventricular systolic function is normal. The right ventricular size is normal. There is mildly elevated pulmonary artery systolic pressure.  3. Left atrial size was severely dilated.  4. Mild to moderate mitral valve regurgitation.  5. The aortic valve is tricuspid. Aortic valve regurgitation is not visualized. Mild to moderate aortic valve sclerosis/calcification is present, without any evidence of aortic stenosis.  6. The inferior vena cava is normal in size with greater than 50% respiratory variability, suggesting right atrial pressure of 3 mmHg. FINDINGS  Left Ventricle: Severe thickening of the proximal interventricular septum LVOT is narrow There is systolic anterior motion of mitral chordae Turbulent flow through LV/LVOT Peak gradkient through LV/LVOT/AV is 90 mm Hg. Consistent with IHSS. Left ventricular ejection fraction, by estimation, is >75%. The left ventricle has hyperdynamic function. The left ventricle has no regional wall motion abnormalities. The left ventricular internal cavity size was normal in size. There is severe asymmetric left ventricular hypertrophy. Right Ventricle: The right ventricular size is normal. Right vetricular wall thickness was not assessed. Right ventricular systolic function is normal.  There is mildly elevated pulmonary artery systolic pressure. The tricuspid regurgitant velocity is 2.50 m/s, and with an assumed right atrial pressure of 3 mmHg, the estimated right ventricular systolic pressure is 41.3 mmHg. Left Atrium: Left atrial size was severely dilated. Right Atrium: Right atrial size was normal in size. Pericardium: There is no evidence of pericardial effusion. Mitral Valve: Mild mitral annular calcification. Mild to moderate mitral valve  regurgitation. MV peak gradient, 10.1 mmHg. The mean mitral valve gradient is 5.0 mmHg. Tricuspid Valve: The tricuspid valve is normal in structure. Tricuspid valve regurgitation is trivial. Aortic Valve: The aortic valve is tricuspid. Aortic valve regurgitation is not visualized. Mild to moderate aortic valve sclerosis/calcification is present, without any evidence of aortic stenosis. Aortic valve mean gradient measures 16.3 mmHg. Aortic valve peak gradient measures 33.3 mmHg. Aortic valve area, by VTI measures 2.39 cm. Pulmonic Valve: The pulmonic valve was not well visualized. Pulmonic valve regurgitation is trivial. Aorta: The aortic root and ascending aorta are structurally normal, with no evidence of dilitation. Venous: The inferior vena cava is normal in size with greater than 50% respiratory variability, suggesting right atrial pressure of 3 mmHg. IAS/Shunts: The interatrial septum was not assessed.  LEFT VENTRICLE PLAX 2D LVIDd:         5.08 cm  Diastology LVIDs:         3.24 cm  LV e' medial:    4.98 cm/s LV PW:         1.29 cm  LV E/e' medial:  32.7 LV IVS:        1.36 cm  LV e' lateral:   12.70 cm/s LVOT diam:     2.00 cm  LV E/e' lateral: 12.8 LV SV:         146 LV SV Index:   73 LVOT Area:     3.14 cm  RIGHT VENTRICLE RV S prime:     35.15 cm/s TAPSE (M-mode): 3.6 cm LEFT ATRIUM              Index       RIGHT ATRIUM           Index LA diam:        5.80 cm  2.88 cm/m  RA Area:     14.20 cm LA Vol (A2C):   78.4 ml  38.94 ml/m RA Volume:    29.90 ml  14.85 ml/m LA Vol (A4C):   118.0 ml 58.60 ml/m LA Biplane Vol: 96.9 ml  48.13 ml/m  AORTIC VALVE AV Area (Vmax):    1.03 cm AV Area (Vmean):   2.45 cm AV Area (VTI):     2.39 cm AV Vmax:           288.33 cm/s AV Vmean:          185.000 cm/s AV VTI:            0.612 m AV Peak Grad:      33.3 mmHg AV Mean Grad:      16.3 mmHg LVOT Vmax:         94.50 cm/s LVOT Vmean:        144.000 cm/s LVOT VTI:          0.466 m LVOT/AV VTI ratio: 0.76  AORTA Ao Root diam: 2.80 cm Ao Asc diam:  2.90 cm MITRAL VALVE                TRICUSPID VALVE MV Area (PHT): 1.90 cm     TR Peak grad:   25.0 mmHg MV Area VTI:   3.48 cm     TR Vmax:        250.00 cm/s MV Peak grad:  10.1 mmHg MV Mean grad:  5.0 mmHg     SHUNTS MV Vmax:       1.59 m/s     Systemic VTI:  0.47 m MV Vmean:  95.8 cm/s    Systemic Diam: 2.00 cm MV Decel Time: 400 msec MV E velocity: 163.00 cm/s MV A velocity: 142.00 cm/s MV E/A ratio:  1.15 Dorris Carnes MD Electronically signed by Dorris Carnes MD Signature Date/Time: 12/23/2020/5:13:12 PM    Final     Subjective:   Patient was seen and examined 12/26/2020, 12:58 PM Patient stable today. No acute distress.  No issues overnight Stable for discharge.  Discharge Exam:    Vitals:   12/26/20 0900 12/26/20 1044 12/26/20 1143 12/26/20 1200  BP: (!) 137/52 118/76  (!) 114/45  Pulse: 87 84 66 66  Resp: 20 19    Temp:   98 F (36.7 C)   TempSrc:   Oral   SpO2: 94% 97% 95% 92%  Weight:      Height:        General: Pt lying comfortably in bed & appears in no obvious distress. Cardiovascular: S1 & S2 heard, RRR, S1/S2 +. No murmurs, rubs, gallops or clicks. No JVD or pedal edema. Respiratory: Clear to auscultation without wheezing, rhonchi or crackles. No increased work of breathing. Abdominal:  Non-distended, non-tender & soft. No organomegaly or masses appreciated. Normal bowel sounds heard. CNS: Alert and oriented. No focal deficits. Extremities: no edema, no cyanosis      The results  of significant diagnostics from this hospitalization (including imaging, microbiology, ancillary and laboratory) are listed below for reference.      Microbiology:   Recent Results (from the past 240 hour(s))  Resp Panel by RT-PCR (Flu A&B, Covid) Nasopharyngeal Swab     Status: None   Collection Time: 12/22/20  9:15 AM   Specimen: Nasopharyngeal Swab; Nasopharyngeal(NP) swabs in vial transport medium  Result Value Ref Range Status   SARS Coronavirus 2 by RT PCR NEGATIVE NEGATIVE Final    Comment: (NOTE) SARS-CoV-2 target nucleic acids are NOT DETECTED.  The SARS-CoV-2 RNA is generally detectable in upper respiratory specimens during the acute phase of infection. The lowest concentration of SARS-CoV-2 viral copies this assay can detect is 138 copies/mL. A negative result does not preclude SARS-Cov-2 infection and should not be used as the sole basis for treatment or other patient management decisions. A negative result may occur with  improper specimen collection/handling, submission of specimen other than nasopharyngeal swab, presence of viral mutation(s) within the areas targeted by this assay, and inadequate number of viral copies(<138 copies/mL). A negative result must be combined with clinical observations, patient history, and epidemiological information. The expected result is Negative.  Fact Sheet for Patients:  EntrepreneurPulse.com.au  Fact Sheet for Healthcare Providers:  IncredibleEmployment.be  This test is no t yet approved or cleared by the [place] FDA and  has been authorized for detection and/or diagnosis of SARS-CoV-2 by FDA under an Emergency Use Authorization (EUA). This EUA will remain  in effect (meaning this test can be used) for the duration of the COVID-19 declaration under Section 564(b)(1) of the Act, 21 U.S.C.section 360bbb-3(b)(1), unless the authorization is terminated  or revoked sooner.        Influenza A by PCR NEGATIVE NEGATIVE Final   Influenza B by PCR NEGATIVE NEGATIVE Final    Comment: (NOTE) The Xpert Xpress SARS-CoV-2/FLU/RSV plus assay is intended as an aid in the diagnosis of influenza from Nasopharyngeal swab specimens and should not be used as a sole basis for treatment. Nasal washings and aspirates are unacceptable for Xpert Xpress SARS-CoV-2/FLU/RSV testing.  Fact Sheet for Patients: EntrepreneurPulse.com.au  Fact Sheet for Healthcare  Providers: IncredibleEmployment.be  This test is not yet approved or cleared by the [place] and has been authorized for detection and/or diagnosis of SARS-CoV-2 by FDA under an Emergency Use Authorization (EUA). This EUA will remain in effect (meaning this test can be used) for the duration of the COVID-19 declaration under Section 564(b)(1) of the Act, 21 U.S.C. section 360bbb-3(b)(1), unless the authorization is terminated or revoked.  Performed at [facility], [address] [place], [place] [address]   MRSA Next Gen by PCR, Nasal     Status: None   Collection Time: 12/22/20  5:00 PM   Specimen: Nasal Mucosa; Nasal Swab  Result Value Ref Range Status   MRSA by PCR Next Gen NOT DETECTED NOT DETECTED Final    Comment: (NOTE) The GeneXpert MRSA Assay (FDA approved for NASAL specimens only), is one component of a comprehensive MRSA colonization surveillance program. It is not intended to diagnose MRSA infection nor to guide or monitor treatment for MRSA infections. Test performance is not FDA approved in patients less than 60 years old. Performed at [facility], [address], [place], [place] [address]      Labs:   CBC: Recent Labs  Lab 12/22/20 [phone] 12/22/20 0852 12/23/20 0438 12/24/20 0644 12/24/20 1632 12/25/20 0331 12/25/20 1236 12/25/20 1512 12/26/20 0250  WBC 16.4*  --  15.5* 13.5*  --  11.1*  --   --   --   NEUTROABS 13.8*  --   --   --   --   --   --    --   --   HGB 6.3*   < > 8.1* 7.2* 7.9* 7.6* 8.1* 7.9* 7.9*  HCT 21.0*   < > 28.5* 24.2* 26.3* 25.5* 26.8* 26.8* 25.4*  MCV 86.4  --  92.8 90.0  --  90.7  --   --   --   PLT 337  --  303 286  --  269  --   --   --    < > = values in this interval not displayed.   Basic Metabolic Panel: Recent Labs  Lab 12/22/20 0843 12/22/20 0852 12/22/20 1451 12/23/20 0438 12/24/20 0644 12/25/20 0331  NA 140 143  --  141 142 142  K 3.8 3.8  --  4.2 4.0 3.7  CL 112* 112*  --  113* 114* 109  CO2 19*  --   --  22 23 27   GLUCOSE 143* 138*  --  143* 133* 130*  BUN 59* 60*  --  38* 29* 32*  CREATININE 1.00 0.90  --  1.07* 0.93 1.07*  CALCIUM 8.1*  --   --  8.0* 8.0* 8.0*  MG  --   --  2.2  --  2.3 2.2   Liver Function Tests: Recent Labs  Lab 12/22/20 0843 12/23/20 0438  AST 17 18  ALT 12 13  ALKPHOS 45 47  BILITOT 0.4 1.1  PROT 6.5 6.0*  ALBUMIN 3.6 3.3*   BNP (last 3 results) Recent Labs    12/22/20 0843 12/23/20 0439  BNP 133.0* 393.0*   Cardiac Enzymes: No results for input(s): CKTOTAL, CKMB, CKMBINDEX, TROPONINI in the last 168 hours. CBG: No results for input(s): GLUCAP in the last 168 hours. Hgb A1c No results for input(s): HGBA1C in the last 72 hours. Lipid Profile No results for input(s): CHOL, HDL, LDLCALC, TRIG, CHOLHDL, LDLDIRECT in the last 72 hours. Thyroid function studies No results for input(s): TSH, T4TOTAL, T3FREE, THYROIDAB in  the last 72 hours.  Invalid input(s): FREET3 Anemia work up Recent Labs    12/24/20 1632 12/26/20 0251  VITAMINB12  --  293  FERRITIN 10*  --   TIBC 341  --   IRON 19*  --    Urinalysis    Component Value Date/Time   COLORURINE YELLOW 05/13/2013 2003   APPEARANCEUR CLEAR 05/13/2013 2003   LABSPEC 1.020 05/13/2013 2003   PHURINE 6.0 05/13/2013 2003   GLUCOSEU NEGATIVE 05/13/2013 2003   HGBUR MODERATE (A) 05/13/2013 2003   [place] NEGATIVE 05/13/2013 2003   KETONESUR NEGATIVE 05/13/2013 2003   PROTEINUR TRACE (A)  05/13/2013 2003   UROBILINOGEN 0.2 05/13/2013 2003   NITRITE NEGATIVE 05/13/2013 2003   LEUKOCYTESUR NEGATIVE 05/13/2013 2003         Time coordinating discharge: Over 45 minutes  SIGNED: Deatra James, MD, FACP, FHM. [facility],  Please use [email or web address] to Page If 7PM-7AM, please contact night-coverage [email or web address] 12/26/2020, 12:58 PM

## 2020-12-26 NOTE — Progress Notes (Signed)
Patient departed ICU 05 via wheelchair at 1745 for home. Vital signs stable, SR on the monitor. Room air. 3 PIV removed. Patient bags filled with belongings as well as cell phone and handbag. After visit summary provided with information on new medication and education provided.

## 2020-12-26 NOTE — Telephone Encounter (Addendum)
Patient is getting discharged from the hospital. 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   Stacey:  Patient needs hospital follow-up in 2-4 weeks with available provider ( APP or Dr. Abbey Chatters).    [facility] Nurse:  - Please arrange CBC in 1 week. Dx: Anemia.  - We also need to arrange for patient to have IV iron in 2 weeks. When I come back from vacation we can work on this or you can discuss with available APP in the office. \   [facility] Clinical Pool:  Please place referral to [facility] or [facility] GI. Dx: Anemia/ bleed secondary to small bowel AMVs. Needs double balloon enteroscopy.   Patient is ok with doing to either [facility] or [facility], which ever we can get her in with faster.

## 2020-12-26 NOTE — Telephone Encounter (Signed)
error 

## 2020-12-26 NOTE — Care Management Important Message (Signed)
Important Message  Patient Details  Name: Bethany Cunningham MRN: [id number] Date of Birth: Sep 03, 1950   Medicare Important Message Given:  Yes     Boneta Lucks, RN 12/26/2020, 1:57 PM

## 2020-12-26 NOTE — Progress Notes (Signed)
Subjective: Feeling well.  Tolerating her diet without trouble.  No BM since yesterday morning when I saw her.  No abdominal pain, nausea, vomiting.  Again tells me she has not been taking any NSAIDs recently.  History of BC powder use.    She has no preference on [facility] versus [facility] for single or double-balloon enteroscopy.   Objective: Vital signs in last 24 hours: Temp:  [97.7 F (36.5 C)-98.3 F (36.8 C)] 98 F (36.7 C) (06/23 1143) Pulse Rate:  [55-87] 66 (06/23 1143) Resp:  [14-27] 19 (06/23 1044) BP: (72-137)/(42-76) 118/76 (06/23 1044) SpO2:  [86 %-97 %] 95 % (06/23 1143) Last BM Date: 12/25/20 General:   Alert and oriented, pleasant, no acute distress. Head:  Normocephalic and atraumatic. Abdomen:  Bowel sounds present, soft, non-tender, non-distended. No rebound or guarding. No masses appreciated  Extremities:  Without edema. Neurologic:  Alert and  oriented x4;  grossly normal neurologically. Psych: Normal mood and affect.  Intake/Output from previous day: 06/22 0701 - 06/23 0700 In: -  Out: 1000 [Urine:1000] Intake/Output this shift: Total I/O In: 503 [P.O.:500; I.V.:3] Out: -   Lab Results: Recent Labs    12/24/20 0644 12/24/20 1632 12/25/20 0331 12/25/20 1236 12/25/20 1512 12/26/20 0250  WBC 13.5*  --  11.1*  --   --   --   HGB 7.2*   < > 7.6* 8.1* 7.9* 7.9*  HCT 24.2*   < > 25.5* 26.8* 26.8* 25.4*  PLT 286  --  269  --   --   --    < > = values in this interval not displayed.   BMET Recent Labs    12/24/20 0644 12/25/20 0331  NA 142 142  K 4.0 3.7  CL 114* 109  CO2 23 27  GLUCOSE 133* 130*  BUN 29* 32*  CREATININE 0.93 1.07*  CALCIUM 8.0* 8.0*      Assessment: 70 year old female with history of fatty liver, HTN, HLD, hypothyroidism who presented with acute blood loss anemia, hypotension, melena, and hemoglobin 6.3 on admission without recent baseline.  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  Small bowel capsule was placed and revealed AV malformations and few erosions without active bleeding.  She received 3 unit PRBCs this admission, and hemoglobin remains low but stable over the last 24 hours at 7.9 today. Hemodynamically stable. No overt GI bleeding in the last 24 hours.  Notably, she was also found to be iron deficient with ferritin 10, iron 19, saturation 6% and is receiving IV iron today. B12 low normal.   Suspect AVMs are likely the source of her GI blood loss.  Query whether she may have been oozing intermittently for quite some time as we have no recent baseline hemoglobin. Her small bowel AVMs are not felt to be reachable by on push enteroscopy, and we have recommended referral to tertiary care center for single or double-balloon enteroscopy.  We will work on getting this arranged ASAP.   At this point, I feel it is reasonable for patient to be discharged from a GI standpoint with close monitoring of H/H outpatient. Would need to return to the hospital if recurrent bleeding.   Elevated troponin levels: Felt to be demand ischemia in setting of anemia. She remains chest pain free.   Exertional dyspnea: Appears to be multifactorial. Acute blood loss anemia contributing. She has a history of septal  hypertrophy with narrow LVOT which may also be contributing to her exertional dyspnea secondary to diminished cardiac output.  Cardiology has recommended outpatient MRI.  Plan: IV iron today and repeat IV iron outpatient in 2 weeks. Will hold off on starting oral iron in this acute setting as this will make it difficult for patient to tell if she is having overt bleeding.  Defer possible vitamin B12 supplementation to hospitalist. Hospitalist plans to discharge home today.  Repeat CBC in 1 week.  We will arrange follow-up with our office and arrange referral to tertiary care for single or  double-balloon enteroscopy. Continue PPI daily Strict avoidance of all NSAIDs. Return to the hospital if recurrent bleeding.     LOS: 4 days    12/26/2020, 11:59 AM   Bethany Cunningham, [facility]

## 2020-12-26 NOTE — Plan of Care (Signed)

## 2020-12-26 NOTE — Progress Notes (Signed)
    Refer to full Cardiology consult note from earlier this admission. She has been started on Lopressor 12.5mg  BID and would continue at current dosing given HR in the 60's to 70's. From our perspective, the plan is for an outpatient Lexiscan Myoview and possible Cardiac MRI given her LVOT gradient once stable from a GI perspective. Will arrange for an outpatient follow-up visit to review symptoms and schedule appropriate testing from there.  [facility] will sign off.   Medication Recommendations: Continue Lopressor 12.5mg  BID Other recommendations (labs, testing, etc): Outpatient [facility] and possible Cardiac MRI  Signed, Erma Heritage, PA-C 12/26/2020, 10:06 AM Pager: [phone]

## 2020-12-26 NOTE — Progress Notes (Signed)
Patient declines pneumococcal vaccine at this time. Education provided. Discharge planning in progress.

## 2020-12-27 ENCOUNTER — Other Ambulatory Visit: Payer: Self-pay | Admitting: *Deleted

## 2020-12-27 ENCOUNTER — Other Ambulatory Visit: Payer: Self-pay

## 2020-12-27 DIAGNOSIS — D649 Anemia, unspecified: Secondary | ICD-10-CM

## 2020-12-27 NOTE — Telephone Encounter (Signed)
Spoke with Bethany Cunningham.  She was made aware that she needs CBC in 1 week.  She was informed that she will need IV iron in 2 weeks.  She was made aware that we are sending referral for her and that she will need follow up ov with [place] in the future.  Bethany Cunningham will arrange follow up.  [facility] Clinical Pool to arrange referral.  Bethany Cunningham voiced understanding.

## 2020-12-27 NOTE — Telephone Encounter (Signed)
Printed the [employer] and list of medications to put with the physicians orders and will wait until Aliene Altes returns to sign.

## 2020-12-27 NOTE — Telephone Encounter (Signed)
Referral faxed to [facility] for procedure.

## 2020-12-27 NOTE — Addendum Note (Signed)
Addended by: Hassan Rowan on: 12/27/2020 09:45 AM   Modules accepted: Orders

## 2020-12-30 ENCOUNTER — Encounter (HOSPITAL_COMMUNITY): Payer: Self-pay | Admitting: Gastroenterology

## 2020-12-30 ENCOUNTER — Encounter: Payer: Self-pay | Admitting: Internal Medicine

## 2020-12-31 ENCOUNTER — Telehealth: Payer: Self-pay | Admitting: Internal Medicine

## 2020-12-31 DIAGNOSIS — D649 Anemia, unspecified: Secondary | ICD-10-CM | POA: Diagnosis not present

## 2020-12-31 NOTE — Telephone Encounter (Signed)
Call patient about labs, needs to know if she is supposed to go weekly

## 2020-12-31 NOTE — Telephone Encounter (Signed)
Returned the pt's call advised the pt in another week he will begin her IV iron and we will contact her more about that next week. Pt agreed

## 2021-01-01 LAB — CBC WITH DIFFERENTIAL/PLATELET
Basophils Absolute: 0.1 10*3/uL (ref 0.0–0.2)
Basos: 1 %
EOS (ABSOLUTE): 0.1 10*3/uL (ref 0.0–0.4)
Eos: 2 %
Hematocrit: 30 % — ABNORMAL LOW (ref 34.0–46.6)
Hemoglobin: 9.3 g/dL — ABNORMAL LOW (ref 11.1–15.9)
Immature Grans (Abs): 0.1 10*3/uL (ref 0.0–0.1)
Immature Granulocytes: 1 %
Lymphocytes Absolute: 1.9 10*3/uL (ref 0.7–3.1)
Lymphs: 23 %
MCH: 26.3 pg — ABNORMAL LOW (ref 26.6–33.0)
MCHC: 31 g/dL — ABNORMAL LOW (ref 31.5–35.7)
MCV: 85 fL (ref 79–97)
Monocytes Absolute: 0.9 10*3/uL (ref 0.1–0.9)
Monocytes: 10 %
Neutrophils Absolute: 5.4 10*3/uL (ref 1.4–7.0)
Neutrophils: 63 %
Platelets: 381 10*3/uL (ref 150–450)
RBC: 3.54 x10E6/uL — ABNORMAL LOW (ref 3.77–5.28)
RDW: 16.2 % — ABNORMAL HIGH (ref 11.7–15.4)
WBC: 8.5 10*3/uL (ref 3.4–10.8)

## 2021-01-02 ENCOUNTER — Other Ambulatory Visit: Payer: Self-pay

## 2021-01-02 DIAGNOSIS — D649 Anemia, unspecified: Secondary | ICD-10-CM

## 2021-01-02 DIAGNOSIS — K922 Gastrointestinal hemorrhage, unspecified: Secondary | ICD-10-CM

## 2021-01-02 DIAGNOSIS — K625 Hemorrhage of anus and rectum: Secondary | ICD-10-CM

## 2021-01-02 NOTE — Progress Notes (Signed)
ERROR

## 2021-01-03 NOTE — Telephone Encounter (Signed)
Spoke with [facility] procedure scheduler and was advised they do not perform double balloon enteroscopy.  Referral sent to [facility].

## 2021-01-14 ENCOUNTER — Encounter: Payer: Self-pay | Admitting: Cardiology

## 2021-01-14 ENCOUNTER — Ambulatory Visit: Payer: Medicare PPO | Admitting: Cardiology

## 2021-01-14 ENCOUNTER — Other Ambulatory Visit: Payer: Self-pay

## 2021-01-14 VITALS — BP 142/54 | HR 62 | Ht 62.0 in | Wt 231.0 lb

## 2021-01-14 DIAGNOSIS — R778 Other specified abnormalities of plasma proteins: Secondary | ICD-10-CM

## 2021-01-14 DIAGNOSIS — Z79899 Other long term (current) drug therapy: Secondary | ICD-10-CM | POA: Diagnosis not present

## 2021-01-14 DIAGNOSIS — J811 Chronic pulmonary edema: Secondary | ICD-10-CM

## 2021-01-14 NOTE — Progress Notes (Signed)
Clinical Summary Bethany Cunningham is a 70 y.o.female seen today for follow up of the following medical problems.   Elevated troponin - noted during recent admission 12/24/2020 with GI bleed, hypoxia - - 2019 nuclear stress no significant ischemia. - trop up to 741. EKG chronic lateral ST depressions - echo LVE >75%, elevated dynamic LVOT gradient peak gradient 90 mmHg, severe septal hypertrophy  - no recent chest pains, no SOB/DOE  2. Dynamic LVOT gradient - looks to be focal basal septal hypertrophy with narrow LVOT, in the setting of a hyperdynamic LV caused by severe anemia she has developed a significant dynamic LVOT gradient  3. Pulmonary edema - noted during recent admission - denies any recent symptoms  4. GI bleed Status post EGD 6/21 with no acute findings,  - Capsule endoscopy revealed distal small bowel AVMs -GI referred the patient to [facility] tertiary care for push endoscopy procedure -Status post 3 unit PRBCs\- Hgb 7.9 at d/c, up to 9.3    Past Medical History:  Diagnosis Date   Diverticulosis    Dyslipidemia    HTN (hypertension)    Hypothyroid    Skin disorder      No Known Allergies   Current Outpatient Medications  Medication Sig Dispense Refill   atorvastatin (LIPITOR) 20 MG tablet Take 20 mg by mouth daily.     furosemide (LASIX) 20 MG tablet Take 20 mg by mouth daily.     Iron, Ferrous Sulfate, 325 (65 Fe) MG TABS Take 325 mg by mouth daily. 30 tablet 2   levothyroxine (SYNTHROID) 50 MCG tablet Take 1 tablet (50 mcg total) by mouth daily before breakfast. 90 tablet 3   metoprolol tartrate (LOPRESSOR) 25 MG tablet Take 0.5 tablets (12.5 mg total) by mouth 2 (two) times daily. 30 tablet 2   No current facility-administered medications for this visit.     Past Surgical History:  Procedure Laterality Date   ABDOMINAL HYSTERECTOMY     BIOPSY  12/24/2020   Procedure: BIOPSY;  Surgeon: Harvel Quale, MD;  [facility];   Service: Gastroenterology;;   CHOLECYSTECTOMY     COLONOSCOPY N/A 09/28/2018   Procedure: COLONOSCOPY;  Surgeon: Danie Binder, MD;  [facility];  Service: Endoscopy;  Laterality: N/A;  2:00pm   ESOPHAGOGASTRODUODENOSCOPY (EGD) WITH PROPOFOL N/A 12/24/2020   Procedure: ESOPHAGOGASTRODUODENOSCOPY (EGD) WITH PROPOFOL;  Surgeon: Harvel Quale, MD;  [facility];  Service: Gastroenterology;  Laterality: N/A;   GIVENS CAPSULE STUDY  12/24/2020   Procedure: GIVENS CAPSULE STUDY;  Surgeon: Harvel Quale, MD;  [facility];  Service: Gastroenterology;;   POLYPECTOMY  09/28/2018   Procedure: POLYPECTOMY;  Surgeon: Danie Binder, MD;  [facility];  Service: Endoscopy;;   TONSILLECTOMY       No Known Allergies    Family History  Problem Relation Age of Onset   AAA (abdominal aortic aneurysm) Mother    Colon polyps Mother    Lung cancer Father    Thyroid disease Sister    Colon cancer Neg Hx    Inflammatory bowel disease Neg Hx      Social History Bethany Cunningham reports that she quit smoking about 19 years ago. She has never used smokeless tobacco. Bethany Cunningham reports current alcohol use.   Review of Systems CONSTITUTIONAL: No weight loss, fever, chills, weakness or fatigue.  HEENT: Eyes: No visual loss, blurred vision, double vision or yellow sclerae.No hearing loss, sneezing,  congestion, runny nose or sore throat.  SKIN: No rash or itching.  CARDIOVASCULAR: per hpi RESPIRATORY: No shortness of breath, cough or sputum.  GASTROINTESTINAL: No anorexia, nausea, vomiting or diarrhea. No abdominal pain or blood.  GENITOURINARY: No burning on urination, no polyuria NEUROLOGICAL: No headache, dizziness, syncope, paralysis, ataxia, numbness or tingling in the extremities. No change in bowel or bladder control.  MUSCULOSKELETAL: No muscle, back pain, joint pain or stiffness.  LYMPHATICS: No enlarged nodes. No history of  splenectomy.  PSYCHIATRIC: No history of depression or anxiety.  ENDOCRINOLOGIC: No reports of sweating, cold or heat intolerance. No polyuria or polydipsia.  Marland Kitchen   Physical Examination Today's Vitals   01/14/21 1025  BP: (!) 142/54  Pulse: 62  SpO2: 97%  Weight: 231 lb (104.8 kg)  Height: 5\' 2"  (1.575 m)   Body mass index is 42.25 kg/m.  Gen: resting comfortably, no acute distress HEENT: no scleral icterus, pupils equal round and reactive, no palptable cervical adenopathy,  CV: RRR, no m/r/g, no jvd Resp: Clear to auscultation bilaterally GI: abdomen is soft, non-tender, non-distended, normal bowel sounds, no hepatosplenomegaly MSK: extremities are warm, no edema.  Skin: warm, no rash Neuro:  no focal deficits Psych: appropriate affect      Assessment and Plan  Elevated troponin - likely secondary to severe anemia during admission with GI bleed - no recent cardiac symptoms. Would not pursue ischemic testing at this time, as even if noninvasive testing was abnormal would not pursue cath given recent GI bleed  2. Dynamic LVOT gradient - would plan for cmri once GI workup has been completed  3. Pulmonary edema -no symptoms, cnotinue lasix, check BMET/mg      Arnoldo Lenis, M.D.

## 2021-01-14 NOTE — Addendum Note (Signed)
Addended by: Christella Scheuermann C on: 01/14/2021 11:10 AM   Modules accepted: Orders

## 2021-01-14 NOTE — Patient Instructions (Signed)
Medication Instructions:  Your physician recommends that you continue on your current medications as directed. Please refer to the Current Medication list given to you today.  *If you need a refill on your cardiac medications before your next appointment, please call your pharmacy*   Lab Work: BMET MAGNESIUM If you have labs (blood work) drawn today and your tests are completely normal, you will receive your results only by: [place] (if you have MyChart) OR A paper copy in the mail If you have any lab test that is abnormal or we need to change your treatment, we will call you to review the results.   Testing/Procedures: None   Follow-Up: At [facility], you and your health needs are our priority.  As part of our continuing mission to provide you with exceptional heart care, we have created designated Provider Care Teams.  These Care Teams include your primary Cardiologist (physician) and Advanced Practice Providers (APPs -  Physician Assistants and Nurse Practitioners) who all work together to provide you with the care you need, when you need it.  We recommend signing up for the patient portal called "MyChart".  Sign up information is provided on this After Visit Summary.  MyChart is used to connect with patients for Virtual Visits (Telemedicine).  Patients are able to view lab/test results, encounter notes, upcoming appointments, etc.  Non-urgent messages can be sent to your provider as well.   To learn more about what you can do with MyChart, go to [email or web address].    Your next appointment:   4 month(s)  The format for your next appointment:   In Person  Provider:   Carlyle Dolly, MD- Linna Hoff   Other Instructions

## 2021-01-15 ENCOUNTER — Other Ambulatory Visit: Payer: Self-pay

## 2021-01-15 ENCOUNTER — Telehealth: Payer: Self-pay | Admitting: Internal Medicine

## 2021-01-15 DIAGNOSIS — K922 Gastrointestinal hemorrhage, unspecified: Secondary | ICD-10-CM

## 2021-01-15 DIAGNOSIS — D649 Anemia, unspecified: Secondary | ICD-10-CM

## 2021-01-15 DIAGNOSIS — K625 Hemorrhage of anus and rectum: Secondary | ICD-10-CM

## 2021-01-15 NOTE — Telephone Encounter (Signed)
Pt received her [facility] lab orders, but she is going to the [facility] and they need to be sent there.

## 2021-01-15 NOTE — Telephone Encounter (Signed)
Phoned and spoke with the pt, she is going to [place] July 27th will fax orders around that time. Too early to fax now.

## 2021-01-17 ENCOUNTER — Telehealth: Payer: Self-pay

## 2021-01-17 NOTE — Telephone Encounter (Signed)
Faxed Physician's orders to Short stay @ [facility] for the pt to be set up and scheduled for Feraheme 510 mg IV x 1. Also faxed pt's med list and insurance information along with it.

## 2021-01-31 ENCOUNTER — Other Ambulatory Visit: Payer: Self-pay | Admitting: Cardiology

## 2021-01-31 DIAGNOSIS — K625 Hemorrhage of anus and rectum: Secondary | ICD-10-CM | POA: Diagnosis not present

## 2021-01-31 DIAGNOSIS — K922 Gastrointestinal hemorrhage, unspecified: Secondary | ICD-10-CM | POA: Diagnosis not present

## 2021-01-31 DIAGNOSIS — D649 Anemia, unspecified: Secondary | ICD-10-CM | POA: Diagnosis not present

## 2021-01-31 DIAGNOSIS — J810 Acute pulmonary edema: Secondary | ICD-10-CM | POA: Diagnosis not present

## 2021-02-01 LAB — BASIC METABOLIC PANEL
BUN/Creatinine Ratio: 20 (ref 12–28)
BUN: 17 mg/dL (ref 8–27)
CO2: 21 mmol/L (ref 20–29)
Calcium: 9.1 mg/dL (ref 8.7–10.3)
Chloride: 104 mmol/L (ref 96–106)
Creatinine, Ser: 0.83 mg/dL (ref 0.57–1.00)
Glucose: 116 mg/dL — ABNORMAL HIGH (ref 65–99)
Potassium: 4.3 mmol/L (ref 3.5–5.2)
Sodium: 143 mmol/L (ref 134–144)
eGFR: 75 mL/min/{1.73_m2} (ref >59)

## 2021-02-01 LAB — CBC WITH DIFFERENTIAL/PLATELET
Basophils Absolute: 0.1 10*3/uL (ref 0.0–0.2)
Basos: 1 %
EOS (ABSOLUTE): 0.2 10*3/uL (ref 0.0–0.4)
Eos: 3 %
Hematocrit: 42.1 % (ref 34.0–46.6)
Hemoglobin: 13 g/dL (ref 11.1–15.9)
Immature Grans (Abs): 0 10*3/uL (ref 0.0–0.1)
Immature Granulocytes: 0 %
Lymphocytes Absolute: 1.7 10*3/uL (ref 0.7–3.1)
Lymphs: 25 %
MCH: 25.7 pg — ABNORMAL LOW (ref 26.6–33.0)
MCHC: 30.9 g/dL — ABNORMAL LOW (ref 31.5–35.7)
MCV: 83 fL (ref 79–97)
Monocytes Absolute: 0.4 10*3/uL (ref 0.1–0.9)
Monocytes: 6 %
Neutrophils Absolute: 4.4 10*3/uL (ref 1.4–7.0)
Neutrophils: 65 %
Platelets: 315 10*3/uL (ref 150–450)
RBC: 5.06 x10E6/uL (ref 3.77–5.28)
RDW: 14.9 % (ref 11.7–15.4)
WBC: 6.8 10*3/uL (ref 3.4–10.8)

## 2021-02-01 LAB — MAGNESIUM: Magnesium: 2.2 mg/dL (ref 1.6–2.3)

## 2021-02-01 LAB — SPECIMEN STATUS REPORT

## 2021-02-12 ENCOUNTER — Ambulatory Visit: Payer: Medicare PPO | Admitting: Internal Medicine

## 2021-02-12 ENCOUNTER — Encounter: Payer: Self-pay | Admitting: Internal Medicine

## 2021-02-12 ENCOUNTER — Other Ambulatory Visit: Payer: Self-pay

## 2021-02-12 VITALS — BP 152/74 | HR 65 | Temp 97.5°F | Ht 62.0 in | Wt 225.4 lb

## 2021-02-12 DIAGNOSIS — K760 Fatty (change of) liver, not elsewhere classified: Secondary | ICD-10-CM | POA: Diagnosis not present

## 2021-02-12 DIAGNOSIS — D500 Iron deficiency anemia secondary to blood loss (chronic): Secondary | ICD-10-CM | POA: Diagnosis not present

## 2021-02-12 DIAGNOSIS — K5521 Angiodysplasia of colon with hemorrhage: Secondary | ICD-10-CM | POA: Diagnosis not present

## 2021-02-12 NOTE — Patient Instructions (Signed)
I am happy to hear you are doing better since your hospitalization.  We will recheck blood counts today.  Agree with consultation at [facility] for potential double-balloon enteroscopy.  Continue on iron supplementation.  Follow-up with GI in 6 months  At [facility] we value your feedback. You may receive a survey about your visit today. Please share your experience as we strive to create trusting relationships with our patients to provide genuine, compassionate, quality care.  We appreciate your understanding and patience as we review any laboratory studies, imaging, and other diagnostic tests that are ordered as we care for you. Our office policy is 5 business days for review of these results, and any emergent or urgent results are addressed in a timely manner for your best interest. If you do not hear from our office in 1 week, please contact us.   We also encourage the use of MyChart, which contains your medical information for your review as well. If you are not enrolled in this feature, an access code is on this after visit summary for your convenience. Thank you for allowing [place] to be involved in your care.  It was great to see you today!  I hope you have a great rest of your summer!!    Elon Alas. Abbey Chatters, D.O. Gastroenterology and Hepatology [facility]

## 2021-02-12 NOTE — Progress Notes (Signed)
Referring Provider: Celene Squibb, MD Primary Care Physician:  Celene Squibb, MD Primary GI:  Dr. Abbey Chatters  Chief Complaint  Patient presents with   hospital f/u    No bleeding. Feels tired. Has appt at [facility] end of August. Refused iron infusion d/t she was due to have blood drawn end of July    HPI:   Bethany Cunningham is a 70 y.o. female who presents to clinic today for hospital follow-up visit.  Recently admitted to [facility] for acute blood loss anemia in the setting of upper GI bleeding.  EGD largely unremarkable, without evidence of bleeding or stigmata of recent bleeding.  Duodenal biopsy showed gastric heterotopia.  Subsequent small bowel capsule endoscopy revealed multiple AVMs throughout her small bowel.  She has been referred to [facility] for potential double-balloon enteroscopy.  Most recent hemoglobin 13, much improved from hospitalization.  Patient states she is doing overall much better than she was 2 months ago though in the last week she has felt more fatigued.  Has noted some dark stools though chronically takes iron as above.  No abdominal pain.  Has history of numerous adenomatous and serrated adenomas on colonoscopy in 2020 with recommended recall 3 years.  Past Medical History:  Diagnosis Date   Diverticulosis    Dyslipidemia    HTN (hypertension)    Hypothyroid    Skin disorder     Past Surgical History:  Procedure Laterality Date   ABDOMINAL HYSTERECTOMY     BIOPSY  12/24/2020   Procedure: BIOPSY;  Surgeon: Harvel Quale, MD;  [facility];  Service: Gastroenterology;;   CHOLECYSTECTOMY     COLONOSCOPY N/A 09/28/2018   Procedure: COLONOSCOPY;  Surgeon: Danie Binder, MD;  [facility];  Service: Endoscopy;  Laterality: N/A;  2:00pm   ESOPHAGOGASTRODUODENOSCOPY (EGD) WITH PROPOFOL N/A 12/24/2020   Procedure: ESOPHAGOGASTRODUODENOSCOPY (EGD) WITH PROPOFOL;  Surgeon: Harvel Quale, MD;  [facility];   Service: Gastroenterology;  Laterality: N/A;   GIVENS CAPSULE STUDY  12/24/2020   Procedure: GIVENS CAPSULE STUDY;  Surgeon: Harvel Quale, MD;  [facility];  Service: Gastroenterology;;   POLYPECTOMY  09/28/2018   Procedure: POLYPECTOMY;  Surgeon: Danie Binder, MD;  [facility];  Service: Endoscopy;;   TONSILLECTOMY      Current Outpatient Medications  Medication Sig Dispense Refill   atorvastatin (LIPITOR) 20 MG tablet Take 20 mg by mouth daily.     furosemide (LASIX) 20 MG tablet Take 20 mg by mouth daily.     Iron, Ferrous Sulfate, 325 (65 Fe) MG TABS Take 325 mg by mouth daily. 30 tablet 2   levothyroxine (SYNTHROID) 50 MCG tablet Take 1 tablet (50 mcg total) by mouth daily before breakfast. 90 tablet 3   metoprolol tartrate (LOPRESSOR) 25 MG tablet Take 0.5 tablets (12.5 mg total) by mouth 2 (two) times daily. 30 tablet 2   No current facility-administered medications for this visit.    Allergies as of 02/12/2021   (No Known Allergies)    Family History  Problem Relation Age of Onset   AAA (abdominal aortic aneurysm) Mother    Colon polyps Mother    Lung cancer Father    Thyroid disease Sister    Colon cancer Neg Hx    Inflammatory bowel disease Neg Hx     Social History   Socioeconomic History   Marital status: Single    Spouse name: Not on  file   Number of children: 0   Years of education: Not on file   Highest education level: Not on file  Occupational History   Occupation: retired Product manager: RET[employer]  Tobacco Use   Smoking status: Former    Types: Cigarettes    Quit date: 07/06/2001    Years since quitting: 19.6   Smokeless tobacco: Never   Tobacco comments:    Former smoker/ quit x 8 years  Vaping Use   Vaping Use: Never used  Substance and Sexual Activity   Alcohol use: Yes    Comment: occas   Drug use: No   Sexual activity: Yes    Birth control/protection: Surgical  Other Topics Concern   Not on file   Social History Narrative   Not on file   Social Determinants of Health   Financial Resource Strain: Not on file  Food Insecurity: Not on file  Transportation Needs: Not on file  Physical Activity: Not on file  Stress: Not on file  Social Connections: Not on file    Subjective: Review of Systems  Constitutional:  Positive for malaise/fatigue. Negative for chills and fever.  HENT:  Negative for congestion and hearing loss.   Eyes:  Negative for blurred vision and double vision.  Respiratory:  Negative for cough and shortness of breath.   Cardiovascular:  Negative for chest pain and palpitations.  Gastrointestinal:  Negative for abdominal pain, blood in stool, constipation, diarrhea, heartburn, melena and vomiting.  Genitourinary:  Negative for dysuria and urgency.  Musculoskeletal:  Negative for joint pain and myalgias.  Skin:  Negative for itching and rash.  Neurological:  Negative for dizziness and headaches.  Psychiatric/Behavioral:  Negative for depression. The patient is not nervous/anxious.     Objective: BP (!) 152/74   Pulse 65   Temp (!) 97.5 F (36.4 C) (Temporal)   Ht '5\' 2"'$  (1.575 m)   Wt 225 lb 6.4 oz (102.2 kg)   BMI 41.23 kg/m  Physical Exam Constitutional:      Appearance: Normal appearance. She is obese.  HENT:     Head: Normocephalic and atraumatic.  Eyes:     Extraocular Movements: Extraocular movements intact.     Conjunctiva/sclera: Conjunctivae normal.  Cardiovascular:     Rate and Rhythm: Normal rate and regular rhythm.  Pulmonary:     Effort: Pulmonary effort is normal.     Breath sounds: Normal breath sounds.  Abdominal:     General: Bowel sounds are normal.     Palpations: Abdomen is soft.  Musculoskeletal:        General: No swelling. Normal range of motion.     Cervical back: Normal range of motion and neck supple.  Skin:    General: Skin is warm and dry.     Coloration: Skin is not jaundiced.  Neurological:     General: No focal  deficit present.     Mental Status: She is alert and oriented to person, place, and time.  Psychiatric:        Mood and Affect: Mood normal.        Behavior: Behavior normal.     Assessment: *Iron deficiency anemia *Small bowel AVMs *Nonalcoholic fatty liver disease *Adenomatous and sessile serrated polyp  Plan: Patient's most recent hemoglobin much improved since her hospitalization.  We will recheck today.  She has been referred to [facility] for potential double-balloon enteroscopy given multiple small bowel AVMs seen on capsule endoscopy.  Continue on iron supplementation.  For her nonalcoholic fatty liver disease, recommend 1-2# weight loss per week until ideal body weight through exercise & diet. Low fat/cholesterol diet.   Avoid sweets, sodas, fruit juices, sweetened beverages like tea, etc. Gradually increase exercise from 15 min daily up to 1 hr per day 5 days/week. Limit alcohol use.  Colonoscopy recall 2023  Follow-up with GI in 6 months.  02/12/2021 10:45 AM   Disclaimer: This note was dictated with voice recognition software. Similar sounding words can inadvertently be transcribed and may not be corrected upon review.

## 2021-02-13 LAB — CBC
Hematocrit: 45 % (ref 34.0–46.6)
Hemoglobin: 13.9 g/dL (ref 11.1–15.9)
MCH: 25.6 pg — ABNORMAL LOW (ref 26.6–33.0)
MCHC: 30.9 g/dL — ABNORMAL LOW (ref 31.5–35.7)
MCV: 83 fL (ref 79–97)
Platelets: 374 10*3/uL (ref 150–450)
RBC: 5.43 x10E6/uL — ABNORMAL HIGH (ref 3.77–5.28)
RDW: 14.7 % (ref 11.7–15.4)
WBC: 10.4 10*3/uL (ref 3.4–10.8)

## 2021-02-17 ENCOUNTER — Telehealth: Payer: Self-pay

## 2021-02-17 NOTE — Telephone Encounter (Signed)
-----   Message from Arnoldo Lenis, MD sent at 02/17/2021  3:04 PM EDT ----- Normal labs  Zandra Abts MD

## 2021-02-17 NOTE — Telephone Encounter (Signed)
Pt notified and verbalized understanding. Pt had no questions or concerns at this time.  

## 2021-03-05 DIAGNOSIS — D500 Iron deficiency anemia secondary to blood loss (chronic): Secondary | ICD-10-CM | POA: Diagnosis not present

## 2021-03-05 DIAGNOSIS — K5521 Angiodysplasia of colon with hemorrhage: Secondary | ICD-10-CM | POA: Diagnosis not present

## 2021-03-14 ENCOUNTER — Other Ambulatory Visit: Payer: Self-pay

## 2021-03-14 ENCOUNTER — Other Ambulatory Visit: Payer: Medicare PPO

## 2021-03-14 ENCOUNTER — Encounter: Payer: Self-pay | Admitting: Internal Medicine

## 2021-03-14 ENCOUNTER — Ambulatory Visit: Payer: Medicare PPO | Admitting: Internal Medicine

## 2021-03-14 VITALS — BP 128/88 | HR 68 | Ht 62.0 in | Wt 226.4 lb

## 2021-03-14 DIAGNOSIS — E0500 Thyrotoxicosis with diffuse goiter without thyrotoxic crisis or storm: Secondary | ICD-10-CM

## 2021-03-14 DIAGNOSIS — E039 Hypothyroidism, unspecified: Secondary | ICD-10-CM | POA: Diagnosis not present

## 2021-03-14 LAB — TSH: TSH: 10.66 u[IU]/mL — ABNORMAL HIGH (ref 0.35–5.50)

## 2021-03-14 LAB — T4, FREE: Free T4: 0.61 ng/dL (ref 0.60–1.60)

## 2021-03-14 MED ORDER — LEVOTHYROXINE SODIUM 75 MCG PO TABS: 75.0000 ug | ORAL_TABLET | Freq: Every day | ORAL | 3 refills | Status: DC

## 2021-03-14 NOTE — Progress Notes (Addendum)
Normal Patient ID: Bethany Cunningham, female   DOB: 01-03-1951, 70 y.o.   MRN: [id number]   This visit occurred during the SARS-CoV-2 public health emergency.  Safety protocols were in place, including screening questions prior to the visit, additional usage of staff PPE, and extensive cleaning of exam room while observing appropriate contact time as indicated for disinfecting solutions.   HPI  Bethany Cunningham is a 70 y.o.-year-old female, returning for f/u for history of Graves ds and acquired hypothyroidism. Last visit 6 months ago.  Interim history: She recently had upper GI bleed with severe anemia.  She had EGD in 12/2020 which was essentially normal, but she had a capsule endoscopy that showed multiple small bowel AVMs. She will have cauterization at [facility] in 3 weeks. She has fatigue, CP - had an AMI in the setting of anemia. She has dry eyes, also red eyes, + some blurry vision in am. Appt with ophthalmology coming up next month. No heat intolerance, tremors.  Reviewed  history: Pt. has been dx with hypothyroidism in approximately 2014 >> on Levothyroxine 112 mcg >> TSH decreased >> dose of LT4 repeatedly decreased >> stopped 05-12/2015. Despite this, TSH remained suppressed.  03/06/2016: TSH 0.07, free T4 1.1 (0.8-1.8) 01/30/2016: TSH 0.11, free T4 0.9  Patient had a thyroid ultrasound on 03/02/2016 that showed a heterogeneous thyroid, without nodules.  Her Graves' antibodies were elevated.  We initially started methimazole 5 mg daily and decrease the dose to 2.5 mg daily in 06/2016.  She was able to come off methimazole in 08/2016.    However, her TSH increased again and free T4 decreased to lower than normal.  We started low-dose levothyroxine, 25 mcg daily, but subsequent TFTs have been thyrotoxic so we ended up stopping levothyroxine 01/24/2018.  In 03/2019, her TSH was elevated and this was confirmed a month later.  Therefore, we ended up starting levothyroxine 12.5 mg  daily.  In 09/2019: We increased levothyroxine to 25 mcg daily.  In 03/2020: We increased levothyroxine to 50 mcg daily  She is currently on levothyroxine 50 mcg daily: - still around 1 am (early dinner: 5-6 pm) - fasting - many hours from b'fast - no Ca - + Fe around 6 am - no PPIs - no multivitamins  - not on Biotin  Reviewed her TFTs: Lab Results  Component Value Date   TSH 2.51 09/17/2020   TSH 1.24 04/30/2020   TSH 6.12 (H) 03/18/2020   TSH 2.80 10/27/2019   TSH 4.54 (H) 09/13/2019   TSH 3.20 06/27/2019   TSH 6.16 (H) 04/26/2019   TSH 5.38 (H) 03/15/2019   TSH 0.37 06/24/2018   TSH 0.50 03/11/2018   FREET4 0.80 09/17/2020   FREET4 0.76 04/30/2020   FREET4 0.72 03/18/2020   FREET4 0.61 10/27/2019   FREET4 0.61 09/13/2019   FREET4 0.72 06/27/2019   FREET4 0.58 (L) 04/26/2019   FREET4 0.66 03/15/2019   FREET4 0.56 (L) 06/24/2018   FREET4 0.53 (L) 03/11/2018   T3FREE 3.4 04/26/2019   T3FREE 3.4 03/15/2019   T3FREE 4.1 06/24/2018   T3FREE 3.7 03/11/2018   T3FREE 3.9 01/24/2018   T3FREE 3.7 03/24/2017   T3FREE 3.9 01/15/2017   T3FREE 3.5 12/09/2016   T3FREE 4.5 (H) 10/07/2016   T3FREE 3.9 08/31/2016  11/2018: TSH normal reportedly  Her TSI's were elevated: Lab Results  Component Value Date   TSI 418 (H) 03/15/2019   TSI 446 (H) 03/11/2018   TSI 507 (H) 05/01/2016   Pt  denies: - feeling nodules in neck - hoarseness - dysphagia - choking - SOB with lying down  She has + FH of thyroid disorders in: mother and sister (both with Graves ds. - s/p RAI tx). No FH of thyroid cancer. No h/o radiation tx to head or neck.  No herbal supplements. No Biotin use. No recent steroids use.   Pt. also has a history of HTN, HL, TAH, cholecystectomy.   HPI: + see HPI  I reviewed pt's medications, allergies, PMH, social hx, family hx, and changes were documented in the history of present illness. Otherwise, unchanged from my initial visit note.  Past Medical  History:  Diagnosis Date   Diverticulosis    Dyslipidemia    HTN (hypertension)    Hypothyroid    Skin disorder    Past Surgical History:  Procedure Laterality Date   ABDOMINAL HYSTERECTOMY     BIOPSY  12/24/2020   Procedure: BIOPSY;  Surgeon: Harvel Quale, MD;  [facility];  Service: Gastroenterology;;   CHOLECYSTECTOMY     COLONOSCOPY N/A 09/28/2018   Procedure: COLONOSCOPY;  Surgeon: Danie Binder, MD;  [facility];  Service: Endoscopy;  Laterality: N/A;  2:00pm   ESOPHAGOGASTRODUODENOSCOPY (EGD) WITH PROPOFOL N/A 12/24/2020   Procedure: ESOPHAGOGASTRODUODENOSCOPY (EGD) WITH PROPOFOL;  Surgeon: Harvel Quale, MD;  [facility];  Service: Gastroenterology;  Laterality: N/A;   GIVENS CAPSULE STUDY  12/24/2020   Procedure: GIVENS CAPSULE STUDY;  Surgeon: Harvel Quale, MD;  [facility];  Service: Gastroenterology;;   POLYPECTOMY  09/28/2018   Procedure: POLYPECTOMY;  Surgeon: Danie Binder, MD;  [facility];  Service: Endoscopy;;   TONSILLECTOMY     Social History   Social History   Marital status: Single    Spouse name: N/A   Number of children: 0   Occupational History   retired Pharmacist, hospital Retired   Social History Main Topics   Smoking status: former Smoker - quit 2003   Smokeless tobacco: No        Alcohol use No   Drug use: No   Current Outpatient Medications on File Prior to Visit  Medication Sig Dispense Refill   atorvastatin (LIPITOR) 20 MG tablet Take 20 mg by mouth daily.     furosemide (LASIX) 20 MG tablet Take 20 mg by mouth daily.     Iron, Ferrous Sulfate, 325 (65 Fe) MG TABS Take 325 mg by mouth daily. 30 tablet 2   levothyroxine (SYNTHROID) 50 MCG tablet Take 1 tablet (50 mcg total) by mouth daily before breakfast. 90 tablet 3   metoprolol tartrate (LOPRESSOR) 25 MG tablet Take 0.5 tablets (12.5 mg total) by mouth 2 (two) times daily. 30 tablet 2   No current  facility-administered medications on file prior to visit.   No Known Allergies Family History  Problem Relation Age of Onset   AAA (abdominal aortic aneurysm) Mother    Colon polyps Mother    Lung cancer Father    Thyroid disease Sister    Colon cancer Neg Hx    Inflammatory bowel disease Neg Hx     PE: BP 128/88 (BP Location: Left Arm, Patient Position: Sitting, Cuff Size: Normal)   Pulse 68   Ht '5\' 2"'$  (1.575 m)   Wt 226 lb 6.4 oz (102.7 kg)   SpO2 97%   BMI 41.41 kg/m  Wt Readings from Last 3 Encounters:  03/14/21 226 lb 6.4 oz (102.7 kg)  02/12/21  225 lb 6.4 oz (102.2 kg)  01/14/21 231 lb (104.8 kg)   Constitutional: overweight, in NAD Eyes: PERRLA, EOMI, no exophthalmos ENT: moist mucous membranes, no thyromegaly, no cervical lymphadenopathy Cardiovascular: RRR, No MRG Respiratory: CTA B Gastrointestinal: abdomen soft, NT, ND, BS+ Musculoskeletal: no deformities, strength intact in all 4 Skin: moist, warm, no rashes Neurological: no tremor with outstretched hands, DTR normal in all 4  ASSESSMENT: 1. Graves ds.  2.  Acquired hypothyroidism  PLAN:  1.  Graves' disease -Patient with an unusual history of hypothyroidism, followed by thyroid toxicity, confirmed as Graves' disease based on elevated TSI antibodies, absence of nodules on the thyroid ultrasound and protracted course.  She was on methimazole but we were able to stop it completely in 08/2016.  Afterwards, her TSH trended up and we started levothyroxine.  TSH started to decrease again and we stopped levothyroxine 01/2018, after which she again developed hypothyroidism and we restarted levothyroxine.  She continues on this. -No signs of active Graves' ophthalmopathy: No double vision, eye pain.  She has dry eyes and some blurry vision in the morning.  She sees Dr. Delman Cheadle next month.  We will check TSI's today. -She does not have signs or symptoms of thyrotoxicosis.  She has chronic subjective hyperthermia, stable.   She had fatigue at the time of her anemia diagnosis.  At that time, Hb was 7.9!  Latest Hb reviewed and this was 13. -We will continue to manage her expectantly  2. Acquired hypothyroidism  - latest thyroid labs reviewed with pt. >> normal: Lab Results  Component Value Date   TSH 2.51 09/17/2020  - she continues on LT4 50 mcg daily - pt feels good on this dose. - we discussed about taking the thyroid hormone every day, with water, >30 minutes before breakfast, separated by >4 hours from acid reflux medications, calcium, iron, multivitamins. Pt. is taking it correctly.  At last visit, she was taking it at 1 AM, but we moved into the morning - will check thyroid tests today: TSH and fT4 - If labs are abnormal, she will need to return for repeat TFTs in 1.5 months - Otherwise, I will see her back in 6 months  Prefers full tablets, if need increased in dose.  Component     Latest Ref Rng & Units 03/14/2021  TSH     0.35 - 5.50 uIU/mL 10.66 (H)  T4,Free(Direct)     0.60 - 1.60 ng/dL 0.61  TSH is now much higher.  We will need to increase the dose of levothyroxine to 75 mcg daily and recheck the test in 1.5 months.  Component     Latest Ref Rng & Units 03/14/2021  TSI     <140 % baseline 217 (H)  TSI level is still elevated but much improved.  Philemon Kingdom, MD PhD [facility]

## 2021-03-14 NOTE — Patient Instructions (Addendum)
Please continue levothyroxine 50 mcg daily.  Take the thyroid hormone every day, with water, at least 30 minutes before breakfast, separated by at least 4 hours from: - acid reflux medications - calcium - iron - multivitamins  Please stop at the lab.  Please come back for a follow-up appointment in  6 months.

## 2021-03-17 NOTE — Telephone Encounter (Signed)
Pt calling in voiced that she would like her labs done at the [place]  location for [place]. Pt would like to know if there is any information needed additional to be in the system so she can have this done. Pt would like a call back .

## 2021-03-19 LAB — THYROID STIMULATING IMMUNOGLOBULIN: TSI: 217 % baseline — ABNORMAL HIGH (ref <140)

## 2021-03-21 ENCOUNTER — Telehealth: Payer: Self-pay | Admitting: Cardiology

## 2021-03-21 MED ORDER — METOPROLOL TARTRATE 25 MG PO TABS: 12.5000 mg | ORAL_TABLET | Freq: Two times a day (BID) | ORAL | 3 refills | Status: DC

## 2021-03-21 NOTE — Telephone Encounter (Signed)
Refill complete 

## 2021-03-21 NOTE — Telephone Encounter (Signed)
New message     The hospital changed her medication they stopped the losartan and put her on metoprolol   Needs refills     *STAT* If patient is at the pharmacy, call can be transferred to refill team.   1. Which medications need to be refilled? (please list name of each medication and dose if known) metoprolol tartrate 25 mg  1/2 tablet in morning 1/2 tablet at night   2. Which pharmacy/location (including street and city if local pharmacy) is medication to be sent to? [facility] at [id number]   3. Do they need a 30 day or 90 day supply? [place]

## 2021-04-01 DIAGNOSIS — D500 Iron deficiency anemia secondary to blood loss (chronic): Secondary | ICD-10-CM | POA: Diagnosis not present

## 2021-04-01 DIAGNOSIS — K922 Gastrointestinal hemorrhage, unspecified: Secondary | ICD-10-CM | POA: Diagnosis not present

## 2021-04-01 DIAGNOSIS — K5521 Angiodysplasia of colon with hemorrhage: Secondary | ICD-10-CM | POA: Diagnosis not present

## 2021-04-01 DIAGNOSIS — R933 Abnormal findings on diagnostic imaging of other parts of digestive tract: Secondary | ICD-10-CM | POA: Diagnosis not present

## 2021-04-01 DIAGNOSIS — I10 Essential (primary) hypertension: Secondary | ICD-10-CM | POA: Diagnosis not present

## 2021-04-01 DIAGNOSIS — Z6841 Body Mass Index (BMI) 40.0 and over, adult: Secondary | ICD-10-CM | POA: Diagnosis not present

## 2021-04-01 DIAGNOSIS — K31811 Angiodysplasia of stomach and duodenum with bleeding: Secondary | ICD-10-CM | POA: Diagnosis not present

## 2021-04-01 DIAGNOSIS — D509 Iron deficiency anemia, unspecified: Secondary | ICD-10-CM | POA: Diagnosis not present

## 2021-04-08 DIAGNOSIS — H5203 Hypermetropia, bilateral: Secondary | ICD-10-CM | POA: Diagnosis not present

## 2021-04-08 DIAGNOSIS — H04123 Dry eye syndrome of bilateral lacrimal glands: Secondary | ICD-10-CM | POA: Diagnosis not present

## 2021-04-26 ENCOUNTER — Other Ambulatory Visit: Payer: Self-pay | Admitting: Internal Medicine

## 2021-05-17 DIAGNOSIS — U071 COVID-19: Secondary | ICD-10-CM | POA: Diagnosis not present

## 2021-05-22 DIAGNOSIS — E782 Mixed hyperlipidemia: Secondary | ICD-10-CM | POA: Diagnosis not present

## 2021-05-22 DIAGNOSIS — R809 Proteinuria, unspecified: Secondary | ICD-10-CM | POA: Diagnosis not present

## 2021-05-24 DIAGNOSIS — E039 Hypothyroidism, unspecified: Secondary | ICD-10-CM | POA: Diagnosis not present

## 2021-05-24 DIAGNOSIS — J302 Other seasonal allergic rhinitis: Secondary | ICD-10-CM | POA: Diagnosis not present

## 2021-05-24 DIAGNOSIS — I10 Essential (primary) hypertension: Secondary | ICD-10-CM | POA: Diagnosis not present

## 2021-05-24 DIAGNOSIS — E0500 Thyrotoxicosis with diffuse goiter without thyrotoxic crisis or storm: Secondary | ICD-10-CM | POA: Diagnosis not present

## 2021-05-24 DIAGNOSIS — R809 Proteinuria, unspecified: Secondary | ICD-10-CM | POA: Diagnosis not present

## 2021-05-24 DIAGNOSIS — E782 Mixed hyperlipidemia: Secondary | ICD-10-CM | POA: Diagnosis not present

## 2021-05-24 DIAGNOSIS — D500 Iron deficiency anemia secondary to blood loss (chronic): Secondary | ICD-10-CM | POA: Diagnosis not present

## 2021-05-24 DIAGNOSIS — R7303 Prediabetes: Secondary | ICD-10-CM | POA: Diagnosis not present

## 2021-05-26 ENCOUNTER — Other Ambulatory Visit (INDEPENDENT_AMBULATORY_CARE_PROVIDER_SITE_OTHER): Payer: Medicare PPO

## 2021-05-26 DIAGNOSIS — E039 Hypothyroidism, unspecified: Secondary | ICD-10-CM

## 2021-05-26 LAB — TSH: TSH: 4.65 u[IU]/mL (ref 0.35–5.50)

## 2021-05-26 LAB — T4, FREE: Free T4: 0.79 ng/dL (ref 0.60–1.60)

## 2021-06-12 ENCOUNTER — Encounter: Payer: Self-pay | Admitting: Cardiology

## 2021-06-12 ENCOUNTER — Ambulatory Visit: Payer: Medicare PPO | Admitting: Cardiology

## 2021-06-12 VITALS — BP 140/70 | HR 74 | Ht 62.0 in | Wt 229.6 lb

## 2021-06-12 DIAGNOSIS — I517 Cardiomegaly: Secondary | ICD-10-CM

## 2021-06-12 DIAGNOSIS — Z23 Encounter for immunization: Secondary | ICD-10-CM

## 2021-06-12 DIAGNOSIS — R778 Other specified abnormalities of plasma proteins: Secondary | ICD-10-CM | POA: Diagnosis not present

## 2021-06-12 DIAGNOSIS — I10 Essential (primary) hypertension: Secondary | ICD-10-CM | POA: Diagnosis not present

## 2021-06-12 MED ORDER — METOPROLOL TARTRATE 25 MG PO TABS: 25.0000 mg | ORAL_TABLET | Freq: Two times a day (BID) | ORAL | 3 refills | Status: DC

## 2021-06-12 NOTE — Progress Notes (Signed)
Clinical Summary Ms. Brundidge is a 70 y.o.female seen today for follow up of the following medical problems.    Elevated troponin - noted during recent admission 12/24/2020 with GI bleed, hypoxia - - 2019 nuclear stress no significant ischemia. - trop up to 741. EKG chronic lateral ST depressions - echo LVE >75%, elevated dynamic LVOT gradient peak gradient 90 mmHg, severe septal hypertrophy      2. Dynamic LVOT gradient - looks to be focal basal septal hypertrophy with narrow LVOT, in the setting of a hyperdynamic LV caused by severe anemia she has developed a significant dynamic LVOT gradient  - DOE with exertion with moderate to severe yard work.   3. Pulmonary edema - noted during recent admission - denies any recent symptoms  - no recent edema.    4. GI bleed Status post EGD 6/21 with no acute findings,  - Capsule endoscopy revealed distal small bowel AVMs -GI referred the patient to [facility] tertiary care for push endoscopy procedure -Status post 3 unit PRBCs\- Hgb 7.9 at d/c, up to 9.3   5. HTN - compliantw with meds.   6. Hyperlipidemia - followed by pcp  Past Medical History:  Diagnosis Date   Diverticulosis    Dyslipidemia    HTN (hypertension)    Hypothyroid    Skin disorder      No Known Allergies   Current Outpatient Medications  Medication Sig Dispense Refill   atorvastatin (LIPITOR) 20 MG tablet Take 20 mg by mouth daily.     furosemide (LASIX) 20 MG tablet Take 20 mg by mouth daily.     Iron, Ferrous Sulfate, 325 (65 Fe) MG TABS Take 325 mg by mouth daily. 30 tablet 2   levothyroxine (SYNTHROID) 75 MCG tablet Take 1 tablet (75 mcg total) by mouth daily before breakfast. 45 tablet 3   metoprolol tartrate (LOPRESSOR) 25 MG tablet Take 0.5 tablets (12.5 mg total) by mouth 2 (two) times daily. 90 tablet 3   No current facility-administered medications for this visit.     Past Surgical History:  Procedure Laterality Date   ABDOMINAL  HYSTERECTOMY     BIOPSY  12/24/2020   Procedure: BIOPSY;  Surgeon: Harvel Quale, MD;  [facility];  Service: Gastroenterology;;   CHOLECYSTECTOMY     COLONOSCOPY N/A 09/28/2018   Procedure: COLONOSCOPY;  Surgeon: Danie Binder, MD;  [facility];  Service: Endoscopy;  Laterality: N/A;  2:00pm   ESOPHAGOGASTRODUODENOSCOPY (EGD) WITH PROPOFOL N/A 12/24/2020   Procedure: ESOPHAGOGASTRODUODENOSCOPY (EGD) WITH PROPOFOL;  Surgeon: Harvel Quale, MD;  [facility];  Service: Gastroenterology;  Laterality: N/A;   GIVENS CAPSULE STUDY  12/24/2020   Procedure: GIVENS CAPSULE STUDY;  Surgeon: Harvel Quale, MD;  [facility];  Service: Gastroenterology;;   POLYPECTOMY  09/28/2018   Procedure: POLYPECTOMY;  Surgeon: Danie Binder, MD;  [facility];  Service: Endoscopy;;   TONSILLECTOMY       No Known Allergies    Family History  Problem Relation Age of Onset   AAA (abdominal aortic aneurysm) Mother    Colon polyps Mother    Lung cancer Father    Thyroid disease Sister    Colon cancer Neg Hx    Inflammatory bowel disease Neg Hx      Social History Ms. Bortner reports that she quit smoking about 19 years ago. Her smoking use included cigarettes. She has never used smokeless tobacco. Ms.  Deegan reports current alcohol use.   Review of Systems CONSTITUTIONAL: No weight loss, fever, chills, weakness or fatigue.  HEENT: Eyes: No visual loss, blurred vision, double vision or yellow sclerae.No hearing loss, sneezing, congestion, runny nose or sore throat.  SKIN: No rash or itching.  CARDIOVASCULAR: per hpi RESPIRATORY: No shortness of breath, cough or sputum.  GASTROINTESTINAL: No anorexia, nausea, vomiting or diarrhea. No abdominal pain or blood.  GENITOURINARY: No burning on urination, no polyuria NEUROLOGICAL: No headache, dizziness, syncope, paralysis, ataxia, numbness or tingling in the  extremities. No change in bowel or bladder control.  MUSCULOSKELETAL: No muscle, back pain, joint pain or stiffness.  LYMPHATICS: No enlarged nodes. No history of splenectomy.  PSYCHIATRIC: No history of depression or anxiety.  ENDOCRINOLOGIC: No reports of sweating, cold or heat intolerance. No polyuria or polydipsia.  Marland Kitchen   Physical Examination Today's Vitals   06/12/21 0812  BP: 140/70  Pulse: 74  SpO2: 97%  Weight: 229 lb 9.6 oz (104.1 kg)  Height: 5\' 2"  (1.575 m)   Body mass index is 41.99 kg/m.  Gen: resting comfortably, no acute distress HEENT: no scleral icterus, pupils equal round and reactive, no palptable cervical adenopathy,  CV: RRR, 2/6 systolic murmur rusb, no jvd Resp: Clear to auscultation bilaterally GI: abdomen is soft, non-tender, non-distended, normal bowel sounds, no hepatosplenomegaly MSK: extremities are warm, no edema.  Skin: warm, no rash Neuro:  no focal deficits Psych: appropriate affect   Diagnostic Studies     Assessment and Plan  Elevated troponin - likely secondary to severe anemia during admission with GI bleed - no recent cardiac symptoms.No plans to repeat ischemic testing at this time.    2. Dynamic LVOT gradient - we discussed cMRI to better evaluate. Interestingly she has a metal ring that is stuck on her finger and has not been removed for decades despite multiple attempts, would prohibit MRI - with some exertional symptoms we will increase her lopressor to 25mg  bid.    3. HTN - elevated today, follow with increased lopressor dose.      Arnoldo Lenis, M.D.

## 2021-06-12 NOTE — Patient Instructions (Signed)
Medication Instructions:  Your physician has recommended you make the following change in your medication:  INCREASE Lopressor to 25 mg tablets twice daily  *If you need a refill on your cardiac medications before your next appointment, please call your pharmacy*   Lab Work: We have requested your most recent lab work from your primary care provider If you have labs (blood work) drawn today and your tests are completely normal, you will receive your results only by: [place] (if you have MyChart) OR A paper copy in the mail If you have any lab test that is abnormal or we need to change your treatment, we will call you to review the results.   Testing/Procedures: None   Follow-Up: At [facility], you and your health needs are our priority.  As part of our continuing mission to provide you with exceptional heart care, we have created designated Provider Care Teams.  These Care Teams include your primary Cardiologist (physician) and Advanced Practice Providers (APPs -  Physician Assistants and Nurse Practitioners) who all work together to provide you with the care you need, when you need it.  We recommend signing up for the patient portal called "MyChart".  Sign up information is provided on this After Visit Summary.  MyChart is used to connect with patients for Virtual Visits (Telemedicine).  Patients are able to view lab/test results, encounter notes, upcoming appointments, etc.  Non-urgent messages can be sent to your provider as well.   To learn more about what you can do with MyChart, go to [email or web address].    Your next appointment:   6 month(s)  The format for your next appointment:   In Person  Provider:   Carlyle Dolly, MD    Other Instructions  DASH Eating Plan DASH stands for Dietary Approaches to Stop Hypertension. The DASH eating plan is a healthy eating plan that has been shown to: Reduce high blood pressure (hypertension). Reduce your risk for  type 2 diabetes, heart disease, and stroke. Help with weight loss. What are tips for following this plan? Reading food labels Check food labels for the amount of salt (sodium) per serving. Choose foods with less than 5 percent of the Daily Value of sodium. Generally, foods with less than 300 milligrams (mg) of sodium per serving fit into this eating plan. To find whole grains, look for the word "whole" as the first word in the ingredient list. Shopping Buy products labeled as "low-sodium" or "no salt added." Buy fresh foods. Avoid canned foods and pre-made or frozen meals. Cooking Avoid adding salt when cooking. Use salt-free seasonings or herbs instead of table salt or sea salt. Check with your health care provider or pharmacist before using salt substitutes. Do not fry foods. Cook foods using healthy methods such as baking, boiling, grilling, roasting, and broiling instead. Cook with heart-healthy oils, such as olive, canola, avocado, soybean, or sunflower oil. Meal planning  Eat a balanced diet that includes: 4 or more servings of fruits and 4 or more servings of vegetables each day. Try to fill one-half of your plate with fruits and vegetables. 6-8 servings of whole grains each day. Less than 6 oz (170 g) of lean meat, poultry, or fish each day. A 3-oz (85-g) serving of meat is about the same size as a deck of cards. One egg equals 1 oz (28 g). 2-3 servings of low-fat dairy each day. One serving is 1 cup (237 mL). 1 serving of nuts, seeds, or beans 5 times each week.  2-3 servings of heart-healthy fats. Healthy fats called omega-3 fatty acids are found in foods such as walnuts, flaxseeds, fortified milks, and eggs. These fats are also found in cold-water fish, such as sardines, salmon, and mackerel. Limit how much you eat of: Canned or prepackaged foods. Food that is high in trans fat, such as some fried foods. Food that is high in saturated fat, such as fatty meat. Desserts and other  sweets, sugary drinks, and other foods with added sugar. Full-fat dairy products. Do not salt foods before eating. Do not eat more than 4 egg yolks a week. Try to eat at least 2 vegetarian meals a week. Eat more home-cooked food and less restaurant, buffet, and fast food. Lifestyle When eating at a restaurant, ask that your food be prepared with less salt or no salt, if possible. If you drink alcohol: Limit how much you use to: 0-1 drink a day for women who are not pregnant. 0-2 drinks a day for men. Be aware of how much alcohol is in your drink. In the [place] one drink equals one 12 oz bottle of beer (355 mL), one 5 oz glass of wine (148 mL), or one 1 oz glass of hard liquor (44 mL). General information Avoid eating more than 2,300 mg of salt a day. If you have hypertension, you may need to reduce your sodium intake to 1,500 mg a day. Work with your health care provider to maintain a healthy body weight or to lose weight. Ask what an ideal weight is for you. Get at least 30 minutes of exercise that causes your heart to beat faster (aerobic exercise) most days of the week. Activities may include walking, swimming, or biking. Work with your health care provider or dietitian to adjust your eating plan to your individual calorie needs. What foods should I eat? Fruits All fresh, dried, or frozen fruit. Canned fruit in natural juice (without added sugar). Vegetables Fresh or frozen vegetables (raw, steamed, roasted, or grilled). Low-sodium or reduced-sodium tomato and vegetable juice. Low-sodium or reduced-sodium tomato sauce and tomato paste. Low-sodium or reduced-sodium canned vegetables. Grains Whole-grain or whole-wheat bread. Whole-grain or whole-wheat pasta. Brown rice. [place]. Bulgur. Whole-grain and low-sodium cereals. Pita bread. Low-fat, low-sodium crackers. Whole-wheat flour tortillas. Meats and other proteins Skinless chicken or [place]. Ground chicken or [place]. Pork with  fat trimmed off. Fish and seafood. Egg whites. Dried beans, peas, or lentils. Unsalted nuts, nut butters, and seeds. Unsalted canned beans. Lean cuts of beef with fat trimmed off. Low-sodium, lean precooked or cured meat, such as sausages or meat loaves. Dairy Low-fat (1%) or fat-free (skim) milk. Reduced-fat, low-fat, or fat-free cheeses. Nonfat, low-sodium ricotta or cottage cheese. Low-fat or nonfat yogurt. Low-fat, low-sodium cheese. Fats and oils Soft margarine without trans fats. Vegetable oil. Reduced-fat, low-fat, or light mayonnaise and salad dressings (reduced-sodium). Canola, safflower, olive, avocado, soybean, and sunflower oils. Avocado. Seasonings and condiments Herbs. Spices. Seasoning mixes without salt. Other foods Unsalted popcorn and pretzels. Fat-free sweets. The items listed above may not be a complete list of foods and beverages you can eat. Contact a dietitian for more information. What foods should I avoid? Fruits Canned fruit in a light or heavy syrup. Fried fruit. Fruit in cream or butter sauce. Vegetables Creamed or fried vegetables. Vegetables in a cheese sauce. Regular canned vegetables (not low-sodium or reduced-sodium). Regular canned tomato sauce and paste (not low-sodium or reduced-sodium). Regular tomato and vegetable juice (not low-sodium or reduced-sodium). Angie Fava. Olives. Grains Baked goods made  with fat, such as croissants, muffins, or some breads. Dry pasta or rice meal packs. Meats and other proteins Fatty cuts of meat. Ribs. Fried meat. Berniece Salines. Bologna, salami, and other precooked or cured meats, such as sausages or meat loaves. Fat from the back of a pig (fatback). Bratwurst. Salted nuts and seeds. Canned beans with added salt. Canned or smoked fish. Whole eggs or egg yolks. Chicken or [place] with skin. Dairy Whole or 2% milk, cream, and half-and-half. Whole or full-fat cream cheese. Whole-fat or sweetened yogurt. Full-fat cheese. Nondairy creamers.  Whipped toppings. Processed cheese and cheese spreads. Fats and oils Butter. Stick margarine. Lard. Shortening. Ghee. Bacon fat. Tropical oils, such as coconut, palm kernel, or palm oil. Seasonings and condiments Onion salt, garlic salt, seasoned salt, table salt, and sea salt. Worcestershire sauce. Tartar sauce. Barbecue sauce. Teriyaki sauce. Soy sauce, including reduced-sodium. Steak sauce. Canned and packaged gravies. Fish sauce. Oyster sauce. Cocktail sauce. Store-bought horseradish. Ketchup. Mustard. Meat flavorings and tenderizers. Bouillon cubes. Hot sauces. Pre-made or packaged marinades. Pre-made or packaged taco seasonings. Relishes. Regular salad dressings. Other foods Salted popcorn and pretzels. The items listed above may not be a complete list of foods and beverages you should avoid. Contact a dietitian for more information. Where to find more information National Heart, Lung, and Blood Institute: https://wilson-eaton.com/ American Heart Association: www.heart.org Academy of Nutrition and Dietetics: www.eatright.[place]: www.kidney.org Summary The DASH eating plan is a healthy eating plan that has been shown to reduce high blood pressure (hypertension). It may also reduce your risk for type 2 diabetes, heart disease, and stroke. When on the DASH eating plan, aim to eat more fresh fruits and vegetables, whole grains, lean proteins, low-fat dairy, and heart-healthy fats. With the DASH eating plan, you should limit salt (sodium) intake to 2,300 mg a day. If you have hypertension, you may need to reduce your sodium intake to 1,500 mg a day. Work with your health care provider or dietitian to adjust your eating plan to your individual calorie needs. This information is not intended to replace advice given to you by your health care provider. Make sure you discuss any questions you have with your health care provider. Document Revised: 05/26/2019 Document Reviewed:  05/26/2019 Elsevier Patient Education  2022 [employer].

## 2021-07-12 DIAGNOSIS — I10 Essential (primary) hypertension: Secondary | ICD-10-CM | POA: Diagnosis not present

## 2021-07-12 DIAGNOSIS — R432 Parageusia: Secondary | ICD-10-CM | POA: Diagnosis not present

## 2021-08-28 ENCOUNTER — Encounter: Payer: Self-pay | Admitting: Internal Medicine

## 2021-09-06 ENCOUNTER — Other Ambulatory Visit: Payer: Self-pay | Admitting: Internal Medicine

## 2021-09-11 ENCOUNTER — Encounter: Payer: Self-pay | Admitting: *Deleted

## 2021-09-12 ENCOUNTER — Other Ambulatory Visit: Payer: Self-pay

## 2021-09-12 ENCOUNTER — Ambulatory Visit: Payer: Medicare PPO | Admitting: Internal Medicine

## 2021-09-12 ENCOUNTER — Encounter: Payer: Self-pay | Admitting: Internal Medicine

## 2021-09-12 VITALS — BP 110/66 | HR 61 | Ht 62.0 in | Wt 235.8 lb

## 2021-09-12 DIAGNOSIS — E0500 Thyrotoxicosis with diffuse goiter without thyrotoxic crisis or storm: Secondary | ICD-10-CM

## 2021-09-12 DIAGNOSIS — E039 Hypothyroidism, unspecified: Secondary | ICD-10-CM | POA: Diagnosis not present

## 2021-09-12 LAB — T4, FREE: Free T4: 0.82 ng/dL (ref 0.60–1.60)

## 2021-09-12 LAB — TSH: TSH: 4.51 u[IU]/mL (ref 0.35–5.50)

## 2021-09-12 MED ORDER — LEVOTHYROXINE SODIUM 88 MCG PO TABS: 88.0000 ug | ORAL_TABLET | Freq: Every day | ORAL | 3 refills | Status: DC

## 2021-09-12 NOTE — Patient Instructions (Signed)
Please continue levothyroxine 75 mcg daily. ? ?Take the thyroid hormone every day, with water, at least 30 minutes before breakfast, separated by at least 4 hours from: ?- acid reflux medications ?- calcium ?- iron ?- multivitamins ? ?Please stop at the lab. ? ?Please come back for a follow-up appointment in 1 year. ? ?

## 2021-09-12 NOTE — Progress Notes (Signed)
Normal Patient ID: Bethany Cunningham, female   DOB: 1951-05-29, 71 y.o.   MRN: [id number]   This visit occurred during the SARS-CoV-2 public health emergency.  Safety protocols were in place, including screening questions prior to the visit, additional usage of staff PPE, and extensive cleaning of exam room while observing appropriate contact time as indicated for disinfecting solutions.   HPI  Bethany Cunningham is a 71 y.o.-year-old female, returning for f/u for history of Graves ds and acquired hypothyroidism. Last visit 6 months ago.  Interim history: She has a history of fatigue due to anemia in the setting of upper GI bleed (multiple small bowel AVMs).  She had AVM cauterization at [facility].  No more bleeding afterwards. She continues to have dry eyes, also red eyes, + some blurry vision in am. No active GO per ophthalmology last year. No heat intolerance, tremors. She has SOB when walks outside only.  Reviewed and addended history: Pt. has been dx with hypothyroidism in approximately 2014 >> on Levothyroxine 112 mcg >> TSH decreased >> dose of LT4 repeatedly decreased >> stopped 05-12/2015. Despite this, TSH remained suppressed.  03/06/2016: TSH 0.07, free T4 1.1 (0.8-1.8) 01/30/2016: TSH 0.11, free T4 0.9  Patient had a thyroid ultrasound on 03/02/2016 that showed a heterogeneous thyroid, without nodules.  Her Graves' antibodies were elevated.  We initially started methimazole 5 mg daily and decrease the dose to 2.5 mg daily in 06/2016.  She was able to come off methimazole in 08/2016.    However, her TSH increased again and free T4 decreased to lower than normal.  We started low-dose levothyroxine, 25 mcg daily, but subsequent TFTs have been thyrotoxic so we ended up stopping levothyroxine 01/24/2018.  In 03/2019, her TSH was elevated and this was confirmed a month later.  Therefore, we ended up starting levothyroxine 12.5 mg daily.  In 09/2019: We increased levothyroxine to 25 mcg  daily.  In 03/2020: We increased levothyroxine to 50 mcg daily.  In 03/2021: We increased levothyroxine to 75 mcg daily.  She is currently on levothyroxine 75 mcg daily (increased 03/2021): - still around 1 am (early dinner: 5-6 pm) - fasting - many hours from b'fast - no Ca - stopped Fe since last OV - no PPIs - no multivitamins  - not on Biotin  Reviewed her TFTs: Lab Results  Component Value Date   TSH 4.65 05/26/2021   TSH 10.66 (H) 03/14/2021   TSH 2.51 09/17/2020   TSH 1.24 04/30/2020   TSH 6.12 (H) 03/18/2020   TSH 2.80 10/27/2019   TSH 4.54 (H) 09/13/2019   TSH 3.20 06/27/2019   TSH 6.16 (H) 04/26/2019   TSH 5.38 (H) 03/15/2019   FREET4 0.79 05/26/2021   FREET4 0.61 03/14/2021   FREET4 0.80 09/17/2020   FREET4 0.76 04/30/2020   FREET4 0.72 03/18/2020   FREET4 0.61 10/27/2019   FREET4 0.61 09/13/2019   FREET4 0.72 06/27/2019   FREET4 0.58 (L) 04/26/2019   FREET4 0.66 03/15/2019   T3FREE 3.4 04/26/2019   T3FREE 3.4 03/15/2019   T3FREE 4.1 06/24/2018   T3FREE 3.7 03/11/2018   T3FREE 3.9 01/24/2018   T3FREE 3.7 03/24/2017   T3FREE 3.9 01/15/2017   T3FREE 3.5 12/09/2016   T3FREE 4.5 (H) 10/07/2016   T3FREE 3.9 08/31/2016  11/2018: TSH normal reportedly  Her TSI's were elevated: Lab Results  Component Value Date   TSI 217 (H) 03/14/2021   TSI 418 (H) 03/15/2019   TSI 446 (H) 03/11/2018   TSI  507 (H) 05/01/2016   Pt denies: - feeling nodules in neck - hoarseness - dysphagia - choking - SOB with lying down  She has + FH of thyroid disorders in: mother and sister (both with Graves ds. - s/p RAI tx). No FH of thyroid cancer. No h/o radiation tx to head or neck. No herbal supplements. No Biotin use. No recent steroids use.   Pt. also has a history of HTN, HL, TAH, cholecystectomy.   HPI: + see HPI  I reviewed pt's medications, allergies, PMH, social hx, family hx, and changes were documented in the history of present illness. Otherwise, unchanged  from my initial visit note.  Past Medical History:  Diagnosis Date   Diverticulosis    Dyslipidemia    HTN (hypertension)    Hypothyroid    Skin disorder    Past Surgical History:  Procedure Laterality Date   ABDOMINAL HYSTERECTOMY     BIOPSY  12/24/2020   Procedure: BIOPSY;  Surgeon: Harvel Quale, MD;  Location: [facility];  Service: Gastroenterology;;   CHOLECYSTECTOMY     COLONOSCOPY N/A 09/28/2018   Procedure: COLONOSCOPY;  Surgeon: Danie Binder, MD;  Location: [facility];  Service: Endoscopy;  Laterality: N/A;  2:00pm   ESOPHAGOGASTRODUODENOSCOPY (EGD) WITH PROPOFOL N/A 12/24/2020   Procedure: ESOPHAGOGASTRODUODENOSCOPY (EGD) WITH PROPOFOL;  Surgeon: Harvel Quale, MD;  Location: [facility];  Service: Gastroenterology;  Laterality: N/A;   GIVENS CAPSULE STUDY  12/24/2020   Procedure: GIVENS CAPSULE STUDY;  Surgeon: Harvel Quale, MD;  Location: [facility];  Service: Gastroenterology;;   POLYPECTOMY  09/28/2018   Procedure: POLYPECTOMY;  Surgeon: Danie Binder, MD;  Location: [facility];  Service: Endoscopy;;   TONSILLECTOMY     Social History   Social History   Marital status: Single    Spouse name: N/A   Number of children: 0   Occupational History   retired Pharmacist, hospital Retired   Social History Main Topics   Smoking status: former Smoker - quit 2003   Smokeless tobacco: No        Alcohol use No   Drug use: No   Current Outpatient Medications on File Prior to Visit  Medication Sig Dispense Refill   atorvastatin (LIPITOR) 20 MG tablet Take 20 mg by mouth daily.     furosemide (LASIX) 20 MG tablet Take 20 mg by mouth daily.     Iron, Ferrous Sulfate, 325 (65 Fe) MG TABS Take 325 mg by mouth daily. 30 tablet 2   levothyroxine (SYNTHROID) 75 MCG tablet TAKE 1 TABLET BY MOUTH EVERY DAY BEFORE BREAKFAST 45 tablet 0   losartan (COZAAR) 100 MG tablet Take 100 mg by mouth daily.     metoprolol tartrate (LOPRESSOR) 25 MG  tablet Take 1 tablet (25 mg total) by mouth 2 (two) times daily. 180 tablet 3   Omega-3 Fatty Acids (OMEGA-3 FISH OIL PO) Take by mouth in the morning, at noon, and at bedtime.     No current facility-administered medications on file prior to visit.   No Known Allergies Family History  Problem Relation Age of Onset   AAA (abdominal aortic aneurysm) Mother    Colon polyps Mother    Lung cancer Father    Thyroid disease Sister    Colon cancer Neg Hx    Inflammatory bowel disease Neg Hx    PE: BP 110/66 (BP Location: Left Arm, Patient Position: Sitting, Cuff Size: Large)    Pulse 61  Ht '5\' 2"'$  (1.575 m)    Wt 235 lb 12.8 oz (107 kg)    SpO2 97%    BMI 43.13 kg/m  Wt Readings from Last 3 Encounters:  09/12/21 235 lb 12.8 oz (107 kg)  06/12/21 229 lb 9.6 oz (104.1 kg)  03/14/21 226 lb 6.4 oz (102.7 kg)   Constitutional: overweight, in NAD Eyes: PERRLA, EOMI, no exophthalmos ENT: moist mucous membranes, no thyromegaly, no cervical lymphadenopathy Cardiovascular: RRR, No MRG Respiratory: CTA B Musculoskeletal: no deformities, strength intact in all 4 Skin: moist, warm, no rashes Neurological: no tremor with outstretched hands, DTR normal in all 4  ASSESSMENT: 1. Graves ds.  2.  Acquired hypothyroidism  PLAN:  1.  Graves' disease -Patient with an unusual history of hypothyroidism, followed by thyroid toxicity, confirmed as Graves' disease based on elevated TSI antibodies, absence of nodules on the thyroid ultrasound and protracted course.  She was on methimazole but we were able to stop it completely in 08/2016.  Afterwards, her TSH trended up and we started levothyroxine.  TSH started to decrease again and we stopped levothyroxine 01/2018, after which she again developed hypothyroidism and we restarted levothyroxine, which she continues today -No signs of active Graves' ophthalmopathy: No double vision, eye pain.  She continues to have dry eyes and some blurry vision in the morning.   She sees ophthalmology-no abnormalities found at last visit last year -We reviewed the latest TSI's checked at last visit and they were still elevated but improved -No signs or symptoms of thyrotoxicosis.  She has chronic subjective hyperthermia, stable.  She also had fatigue but she has a history of severe anemia-with a hemoglobin of 7.9.  However, this improved to 13.9 at last check in 02/2021 -We will continue to manage her expectantly  2. Acquired hypothyroidism  - latest thyroid labs reviewed with pt. >> normal after we increase the dose of her levothyroxine: Lab Results  Component Value Date   TSH 4.65 05/26/2021  - she continues on LT4 75 mcg daily (increased at last visit) - pt feels good on this dose. - we discussed about taking the thyroid hormone every day, with water, >30 minutes before breakfast, separated by >4 hours from acid reflux medications, calcium, iron, multivitamins. Pt. is taking it correctly. - will check thyroid tests today: TSH and fT4 - If labs are abnormal, she will need to return for repeat TFTs in 1.5 months -I will see her back in 1 year, but with labs at 6 months  Prefers full tablets, if need increased in dose   Component     Latest Ref Rng & Units 09/12/2021  T4,Free(Direct)     0.60 - 1.60 ng/dL 0.82  TSH     0.35 - 5.50 uIU/mL 4.51  TSH is still close to the upper limit of normal.  I will increase her levothyroxine to 88 mcg daily and repeat the test in 1.5 months.  Philemon Kingdom, MD PhD [facility]

## 2021-10-29 ENCOUNTER — Other Ambulatory Visit (INDEPENDENT_AMBULATORY_CARE_PROVIDER_SITE_OTHER): Payer: Medicare PPO

## 2021-10-29 DIAGNOSIS — E039 Hypothyroidism, unspecified: Secondary | ICD-10-CM

## 2021-10-29 LAB — TSH: TSH: 2.11 u[IU]/mL (ref 0.35–5.50)

## 2021-10-29 LAB — T4, FREE: Free T4: 0.94 ng/dL (ref 0.60–1.60)

## 2021-11-13 DIAGNOSIS — R809 Proteinuria, unspecified: Secondary | ICD-10-CM | POA: Diagnosis not present

## 2021-11-13 DIAGNOSIS — E782 Mixed hyperlipidemia: Secondary | ICD-10-CM | POA: Diagnosis not present

## 2021-11-13 DIAGNOSIS — E039 Hypothyroidism, unspecified: Secondary | ICD-10-CM | POA: Diagnosis not present

## 2021-11-15 DIAGNOSIS — E0500 Thyrotoxicosis with diffuse goiter without thyrotoxic crisis or storm: Secondary | ICD-10-CM | POA: Diagnosis not present

## 2021-11-15 DIAGNOSIS — D500 Iron deficiency anemia secondary to blood loss (chronic): Secondary | ICD-10-CM | POA: Diagnosis not present

## 2021-11-15 DIAGNOSIS — E782 Mixed hyperlipidemia: Secondary | ICD-10-CM | POA: Diagnosis not present

## 2021-11-15 DIAGNOSIS — I10 Essential (primary) hypertension: Secondary | ICD-10-CM | POA: Diagnosis not present

## 2021-11-15 DIAGNOSIS — E039 Hypothyroidism, unspecified: Secondary | ICD-10-CM | POA: Diagnosis not present

## 2021-11-15 DIAGNOSIS — R7303 Prediabetes: Secondary | ICD-10-CM | POA: Diagnosis not present

## 2021-11-15 DIAGNOSIS — R809 Proteinuria, unspecified: Secondary | ICD-10-CM | POA: Diagnosis not present

## 2021-11-15 DIAGNOSIS — J302 Other seasonal allergic rhinitis: Secondary | ICD-10-CM | POA: Diagnosis not present

## 2021-12-09 ENCOUNTER — Other Ambulatory Visit: Payer: Self-pay | Admitting: Internal Medicine

## 2021-12-10 ENCOUNTER — Other Ambulatory Visit (HOSPITAL_COMMUNITY): Payer: Self-pay | Admitting: Internal Medicine

## 2021-12-10 DIAGNOSIS — Z1231 Encounter for screening mammogram for malignant neoplasm of breast: Secondary | ICD-10-CM

## 2021-12-12 ENCOUNTER — Ambulatory Visit (HOSPITAL_COMMUNITY)
Admission: RE | Admit: 2021-12-12 | Discharge: 2021-12-12 | Disposition: A | Discharge status: Home / Self Care | Payer: Medicare PPO | Source: Ambulatory Visit | Attending: Internal Medicine | Admitting: Internal Medicine

## 2021-12-12 DIAGNOSIS — Z1231 Encounter for screening mammogram for malignant neoplasm of breast: Secondary | ICD-10-CM | POA: Diagnosis not present

## 2022-01-01 ENCOUNTER — Ambulatory Visit: Payer: Medicare PPO | Admitting: Cardiology

## 2022-01-01 ENCOUNTER — Encounter: Payer: Self-pay | Admitting: Cardiology

## 2022-01-01 VITALS — BP 124/70 | HR 68 | Ht 62.0 in | Wt 242.0 lb

## 2022-01-01 DIAGNOSIS — I10 Essential (primary) hypertension: Secondary | ICD-10-CM

## 2022-01-01 DIAGNOSIS — I517 Cardiomegaly: Secondary | ICD-10-CM | POA: Diagnosis not present

## 2022-01-01 DIAGNOSIS — R0602 Shortness of breath: Secondary | ICD-10-CM | POA: Diagnosis not present

## 2022-01-01 DIAGNOSIS — R778 Other specified abnormalities of plasma proteins: Secondary | ICD-10-CM

## 2022-01-01 NOTE — Patient Instructions (Signed)
Medication Instructions:  Your physician recommends that you continue on your current medications as directed. Please refer to the Current Medication list given to you today.   Labwork: None  Testing/Procedures: Your physician has requested that you have an echocardiogram. Echocardiography is a painless test that uses sound waves to create images of your heart. It provides your doctor with information about the size and shape of your heart and how well your heart's chambers and valves are working. This procedure takes approximately one hour. There are no restrictions for this procedure.   Follow-Up: Follow up with Dr. Harl Bowie in 6 months.   Any Other Special Instructions Will Be Listed Below (If Applicable).     If you need a refill on your cardiac medications before your next appointment, please call your pharmacy.

## 2022-01-01 NOTE — Progress Notes (Signed)
Clinical Summary Bethany Cunningham is a 71 y.o.female seen today for follow up of the following medical problems.    Elevated troponin - noted during recent admission 12/24/2020 with GI bleed, hypoxia - - 2019 nuclear stress no significant ischemia. - trop up to 741. EKG chronic lateral ST depressions - echo LVEF >75%, elevated dynamic LVOT gradient peak gradient 90 mmHg, severe septal hypertrophy   - no recent chest pains.    2. Dynamic LVOT gradient - looks to be focal basal septal hypertrophy with narrow LVOT, in the setting of a hyperdynamic LV caused by severe anemia she has developed a significant dynamic LVOT gradient   - some DOE withwalking to [employer] - working to stay well hydrated. Compliant with metoprolol      3. GI bleed Status post EGD 6/21 with no acute findings,  - Capsule endoscopy revealed distal small bowel AVMs -GI referred the patient to [facility] tertiary care for push endoscopy procedure. Later referred to [facility]. Had cauterization procedure done with resolution of bleeding anemia.  -Status post 3 unit PRBCs\- Hgb 7.9 at d/c, up to 9.3    4. HTN - she is compliant with meds  5. Hyperlipidemia - labs followed by pcp - she is on statin   Past Medical History:  Diagnosis Date   Diverticulosis    Dyslipidemia    HTN (hypertension)    Hypothyroid    Skin disorder      No Known Allergies   Current Outpatient Medications  Medication Sig Dispense Refill   atorvastatin (LIPITOR) 20 MG tablet Take 20 mg by mouth daily.     furosemide (LASIX) 20 MG tablet Take 20 mg by mouth daily.     levothyroxine (SYNTHROID) 88 MCG tablet TAKE 1 TABLET(88 MCG) BY MOUTH DAILY 45 tablet 3   losartan (COZAAR) 100 MG tablet Take 100 mg by mouth daily.     metoprolol tartrate (LOPRESSOR) 25 MG tablet Take 1 tablet (25 mg total) by mouth 2 (two) times daily. 180 tablet 3   Omega-3 Fatty Acids (OMEGA-3 FISH OIL PO) Take by mouth in the morning, at noon, and at bedtime.      No current facility-administered medications for this visit.     Past Surgical History:  Procedure Laterality Date   ABDOMINAL HYSTERECTOMY     BIOPSY  12/24/2020   Procedure: BIOPSY;  Surgeon: Harvel Quale, MD;  [facility];  Service: Gastroenterology;;   CHOLECYSTECTOMY     COLONOSCOPY N/A 09/28/2018   Procedure: COLONOSCOPY;  Surgeon: Danie Binder, MD;  [facility];  Service: Endoscopy;  Laterality: N/A;  2:00pm   ESOPHAGOGASTRODUODENOSCOPY (EGD) WITH PROPOFOL N/A 12/24/2020   Procedure: ESOPHAGOGASTRODUODENOSCOPY (EGD) WITH PROPOFOL;  Surgeon: Harvel Quale, MD;  [facility];  Service: Gastroenterology;  Laterality: N/A;   GIVENS CAPSULE STUDY  12/24/2020   Procedure: GIVENS CAPSULE STUDY;  Surgeon: Harvel Quale, MD;  [facility];  Service: Gastroenterology;;   POLYPECTOMY  09/28/2018   Procedure: POLYPECTOMY;  Surgeon: Danie Binder, MD;  [facility];  Service: Endoscopy;;   TONSILLECTOMY       No Known Allergies    Family History  Problem Relation Age of Onset   AAA (abdominal aortic aneurysm) Mother    Colon polyps Mother    Lung cancer Father    Thyroid disease Sister    Colon cancer Neg Hx    Inflammatory bowel disease Neg Hx  Social History Bethany Cunningham reports that she quit smoking about 20 years ago. Her smoking use included cigarettes. She has never used smokeless tobacco. Bethany Cunningham reports current alcohol use.   Review of Systems CONSTITUTIONAL: No weight loss, fever, chills, weakness or fatigue.  HEENT: Eyes: No visual loss, blurred vision, double vision or yellow sclerae.No hearing loss, sneezing, congestion, runny nose or sore throat.  SKIN: No rash or itching.  CARDIOVASCULAR: per hpi RESPIRATORY: No shortness of breath, cough or sputum.  GASTROINTESTINAL: No anorexia, nausea, vomiting or diarrhea. No abdominal pain or blood.   GENITOURINARY: No burning on urination, no polyuria NEUROLOGICAL: No headache, dizziness, syncope, paralysis, ataxia, numbness or tingling in the extremities. No change in bowel or bladder control.  MUSCULOSKELETAL: No muscle, back pain, joint pain or stiffness.  LYMPHATICS: No enlarged nodes. No history of splenectomy.  PSYCHIATRIC: No history of depression or anxiety.  ENDOCRINOLOGIC: No reports of sweating, cold or heat intolerance. No polyuria or polydipsia.  Marland Kitchen   Physical Examination Today's Vitals   01/01/22 0819 01/01/22 0908  BP: (!) 142/68 124/70  Pulse: 68   SpO2: 95%   Weight: 242 lb (109.8 kg)   Height: '5\' 2"'$  (1.575 m)    Body mass index is 44.26 kg/m.  Gen: resting comfortably, no acute distress HEENT: no scleral icterus, pupils equal round and reactive, no palptable cervical adenopathy,  CV: RRR, 2/6 systolic murmur rusb, no jvd Resp: Clear to auscultation bilaterally GI: abdomen is soft, non-tender, non-distended, normal bowel sounds, no hepatosplenomegaly MSK: extremities are warm, no edema.  Skin: warm, no rash Neuro:  no focal deficits Psych: appropriate affect      Assessment and Plan  Elevated troponin - likely secondary to severe anemia during admission with GI bleed - no recent symptoms, continue to monitor.    2. Dynamic LVOT gradient - we discussed cMRI to better evaluate. Interestingly she has a metal ring that is stuck on her finger and has not been removed for decades despite multiple attempts, would prohibit MRI -repeat echo, she is on beta blocker and working to maintain hydration. Reassess LVH on repeat echo to better define if this is HOCM or not. Was very hyperdynamic on 12/2020 echo in setting of severe anemia.    3. HTN -at goal based on manual recheck, continue to monitor.       Arnoldo Lenis, M.D.

## 2022-01-09 ENCOUNTER — Ambulatory Visit (INDEPENDENT_AMBULATORY_CARE_PROVIDER_SITE_OTHER): Payer: Medicare PPO

## 2022-01-09 DIAGNOSIS — R0602 Shortness of breath: Secondary | ICD-10-CM | POA: Diagnosis not present

## 2022-01-09 LAB — ECHOCARDIOGRAM COMPLETE
AV Mean grad: 15 mmHg
AV Peak grad: 27.7 mmHg
Ao pk vel: 2.63 m/s
Area-P 1/2: 2.96 cm2
Calc EF: 68.9 %
MV M vel: 5.77 m/s
MV Peak grad: 133.2 mmHg
S' Lateral: 1.93 cm
Single Plane A2C EF: 65.6 %
Single Plane A4C EF: 71.6 %

## 2022-02-10 ENCOUNTER — Telehealth: Payer: Self-pay

## 2022-02-10 NOTE — Telephone Encounter (Signed)
-----   Message from Arnoldo Lenis, MD sent at 02/09/2022 12:45 PM EDT ----- Echo shows heart muscle is thickened which causes some elevated pressure in the heart, similar to prior echo. This elevated preressure can cause some SOB or chest pains at times, if recent symptoms we can increase her metoprolol. Would keep her Dec appt with me  Zandra Abts MD

## 2022-02-10 NOTE — Telephone Encounter (Signed)
Spoke to patient who stated that she only has occasional SOB while outside with physical activity- no chest pain. Patient verbalized understanding of result note and had no questions or concerns at this time.

## 2022-04-14 DIAGNOSIS — R21 Rash and other nonspecific skin eruption: Secondary | ICD-10-CM | POA: Diagnosis not present

## 2022-04-14 DIAGNOSIS — L309 Dermatitis, unspecified: Secondary | ICD-10-CM | POA: Diagnosis not present

## 2022-05-12 DIAGNOSIS — E782 Mixed hyperlipidemia: Secondary | ICD-10-CM | POA: Diagnosis not present

## 2022-05-12 DIAGNOSIS — E039 Hypothyroidism, unspecified: Secondary | ICD-10-CM | POA: Diagnosis not present

## 2022-05-12 DIAGNOSIS — R809 Proteinuria, unspecified: Secondary | ICD-10-CM | POA: Diagnosis not present

## 2022-05-23 DIAGNOSIS — J302 Other seasonal allergic rhinitis: Secondary | ICD-10-CM | POA: Diagnosis not present

## 2022-05-23 DIAGNOSIS — E0500 Thyrotoxicosis with diffuse goiter without thyrotoxic crisis or storm: Secondary | ICD-10-CM | POA: Diagnosis not present

## 2022-05-23 DIAGNOSIS — E039 Hypothyroidism, unspecified: Secondary | ICD-10-CM | POA: Diagnosis not present

## 2022-05-23 DIAGNOSIS — I10 Essential (primary) hypertension: Secondary | ICD-10-CM | POA: Diagnosis not present

## 2022-05-23 DIAGNOSIS — Z23 Encounter for immunization: Secondary | ICD-10-CM | POA: Diagnosis not present

## 2022-05-23 DIAGNOSIS — R7303 Prediabetes: Secondary | ICD-10-CM | POA: Diagnosis not present

## 2022-05-23 DIAGNOSIS — R809 Proteinuria, unspecified: Secondary | ICD-10-CM | POA: Diagnosis not present

## 2022-05-23 DIAGNOSIS — E782 Mixed hyperlipidemia: Secondary | ICD-10-CM | POA: Diagnosis not present

## 2022-05-25 ENCOUNTER — Encounter: Payer: Self-pay | Admitting: Internal Medicine

## 2022-06-14 ENCOUNTER — Other Ambulatory Visit: Payer: Self-pay | Admitting: Cardiology

## 2022-07-03 ENCOUNTER — Ambulatory Visit: Payer: Medicare PPO | Attending: Cardiology | Admitting: Cardiology

## 2022-07-03 ENCOUNTER — Encounter: Payer: Self-pay | Admitting: *Deleted

## 2022-07-03 ENCOUNTER — Encounter: Payer: Self-pay | Admitting: Cardiology

## 2022-07-03 VITALS — BP 136/64 | HR 68 | Ht 62.0 in | Wt 231.2 lb

## 2022-07-03 DIAGNOSIS — I10 Essential (primary) hypertension: Secondary | ICD-10-CM | POA: Diagnosis not present

## 2022-07-03 DIAGNOSIS — I421 Obstructive hypertrophic cardiomyopathy: Secondary | ICD-10-CM | POA: Diagnosis not present

## 2022-07-03 DIAGNOSIS — E782 Mixed hyperlipidemia: Secondary | ICD-10-CM | POA: Diagnosis not present

## 2022-07-03 NOTE — Patient Instructions (Addendum)

## 2022-07-03 NOTE — Progress Notes (Signed)
Clinical Summary Ms. Nakata is a 71 y.o.female seen today for follow up of the following medical problems.    Elevated troponin - noted during recent admission 12/24/2020 with GI bleed, hypoxia - - 2019 nuclear stress no significant ischemia. - trop up to 741. EKG chronic lateral ST depressions - echo LVEF >75%, elevated dynamic LVOT gradient peak gradient 90 mmHg, severe septal hypertrophy        2. Dynamic LVOT gradient - looks to be focal basal septal hypertrophy with narrow LVOT, in the setting of a hyperdynamic LV caused by severe anemia she has developed a significant dynamic LVOT gradient  01/2022 echo: LVEF >75%, peak gradient 98 mmHg, chordal SAM. Septal and posterior wall reported at 1.7 cm.   - infrequent SOB, no chest pains or presyncope/syncope     3. GI bleed Status post EGD 6/21 with no acute findings,  - Capsule endoscopy revealed distal small bowel AVMs -GI referred the patient to [facility] tertiary care for push endoscopy procedure. Later referred to [facility]. Had cauterization procedure done with resolution of bleeding anemia.  -Status post 3 unit PRBCs\- Hgb 7.9 at d/c, up to 9.3   - no recent issues/ 05/2022 HGB was 14   4. HTN - compliant with meds   5. Hyperlipidemia - labs followed by pcp - 05/2022 LDL 64 Past Medical History:  Diagnosis Date   Diverticulosis    Dyslipidemia    HTN (hypertension)    Hypothyroid    Skin disorder      No Known Allergies   Current Outpatient Medications  Medication Sig Dispense Refill   atorvastatin (LIPITOR) 20 MG tablet Take 20 mg by mouth daily.     furosemide (LASIX) 20 MG tablet Take 20 mg by mouth daily.     levothyroxine (SYNTHROID) 88 MCG tablet TAKE 1 TABLET(88 MCG) BY MOUTH DAILY 45 tablet 3   losartan (COZAAR) 100 MG tablet Take 100 mg by mouth daily.     metoprolol tartrate (LOPRESSOR) 25 MG tablet TAKE 1 TABLET(25 MG) BY MOUTH TWICE DAILY 180 tablet 3   Omega-3 Fatty Acids (OMEGA-3 FISH  OIL PO) Take by mouth in the morning, at noon, and at bedtime.     No current facility-administered medications for this visit.     Past Surgical History:  Procedure Laterality Date   ABDOMINAL HYSTERECTOMY     BIOPSY  12/24/2020   Procedure: BIOPSY;  Surgeon: Harvel Quale, MD;  [facility];  Service: Gastroenterology;;   CHOLECYSTECTOMY     COLONOSCOPY N/A 09/28/2018   Procedure: COLONOSCOPY;  Surgeon: Danie Binder, MD;  [facility];  Service: Endoscopy;  Laterality: N/A;  2:00pm   ESOPHAGOGASTRODUODENOSCOPY (EGD) WITH PROPOFOL N/A 12/24/2020   Procedure: ESOPHAGOGASTRODUODENOSCOPY (EGD) WITH PROPOFOL;  Surgeon: Harvel Quale, MD;  [facility];  Service: Gastroenterology;  Laterality: N/A;   GIVENS CAPSULE STUDY  12/24/2020   Procedure: GIVENS CAPSULE STUDY;  Surgeon: Harvel Quale, MD;  [facility];  Service: Gastroenterology;;   POLYPECTOMY  09/28/2018   Procedure: POLYPECTOMY;  Surgeon: Danie Binder, MD;  [facility];  Service: Endoscopy;;   TONSILLECTOMY       No Known Allergies    Family History  Problem Relation Age of Onset   AAA (abdominal aortic aneurysm) Mother    Colon polyps Mother    Lung cancer Father    Thyroid disease Sister    Colon cancer  Neg Hx    Inflammatory bowel disease Neg Hx      Social History Ms. Bahr reports that she quit smoking about 21 years ago. Her smoking use included cigarettes. She has never used smokeless tobacco. Ms. Derocher reports current alcohol use.   Review of Systems CONSTITUTIONAL: No weight loss, fever, chills, weakness or fatigue.  HEENT: Eyes: No visual loss, blurred vision, double vision or yellow sclerae.No hearing loss, sneezing, congestion, runny nose or sore throat.  SKIN: No rash or itching.  CARDIOVASCULAR: per hpi RESPIRATORY: No shortness of breath, cough or sputum.  GASTROINTESTINAL: No anorexia, nausea,  vomiting or diarrhea. No abdominal pain or blood.  GENITOURINARY: No burning on urination, no polyuria NEUROLOGICAL: No headache, dizziness, syncope, paralysis, ataxia, numbness or tingling in the extremities. No change in bowel or bladder control.  MUSCULOSKELETAL: No muscle, back pain, joint pain or stiffness.  LYMPHATICS: No enlarged nodes. No history of splenectomy.  PSYCHIATRIC: No history of depression or anxiety.  ENDOCRINOLOGIC: No reports of sweating, cold or heat intolerance. No polyuria or polydipsia.  Marland Kitchen   Physical Examination Today's Vitals   07/03/22 0910  BP: 136/64  Pulse: 68  SpO2: 95%  Weight: 231 lb 3.2 oz (104.9 kg)  Height: '5\' 2"'$  (1.575 m)   Body mass index is 42.29 kg/m.  Gen: resting comfortably, no acute distress HEENT: no scleral icterus, pupils equal round and reactive, no palptable cervical adenopathy,  CV: per hpi Resp: Clear to auscultation bilaterally GI: abdomen is soft, non-tender, non-distended, normal bowel sounds, no hepatosplenomegaly MSK: extremities are warm, no edema.  Skin: warm, no rash Neuro:  no focal deficits Psych: appropriate affect   Diagnostic Studies 01/2022 echo 1. Hypertrophic obstructive cardiomyopathy.   2. Peak left ventricular outflow velocity 5 m/s, peak gradient 98 mmHg.Marland Kitchen  Left ventricular ejection fraction, by estimation, is >75%. The left  ventricle has hyperdynamic function. The left ventricle has no regional  wall motion abnormalities. There is  severe concentric left ventricular hypertrophy. Left ventricular diastolic  parameters are consistent with Grade I diastolic dysfunction (impaired  relaxation). The average left ventricular global longitudinal strain is  -17.8 %. The global longitudinal  strain is normal.   3. Right ventricular systolic function is normal. The right ventricular  size is normal.   4. Left atrial size was mildly dilated.   5. Chordal SAM (systolic motion). The mitral valve is normal in   structure. Trivial mitral valve regurgitation. No evidence of mitral  stenosis. Moderate mitral annular calcification.   6. The aortic valve is tricuspid. Aortic valve regurgitation is not  visualized. No aortic stenosis is present.   7. The inferior vena cava is normal in size with greater than 50%  respiratory variability, suggesting right atrial pressure of 3 mmHg.     Assessment and Plan  1. Dynamic LVOT gradient/hypertophic CM - we discussed cMRI to better evaluate. Interestingly she has a metal ring that is stuck on her finger and has not been removed for decades despite multiple attempts. Reports made of platinum so perhaps could consider cMRI at some point if can be cleared  - no symptoms, continue aggressive hydration and beta blocker - at 71 has not had AICD risk factors   2. HTN -at goal, continue current meds  3. Hyperlipidemia - LDL at goal, continue atorvastatin. Discussed ditary changes to further lower TGs     Arnoldo Lenis, M.D.

## 2022-08-28 ENCOUNTER — Telehealth: Payer: Self-pay

## 2022-08-28 NOTE — Telephone Encounter (Signed)
Fax request came in for Levothyroxine 18mg but your note says 724m. Please verify which dose should be sent

## 2022-08-31 MED ORDER — LEVOTHYROXINE SODIUM 88 MCG PO TABS: ORAL_TABLET | ORAL | 3 refills | Status: DC

## 2022-08-31 NOTE — Telephone Encounter (Signed)
Done

## 2022-09-01 NOTE — Progress Notes (Signed)
Patient ID: Bethany Cunningham, female   DOB: 1950/12/26, 72 y.o.   MRN: [id number]   HPI  Bethany Cunningham is a 72 y.o.-year-old female, returning for f/u for history of Graves ds and acquired hypothyroidism. Last visit 1 year ago.  Interim history: She continues to have red, itchy eyes, and blurry vision in am. No active GO per ophthalmology -has regular eye exams. No heat intolerance, tremors.  Reviewed and addended history: Pt. has been dx with hypothyroidism in approximately 2014 >> on Levothyroxine 112 mcg >> TSH decreased >> dose of LT4 repeatedly decreased >> stopped 05-12/2015. Despite this, TSH remained suppressed.  03/06/2016: TSH 0.07, free T4 1.1 (0.8-1.8) 01/30/2016: TSH 0.11, free T4 0.9  Patient had a thyroid ultrasound on 03/02/2016 that showed a heterogeneous thyroid, without nodules.  Her Graves' antibodies were elevated.  We initially started methimazole 5 mg daily and decrease the dose to 2.5 mg daily in 06/2016.  She was able to come off methimazole in 08/2016.    However, her TSH increased again and free T4 decreased to lower than normal.  We started low-dose levothyroxine, 25 mcg daily, but subsequent TFTs have been thyrotoxic so we ended up stopping levothyroxine 01/24/2018.  In 03/2019, her TSH was elevated and this was confirmed a month later.  Therefore, we ended up starting levothyroxine 12.5 mg daily.  In 09/2019: We increased levothyroxine to 25 mcg daily.  In 03/2020: We increased levothyroxine to 50 mcg daily.  In 03/2021: We increased levothyroxine to 75 mcg daily.  In 09/2021, we increased levothyroxine to 88 mcg daily.  She is currently on levothyroxine 88 mcg daily: - still around 1 am (early dinner: 5-6 pm) - fasting - many hours from b'fast - no Ca - stopped Fe - no PPIs - no multivitamins  - not on Biotin  Reviewed her TFTs: 05/12/2022: TSH 1.91 11/13/2021: TSH 2.86 Lab Results  Component Value Date   TSH 2.11 10/29/2021   TSH 4.51  09/12/2021   TSH 4.65 05/26/2021   TSH 10.66 (H) 03/14/2021   TSH 2.51 09/17/2020   TSH 1.24 04/30/2020   TSH 6.12 (H) 03/18/2020   TSH 2.80 10/27/2019   TSH 4.54 (H) 09/13/2019   TSH 3.20 06/27/2019   FREET4 0.94 10/29/2021   FREET4 0.82 09/12/2021   FREET4 0.79 05/26/2021   FREET4 0.61 03/14/2021   FREET4 0.80 09/17/2020   FREET4 0.76 04/30/2020   FREET4 0.72 03/18/2020   FREET4 0.61 10/27/2019   FREET4 0.61 09/13/2019   FREET4 0.72 06/27/2019   T3FREE 3.4 04/26/2019   T3FREE 3.4 03/15/2019   T3FREE 4.1 06/24/2018   T3FREE 3.7 03/11/2018   T3FREE 3.9 01/24/2018   T3FREE 3.7 03/24/2017   T3FREE 3.9 01/15/2017   T3FREE 3.5 12/09/2016   T3FREE 4.5 (H) 10/07/2016   T3FREE 3.9 08/31/2016  11/2018: TSH normal reportedly  Her TSI's were elevated: Lab Results  Component Value Date   TSI 217 (H) 03/14/2021   TSI 418 (H) 03/15/2019   TSI 446 (H) 03/11/2018   TSI 507 (H) 05/01/2016   Pt denies: - feeling nodules in neck - hoarseness - dysphagia - choking  She has + FH of thyroid disorders in: mother and sister (both with Graves ds. - s/p RAI tx). No FH of thyroid cancer. No h/o radiation tx to head or neck. No herbal supplements. No Biotin use. No recent steroids use.   Pt. also has a history of HTN, HL, TAH, cholecystectomy.   She has a  history of fatigue due to anemia in the setting of upper GI bleed (multiple small bowel AVMs).  She had AVM cauterization at [facility].  No more bleeding afterwards.  She came off iron supplements afterwards. Also, prediabetes: 05/12/2022: HbA1c 5.9% 11/13/2021: HbA1c 6.1%  HPI: + see HPI  I reviewed pt's medications, allergies, PMH, social hx, family hx, and changes were documented in the history of present illness. Otherwise, unchanged from my initial visit note.  Past Medical History:  Diagnosis Date   Diverticulosis    Dyslipidemia    HTN (hypertension)    Hypothyroid    Skin disorder    Past Surgical History:  Procedure  Laterality Date   ABDOMINAL HYSTERECTOMY     BIOPSY  12/24/2020   Procedure: BIOPSY;  Surgeon: Harvel Quale, MD;  [facility];  Service: Gastroenterology;;   CHOLECYSTECTOMY     COLONOSCOPY N/A 09/28/2018   Procedure: COLONOSCOPY;  Surgeon: Danie Binder, MD;  [facility];  Service: Endoscopy;  Laterality: N/A;  2:00pm   ESOPHAGOGASTRODUODENOSCOPY (EGD) WITH PROPOFOL N/A 12/24/2020   Procedure: ESOPHAGOGASTRODUODENOSCOPY (EGD) WITH PROPOFOL;  Surgeon: Harvel Quale, MD;  [facility];  Service: Gastroenterology;  Laterality: N/A;   GIVENS CAPSULE STUDY  12/24/2020   Procedure: GIVENS CAPSULE STUDY;  Surgeon: Harvel Quale, MD;  [facility];  Service: Gastroenterology;;   POLYPECTOMY  09/28/2018   Procedure: POLYPECTOMY;  Surgeon: Danie Binder, MD;  [facility];  Service: Endoscopy;;   TONSILLECTOMY     Social History   Social History   Marital status: Single    Spouse name: N/A   Number of children: 0   Occupational History   retired Pharmacist, hospital Retired   Social History Main Topics   Smoking status: former Smoker - quit 2003   Smokeless tobacco: No        Alcohol use No   Drug use: No   Current Outpatient Medications on File Prior to Visit  Medication Sig Dispense Refill   atorvastatin (LIPITOR) 20 MG tablet Take 20 mg by mouth daily.     furosemide (LASIX) 20 MG tablet Take 20 mg by mouth daily.     levothyroxine (SYNTHROID) 88 MCG tablet TAKE 1 TABLET(88 MCG) BY MOUTH DAILY 45 tablet 3   losartan (COZAAR) 100 MG tablet Take 100 mg by mouth daily.     metoprolol tartrate (LOPRESSOR) 25 MG tablet TAKE 1 TABLET(25 MG) BY MOUTH TWICE DAILY 180 tablet 3   Omega-3 Fatty Acids (OMEGA-3 FISH OIL PO) Take by mouth in the morning, at noon, and at bedtime. (Patient not taking: Reported on 07/03/2022)     No current facility-administered medications on file prior to visit.   No Known  Allergies Family History  Problem Relation Age of Onset   AAA (abdominal aortic aneurysm) Mother    Colon polyps Mother    Lung cancer Father    Thyroid disease Sister    Colon cancer Neg Hx    Inflammatory bowel disease Neg Hx    PE: BP 114/76 (BP Location: Left Arm, Patient Position: Sitting, Cuff Size: Large)   Pulse 67   Ht '5\' 2"'$  (1.575 m)   Wt 234 lb (106.1 kg)   SpO2 97%   BMI 42.80 kg/m  Wt Readings from Last 3 Encounters:  09/02/22 234 lb (106.1 kg)  07/03/22 231 lb 3.2 oz (104.9 kg)  01/01/22 242 lb (109.8 kg)   Constitutional: overweight, in NAD Eyes:  EOMI, no exophthalmos ENT: no neck masses, no cervical lymphadenopathy Cardiovascular: RRR, No MRG Respiratory: CTA B Musculoskeletal: no deformities Skin:no rashes Neurological: no tremor with outstretched hands  ASSESSMENT: 1. Graves ds.  2.  Acquired hypothyroidism  PLAN:  1.  Graves' disease -Patient with an unusual history of hypothyroidism, followed by thyroid toxicity, confirmed as Graves' disease based on elevated TSI antibodies, absence of nodules on the thyroid ultrasound and protracted course.  She was on methimazole but we were able to stop it completely in 08/2016.  Afterwards, her TSH trended up and we started levothyroxine.  TSH started to decrease again and we stopped levothyroxine 01/2018, after which she again developed hypothyroidism, and we restarted levothyroxine, which she continues today -No signs of active Graves' ophthalmopathy: No double vision, eye pain, chemosis.  She continues to have red, itchy eyes and some blurry vision in the morning.  She sees ophthalmology and mentions that they are not concerned about Graves' ophthalmopathy.  At today's visit, we discussed that the symptoms could be related to simple dry eyes.  I recommended Celluvisc Refresh eyedrops. -Reviewed the latest TSI level-still elevated, but improved: Lab Results  Component Value Date   TSI 217 (H) 03/14/2021  -Will  recheck this today -No signs or symptoms of thyrotoxicosis.  She has chronic subjective hyperthermia, which is stable at.  She also had fatigue but she has a history of severe anemia-with a hemoglobin of 7.9.  However this improved to normal. -We continue to manage her expectantly  2. Acquired hypothyroidism  - latest thyroid labs reviewed with pt. >> normal in 05/2022-see above - she continues on LT4 88 mcg daily, dose increased at last visit - pt feels good on this dose. - we discussed about taking the thyroid hormone every day, with water, >30 minutes before breakfast, separated by >4 hours from acid reflux medications, calcium, iron, multivitamins. Pt. is taking it correctly. - will check thyroid tests today: TSH and fT4 - If labs are abnormal, she will need to return for repeat TFTs in 1.5 months  Prefers full tablets, if need increased in dose.  Component     Latest Ref Rng 09/02/2022  T4,Free(Direct)     0.60 - 1.60 ng/dL 0.84   TSH     0.35 - 5.50 uIU/mL 4.41   TSI     <140 % baseline 156 (H)     Thyroid tests are normal.  We can continue the same dose of levothyroxine.  Her TSI's are much improved.  Philemon Kingdom, MD PhD [facility] Endocrinology

## 2022-09-02 ENCOUNTER — Encounter: Payer: Self-pay | Admitting: Internal Medicine

## 2022-09-02 ENCOUNTER — Ambulatory Visit: Payer: Medicare PPO | Admitting: Internal Medicine

## 2022-09-02 VITALS — BP 114/76 | HR 67 | Ht 62.0 in | Wt 234.0 lb

## 2022-09-02 DIAGNOSIS — E0500 Thyrotoxicosis with diffuse goiter without thyrotoxic crisis or storm: Secondary | ICD-10-CM | POA: Diagnosis not present

## 2022-09-02 DIAGNOSIS — E039 Hypothyroidism, unspecified: Secondary | ICD-10-CM | POA: Diagnosis not present

## 2022-09-02 LAB — T4, FREE: Free T4: 0.84 ng/dL (ref 0.60–1.60)

## 2022-09-02 LAB — TSH: TSH: 4.41 u[IU]/mL (ref 0.35–5.50)

## 2022-09-02 NOTE — Patient Instructions (Addendum)
Please continue levothyroxine 88 mcg daily.  Take the thyroid hormone every day, with water, at least 30 minutes before breakfast, separated by at least 4 hours from: - acid reflux medications - calcium - iron - multivitamins  Please stop at the lab.  Try Celluvisc eye drops.  Please come back for a follow-up appointment in 1 year.

## 2022-09-04 LAB — THYROID STIMULATING IMMUNOGLOBULIN: TSI: 156 % baseline — ABNORMAL HIGH (ref <140)

## 2022-11-25 DIAGNOSIS — R809 Proteinuria, unspecified: Secondary | ICD-10-CM | POA: Diagnosis not present

## 2022-11-25 DIAGNOSIS — E039 Hypothyroidism, unspecified: Secondary | ICD-10-CM | POA: Diagnosis not present

## 2022-11-25 DIAGNOSIS — E782 Mixed hyperlipidemia: Secondary | ICD-10-CM | POA: Diagnosis not present

## 2022-12-05 DIAGNOSIS — E782 Mixed hyperlipidemia: Secondary | ICD-10-CM | POA: Diagnosis not present

## 2022-12-05 DIAGNOSIS — R7303 Prediabetes: Secondary | ICD-10-CM | POA: Diagnosis not present

## 2022-12-05 DIAGNOSIS — Z0000 Encounter for general adult medical examination without abnormal findings: Secondary | ICD-10-CM | POA: Diagnosis not present

## 2022-12-05 DIAGNOSIS — E039 Hypothyroidism, unspecified: Secondary | ICD-10-CM | POA: Diagnosis not present

## 2022-12-05 DIAGNOSIS — Z0001 Encounter for general adult medical examination with abnormal findings: Secondary | ICD-10-CM | POA: Diagnosis not present

## 2022-12-05 DIAGNOSIS — Z6841 Body Mass Index (BMI) 40.0 and over, adult: Secondary | ICD-10-CM | POA: Diagnosis not present

## 2022-12-05 DIAGNOSIS — I10 Essential (primary) hypertension: Secondary | ICD-10-CM | POA: Diagnosis not present

## 2022-12-05 DIAGNOSIS — R809 Proteinuria, unspecified: Secondary | ICD-10-CM | POA: Diagnosis not present

## 2023-01-10 ENCOUNTER — Other Ambulatory Visit: Payer: Self-pay | Admitting: Internal Medicine

## 2023-04-07 ENCOUNTER — Other Ambulatory Visit: Payer: Self-pay | Admitting: Cardiology

## 2023-04-12 DIAGNOSIS — H52203 Unspecified astigmatism, bilateral: Secondary | ICD-10-CM | POA: Diagnosis not present

## 2023-04-12 DIAGNOSIS — H2513 Age-related nuclear cataract, bilateral: Secondary | ICD-10-CM | POA: Diagnosis not present

## 2023-04-12 DIAGNOSIS — H5203 Hypermetropia, bilateral: Secondary | ICD-10-CM | POA: Diagnosis not present

## 2023-05-18 DIAGNOSIS — R051 Acute cough: Secondary | ICD-10-CM | POA: Diagnosis not present

## 2023-05-18 DIAGNOSIS — Z03818 Encounter for observation for suspected exposure to other biological agents ruled out: Secondary | ICD-10-CM | POA: Diagnosis not present

## 2023-06-09 DIAGNOSIS — E782 Mixed hyperlipidemia: Secondary | ICD-10-CM | POA: Diagnosis not present

## 2023-06-09 DIAGNOSIS — R809 Proteinuria, unspecified: Secondary | ICD-10-CM | POA: Diagnosis not present

## 2023-06-09 DIAGNOSIS — E039 Hypothyroidism, unspecified: Secondary | ICD-10-CM | POA: Diagnosis not present

## 2023-06-11 DIAGNOSIS — J302 Other seasonal allergic rhinitis: Secondary | ICD-10-CM | POA: Diagnosis not present

## 2023-06-11 DIAGNOSIS — I10 Essential (primary) hypertension: Secondary | ICD-10-CM | POA: Diagnosis not present

## 2023-06-11 DIAGNOSIS — E0500 Thyrotoxicosis with diffuse goiter without thyrotoxic crisis or storm: Secondary | ICD-10-CM | POA: Diagnosis not present

## 2023-06-11 DIAGNOSIS — Z0000 Encounter for general adult medical examination without abnormal findings: Secondary | ICD-10-CM | POA: Diagnosis not present

## 2023-06-11 DIAGNOSIS — R7303 Prediabetes: Secondary | ICD-10-CM | POA: Diagnosis not present

## 2023-06-11 DIAGNOSIS — E782 Mixed hyperlipidemia: Secondary | ICD-10-CM | POA: Diagnosis not present

## 2023-06-11 DIAGNOSIS — E039 Hypothyroidism, unspecified: Secondary | ICD-10-CM | POA: Diagnosis not present

## 2023-06-11 DIAGNOSIS — R809 Proteinuria, unspecified: Secondary | ICD-10-CM | POA: Diagnosis not present

## 2023-07-07 ENCOUNTER — Other Ambulatory Visit: Payer: Self-pay | Admitting: Internal Medicine

## 2023-07-30 ENCOUNTER — Ambulatory Visit: Payer: Medicare PPO | Attending: Cardiology | Admitting: Cardiology

## 2023-07-30 ENCOUNTER — Encounter: Payer: Self-pay | Admitting: Cardiology

## 2023-07-30 VITALS — BP 122/70 | HR 62 | Ht 62.0 in | Wt 220.8 lb

## 2023-07-30 DIAGNOSIS — I421 Obstructive hypertrophic cardiomyopathy: Secondary | ICD-10-CM

## 2023-07-30 DIAGNOSIS — E782 Mixed hyperlipidemia: Secondary | ICD-10-CM | POA: Diagnosis not present

## 2023-07-30 DIAGNOSIS — I10 Essential (primary) hypertension: Secondary | ICD-10-CM

## 2023-07-30 NOTE — Patient Instructions (Signed)
Medication Instructions:   Continue all current medications.   Labwork:  none  Testing/Procedures:  Your physician has requested that you have an echocardiogram. Echocardiography is a painless test that uses sound waves to create images of your heart. It provides your doctor with information about the size and shape of your heart and how well your heart's chambers and valves are working. This procedure takes approximately one hour. There are no restrictions for this procedure. Please do NOT wear cologne, perfume, aftershave, or lotions (deodorant is allowed). Please arrive 15 minutes prior to your appointment time.  Please note: We ask at that you not bring children with you during ultrasound (echo/ vascular) testing. Due to room size and safety concerns, children are not allowed in the ultrasound rooms during exams. Our front office staff cannot provide observation of children in our lobby area while testing is being conducted. An adult accompanying a patient to their appointment will only be allowed in the ultrasound room at the discretion of the ultrasound technician under special circumstances. We apologize for any inconvenience.  Office will contact with results via phone, letter or mychart.     Follow-Up:  6 months   Any Other Special Instructions Will Be Listed Below (If Applicable).   If you need a refill on your cardiac medications before your next appointment, please call your pharmacy.

## 2023-07-30 NOTE — Progress Notes (Signed)
Clinical Summary Bethany Cunningham is a 73 y.o.female seen today for follow up of the following medical problems.    Elevated troponin - noted during prior admission 12/24/2020 with GI bleed, hypoxia - - 2019 nuclear stress no significant ischemia. - trop up to 741. EKG chronic lateral ST depressions - echo LVEF >75%, elevated dynamic LVOT gradient peak gradient 90 mmHg, severe septal hypertrophy           2. Dynamic LVOT gradient/Hypetrtrophic CM - looks to be focal basal septal hypertrophy with narrow LVOT, in the setting of a hyperdynamic LV caused by severe anemia she has developed a significant dynamic LVOT gradient  01/2022 echo: LVEF >75%, peak gradient 98 mmHg, chordal SAM. Septal and posterior wall reported at 1.7 cm.    - no SOB/DOE, no chest pains - working to stay well hydrated, compliant with metoprolol       3. GI bleed Status post EGD 6/21 with no acute findings,  - Capsule endoscopy revealed distal small bowel AVMs -GI referred the patient to [facility] tertiary care for push endoscopy procedure. Later referred to [facility]. Had cauterization procedure done with resolution of bleeding anemia.  -Status post 3 unit PRBCs\- Hgb 7.9 at d/c, up to 9.3  - no recent issues, Hgb has been around 14 by last check   4. HTN - compliant with meds   5. Hyperlipidemia - labs followed by pcp - 06/2203 TC 142 TG 183 HDL 34 LDL 77 Past Medical History:  Diagnosis Date   Diverticulosis    Dyslipidemia    HTN (hypertension)    Hypothyroid    Skin disorder      No Known Allergies   Current Outpatient Medications  Medication Sig Dispense Refill   atorvastatin (LIPITOR) 20 MG tablet Take 20 mg by mouth daily.     furosemide (LASIX) 20 MG tablet Take 20 mg by mouth daily.     levothyroxine (SYNTHROID) 88 MCG tablet TAKE 1 TABLET(88 MCG) BY MOUTH DAILY 60 tablet 1   losartan (COZAAR) 100 MG tablet Take 100 mg by mouth daily.     metoprolol tartrate (LOPRESSOR) 25 MG  tablet TAKE 1 TABLET(25 MG) BY MOUTH TWICE DAILY 180 tablet 3   Omega-3 Fatty Acids (OMEGA-3 FISH OIL PO) Take by mouth in the morning, at noon, and at bedtime. (Patient not taking: Reported on 07/03/2022)     No current facility-administered medications for this visit.     Past Surgical History:  Procedure Laterality Date   ABDOMINAL HYSTERECTOMY     BIOPSY  12/24/2020   Procedure: BIOPSY;  Surgeon: Dolores Frame, MD;  [facility];  Service: Gastroenterology;;   CHOLECYSTECTOMY     COLONOSCOPY N/A 09/28/2018   Procedure: COLONOSCOPY;  Surgeon: West Bali, MD;  [facility];  Service: Endoscopy;  Laterality: N/A;  2:00pm   ESOPHAGOGASTRODUODENOSCOPY (EGD) WITH PROPOFOL N/A 12/24/2020   Procedure: ESOPHAGOGASTRODUODENOSCOPY (EGD) WITH PROPOFOL;  Surgeon: Dolores Frame, MD;  [facility];  Service: Gastroenterology;  Laterality: N/A;   GIVENS CAPSULE STUDY  12/24/2020   Procedure: GIVENS CAPSULE STUDY;  Surgeon: Dolores Frame, MD;  [facility];  Service: Gastroenterology;;   POLYPECTOMY  09/28/2018   Procedure: POLYPECTOMY;  Surgeon: West Bali, MD;  [facility];  Service: Endoscopy;;   TONSILLECTOMY       No Known Allergies    Family History  Problem Relation Age of Onset  AAA (abdominal aortic aneurysm) Mother    Colon polyps Mother    Lung cancer Father    Thyroid disease Sister    Colon cancer Neg Hx    Inflammatory bowel disease Neg Hx      Social History Bethany Cunningham reports that she quit smoking about 22 years ago. Her smoking use included cigarettes. She has never used smokeless tobacco. Bethany Cunningham reports current alcohol use.   Review of Systems CONSTITUTIONAL: No weight loss, fever, chills, weakness or fatigue.  HEENT: Eyes: No visual loss, blurred vision, double vision or yellow sclerae.No hearing loss, sneezing, congestion, runny nose or sore throat.   SKIN: No rash or itching.  CARDIOVASCULAR: per hpi RESPIRATORY: No shortness of breath, cough or sputum.  GASTROINTESTINAL: No anorexia, nausea, vomiting or diarrhea. No abdominal pain or blood.  GENITOURINARY: No burning on urination, no polyuria NEUROLOGICAL: No headache, dizziness, syncope, paralysis, ataxia, numbness or tingling in the extremities. No change in bowel or bladder control.  MUSCULOSKELETAL: No muscle, back pain, joint pain or stiffness.  LYMPHATICS: No enlarged nodes. No history of splenectomy.  PSYCHIATRIC: No history of depression or anxiety.  ENDOCRINOLOGIC: No reports of sweating, cold or heat intolerance. No polyuria or polydipsia.  Marland Kitchen   Physical Examination Today's Vitals   07/30/23 0801  BP: 122/70  Pulse: 62  SpO2: 96%  Weight: 220 lb 12.8 oz (100.2 kg)  Height: 5\' 2"  (1.575 m)   Body mass index is 40.38 kg/m.  Gen: resting comfortably, no acute distress HEENT: no scleral icterus, pupils equal round and reactive, no palptable cervical adenopathy,  CV: RRR, 2/6 systolic murmur rusb, no jvd Resp: Clear to auscultation bilaterally GI: abdomen is soft, non-tender, non-distended, normal bowel sounds, no hepatosplenomegaly MSK: extremities are warm, no edema.  Skin: warm, no rash Neuro:  no focal deficits Psych: appropriate affect   Diagnostic Studies 01/2022 echo 1. Hypertrophic obstructive cardiomyopathy.   2. Peak left ventricular outflow velocity 5 m/s, peak gradient 98 mmHg.Marland Kitchen  Left ventricular ejection fraction, by estimation, is >75%. The left  ventricle has hyperdynamic function. The left ventricle has no regional  wall motion abnormalities. There is  severe concentric left ventricular hypertrophy. Left ventricular diastolic  parameters are consistent with Grade I diastolic dysfunction (impaired  relaxation). The average left ventricular global longitudinal strain is  -17.8 %. The global longitudinal  strain is normal.   3. Right ventricular  systolic function is normal. The right ventricular  size is normal.   4. Left atrial size was mildly dilated.   5. Chordal SAM (systolic motion). The mitral valve is normal in  structure. Trivial mitral valve regurgitation. No evidence of mitral  stenosis. Moderate mitral annular calcification.   6. The aortic valve is tricuspid. Aortic valve regurgitation is not  visualized. No aortic stenosis is present.   7. The inferior vena cava is normal in size with greater than 50%  respiratory variability, suggesting right atrial pressure of 3 mmHg.           Assessment and Plan   1. Dynamic LVOT gradient/hypertophic CM - we discussed cMRI to better evaluate. Interestingly she has a metal ring that is stuck on her finger and has not been removed for decades despite multiple attempts.  - no recent symptoms - we will update echo to reassess - at 88 has not had AICD risk factors   2. HTN -she is at goal, continue current meds   3. Hyperlipidemia - at goal, continue current meds  EKG today shows NSR  Antoine Poche, M.D.

## 2023-08-11 ENCOUNTER — Ambulatory Visit: Payer: Medicare PPO | Attending: Cardiology

## 2023-08-11 DIAGNOSIS — I421 Obstructive hypertrophic cardiomyopathy: Secondary | ICD-10-CM

## 2023-08-11 LAB — ECHOCARDIOGRAM COMPLETE
AV Mean grad: 8 mm[Hg]
AV Peak grad: 15.2 mm[Hg]
Ao pk vel: 1.95 m/s
Area-P 1/2: 2.06 cm2
Calc EF: 81.1 %
S' Lateral: 2.3 cm
Single Plane A2C EF: 76 %
Single Plane A4C EF: 83.6 %

## 2023-08-11 MED ORDER — PERFLUTREN LIPID MICROSPHERE
1.0000 mL | INTRAVENOUS | Status: AC | PRN
  Administered 2023-08-11: 4 mL via INTRAVENOUS

## 2023-08-31 ENCOUNTER — Telehealth: Payer: Self-pay | Admitting: *Deleted

## 2023-08-31 NOTE — Telephone Encounter (Signed)
-----   Message from [employer] Irving B sent at 08/31/2023  8:48 AM EST -----  ----- Message ----- From: Antoine Poche, MD Sent: 08/31/2023   8:46 AM EST To: Roseanne Reno, CMA  Echo shows overall stable findings, heart muscle thickness has not significantly increased. Heart function remains normal  Dominga Ferry MD

## 2023-08-31 NOTE — Telephone Encounter (Signed)
 Notified, copy to pcp.

## 2023-09-03 ENCOUNTER — Encounter: Payer: Self-pay | Admitting: Internal Medicine

## 2023-09-03 ENCOUNTER — Ambulatory Visit: Payer: Medicare PPO | Admitting: Internal Medicine

## 2023-09-03 VITALS — BP 138/86 | HR 67 | Ht 62.0 in | Wt 221.6 lb

## 2023-09-03 DIAGNOSIS — E039 Hypothyroidism, unspecified: Secondary | ICD-10-CM | POA: Diagnosis not present

## 2023-09-03 DIAGNOSIS — E0500 Thyrotoxicosis with diffuse goiter without thyrotoxic crisis or storm: Secondary | ICD-10-CM

## 2023-09-03 NOTE — Progress Notes (Addendum)
 Patient ID: Bethany Cunningham, female   DOB: Aug 18, 1950, 73 y.o.   MRN: [id number]   HPI  Bethany Cunningham is a 73 y.o.-year-old female, returning for f/u for history of Graves ds and acquired hypothyroidism. Last visit 1 year ago.  Interim history: She continues to have red, itchy eyes, and blurry vision in am. No active GO per ophthalmology -has regular eye exams. Last OV 2 mo ago >> normal. No heat intolerance, tremors. She lost almost 13 pounds since last visit - intentional: cut down on sugar, increased exercise.  Reviewed  history: Pt. has been dx with hypothyroidism in approximately 2014 >> on Levothyroxine 112 mcg >> TSH decreased >> dose of LT4 repeatedly decreased >> stopped 05-12/2015. Despite this, TSH remained suppressed.  03/06/2016: TSH 0.07, free T4 1.1 (0.8-1.8) 01/30/2016: TSH 0.11, free T4 0.9  Patient had a thyroid ultrasound on 03/02/2016 that showed a heterogeneous thyroid, without nodules.  Her Graves' antibodies were elevated.  We initially started methimazole 5 mg daily and decrease the dose to 2.5 mg daily in 06/2016.  She was able to come off methimazole in 08/2016.    However, her TSH increased again and free T4 decreased to lower than normal.  We started low-dose levothyroxine, 25 mcg daily, but subsequent TFTs have been thyrotoxic so we ended up stopping levothyroxine 01/24/2018.  In 03/2019, her TSH was elevated and this was confirmed a month later.  Therefore, we ended up starting levothyroxine 12.5 mg daily.  In 09/2019: We increased levothyroxine to 25 mcg daily.  In 03/2020: We increased levothyroxine to 50 mcg daily.  In 03/2021: We increased levothyroxine to 75 mcg daily.  In 09/2021, we increased levothyroxine to 88 mcg daily.  She is currently on levothyroxine 88 mcg daily: - still around 1 am (early dinner: 5-6 pm) - fasting - many hours from b'fast - no Ca - stopped Fe - no PPIs - no multivitamins  - not on Biotin  Reviewed her  TFTs: Lab Results  Component Value Date   TSH 4.41 09/02/2022   TSH 2.11 10/29/2021   TSH 4.51 09/12/2021   TSH 4.65 05/26/2021   TSH 10.66 (H) 03/14/2021   TSH 2.51 09/17/2020   TSH 1.24 04/30/2020   TSH 6.12 (H) 03/18/2020   TSH 2.80 10/27/2019   TSH 4.54 (H) 09/13/2019   FREET4 0.84 09/02/2022   FREET4 0.94 10/29/2021   FREET4 0.82 09/12/2021   FREET4 0.79 05/26/2021   FREET4 0.61 03/14/2021   FREET4 0.80 09/17/2020   FREET4 0.76 04/30/2020   FREET4 0.72 03/18/2020   FREET4 0.61 10/27/2019   FREET4 0.61 09/13/2019   T3FREE 3.4 04/26/2019   T3FREE 3.4 03/15/2019   T3FREE 4.1 06/24/2018   T3FREE 3.7 03/11/2018   T3FREE 3.9 01/24/2018   T3FREE 3.7 03/24/2017   T3FREE 3.9 01/15/2017   T3FREE 3.5 12/09/2016   T3FREE 4.5 (H) 10/07/2016   T3FREE 3.9 08/31/2016  05/12/2022: TSH 1.91 11/13/2021: TSH 2.86 11/2018: TSH normal reportedly  Her TSI's were elevated: Lab Results  Component Value Date   TSI 156 (H) 09/02/2022   TSI 217 (H) 03/14/2021   TSI 418 (H) 03/15/2019   TSI 446 (H) 03/11/2018   TSI 507 (H) 05/01/2016   Pt denies: - feeling nodules in neck - hoarseness - dysphagia - choking  She has + FH of thyroid disorders in: mother and sister (both with Graves ds. - s/p RAI tx). No FH of thyroid cancer. No h/o radiation tx to head or  neck. No herbal supplements. No Biotin use. No recent steroids use.   Pt. also has a history of HTN, HL, TAH, cholecystectomy.  Also, HOCM. She has a history of fatigue due to anemia in the setting of upper GI bleed (multiple small bowel AVMs).  She had AVM cauterization at [facility].  No more bleeding afterwards.  She came off iron supplements afterwards. Also, prediabetes: No results found for: "HGBA1C" 05/12/2022: HbA1c 5.9% 11/13/2021: HbA1c 6.1%  HPI: + see HPI  I reviewed pt's medications, allergies, PMH, social hx, family hx, and changes were documented in the history of present illness. Otherwise, unchanged from my initial  visit note.  Past Medical History:  Diagnosis Date   Diverticulosis    Dyslipidemia    HTN (hypertension)    Hypothyroid    Skin disorder    Past Surgical History:  Procedure Laterality Date   ABDOMINAL HYSTERECTOMY     BIOPSY  12/24/2020   Procedure: BIOPSY;  Surgeon: Dolores Frame, MD;  [facility];  Service: Gastroenterology;;   CHOLECYSTECTOMY     COLONOSCOPY N/A 09/28/2018   Procedure: COLONOSCOPY;  Surgeon: West Bali, MD;  [facility];  Service: Endoscopy;  Laterality: N/A;  2:00pm   ESOPHAGOGASTRODUODENOSCOPY (EGD) WITH PROPOFOL N/A 12/24/2020   Procedure: ESOPHAGOGASTRODUODENOSCOPY (EGD) WITH PROPOFOL;  Surgeon: Dolores Frame, MD;  [facility];  Service: Gastroenterology;  Laterality: N/A;   GIVENS CAPSULE STUDY  12/24/2020   Procedure: GIVENS CAPSULE STUDY;  Surgeon: Dolores Frame, MD;  [facility];  Service: Gastroenterology;;   POLYPECTOMY  09/28/2018   Procedure: POLYPECTOMY;  Surgeon: West Bali, MD;  [facility];  Service: Endoscopy;;   TONSILLECTOMY     Social History   Social History   Marital status: Single    Spouse name: N/A   Number of children: 0   Occupational History   retired Runner, broadcasting/film/video Retired   Social History Main Topics   Smoking status: former Smoker - quit 2003   Smokeless tobacco: No        Alcohol use No   Drug use: No   Current Outpatient Medications on File Prior to Visit  Medication Sig Dispense Refill   atorvastatin (LIPITOR) 20 MG tablet Take 20 mg by mouth daily.     furosemide (LASIX) 20 MG tablet Take 20 mg by mouth daily.     levothyroxine (SYNTHROID) 88 MCG tablet TAKE 1 TABLET(88 MCG) BY MOUTH DAILY 60 tablet 1   losartan (COZAAR) 100 MG tablet Take 100 mg by mouth daily.     metoprolol tartrate (LOPRESSOR) 25 MG tablet TAKE 1 TABLET(25 MG) BY MOUTH TWICE DAILY 180 tablet 3   Omega-3 Fatty Acids (OMEGA-3 FISH OIL PO) Take by  mouth in the morning, at noon, and at bedtime. (Patient not taking: Reported on 07/30/2023)     No current facility-administered medications on file prior to visit.   No Known Allergies Family History  Problem Relation Age of Onset   AAA (abdominal aortic aneurysm) Mother    Colon polyps Mother    Lung cancer Father    Thyroid disease Sister    Colon cancer Neg Hx    Inflammatory bowel disease Neg Hx    PE: BP 138/86   Pulse 67   Ht 5\' 2"  (1.575 m)   Wt 221 lb 9.6 oz (100.5 kg)   SpO2 96%   BMI 40.53 kg/m  Wt Readings from Last 3 Encounters:  09/03/23 221 lb 9.6 oz (100.5 kg)  07/30/23 220 lb 12.8 oz (100.2 kg)  09/02/22 234 lb (106.1 kg)   Constitutional: overweight, in NAD Eyes:  EOMI, no exophthalmos ENT: no neck masses, no cervical lymphadenopathy Cardiovascular: RRR, No MRG Respiratory: CTA B Musculoskeletal: no deformities Skin:no rashes Neurological: no tremor with outstretched hands  ASSESSMENT: 1. Graves ds.  2.  Acquired hypothyroidism  PLAN:  1.  Graves' disease -Patient with an unusual history of hypothyroidism, followed by thyrotoxicity, confirmed as Graves' disease based on elevated TSI antibodies, absence of nodules on the thyroid ultrasound and protracted course.  She was on methimazole but we were able to stop it completely in 08/2016.  Afterwards, her TSH trended up and we started levothyroxine.  TSH started to decrease again and we stopped levothyroxine 01/2018, after which she again developed hypothyroidism, and we restarted levothyroxine, which she continues now. -No signs of active Graves' ophthalmopathy: No double vision, eye pain, chemosis.  She continues to have red, itchy eyes and some blurry vision in the morning.  She continues to see ophthalmology and she mentions that they were not concerned about Graves' ophthalmopathy.  The terms could have been related to dry eyes so I recommended Celluvisc refresh eyedrops at last visit.  She is not using  them. -Her TSI antibody titer was decreased at last visit, still slightly above target: Lab Results  Component Value Date   TSI 156 (H) 09/02/2022  -We will recheck the titer today -She inquires about different ways to decrease her TSI titer.  We discussed about selenium -she would like to get this from foods-I recommended to take 4 Sudan nuts a day -No signs or symptoms of thyrotoxicosis.  She has chronic subjective hyperthermia, which is stable.  She also had fatigue in the past in the setting of severe anemia, with a hemoglobin of 7.9.  However, this improved. -Will continue to manage her expectantly  2. Acquired hypothyroidism  - latest thyroid labs reviewed with pt. >> normal: Lab Results  Component Value Date   TSH 4.41 09/02/2022  - she continues on LT4 88 mcg daily - pt feels good on this dose.  She lost 13 pounds since last visit intentionally. - we discussed about taking the thyroid hormone every day, with water, >30 minutes before breakfast, separated by >4 hours from acid reflux medications, calcium, iron, multivitamins. Pt. is taking it correctly. - will check thyroid tests today: TSH and fT4 - If labs are abnormal, she will need to return for repeat TFTs in 1.5 months - OTW, I will see her back in a year  Prefers full tablets, if need increased in dose.  Orders Placed This Encounter  Procedures   TSH   T4, free   Thyroid stimulating immunoglobulin   Office Visit on 09/03/2023  Component Date Value Ref Range Status   TSH 09/03/2023 4.36  0.40 - 4.50 mIU/L Final   Free T4 09/03/2023 1.3  0.8 - 1.8 ng/dL Final   Msg sent: Dear Ms. Mapel, Your thyroid function tests are normal.  I am still waiting for the Graves' antibodies to return. Sincerely, Carlus Pavlov MD  Component     Latest Ref Rng 09/03/2023  TSI     <140 % baseline <89   TSIs finally undetectable!  Carlus Pavlov, MD PhD [facility] Endocrinology

## 2023-09-03 NOTE — Patient Instructions (Addendum)
 Please continue levothyroxine 88 mcg daily.  Take the thyroid hormone every day, with water, at least 30 minutes before breakfast, separated by at least 4 hours from: - acid reflux medications - calcium - iron - multivitamins  Please stop at the lab.  Please come back for a follow-up appointment in 1 year.

## 2023-09-07 ENCOUNTER — Encounter: Payer: Self-pay | Admitting: Internal Medicine

## 2023-09-07 LAB — THYROID STIMULATING IMMUNOGLOBULIN: TSI: <89 %{baseline} (ref <140)

## 2023-09-07 LAB — T4, FREE: Free T4: 1.3 ng/dL (ref 0.8–1.8)

## 2023-09-07 LAB — TSH: TSH: 4.36 m[IU]/L (ref 0.40–4.50)

## 2023-09-07 MED ORDER — LEVOTHYROXINE SODIUM 88 MCG PO TABS: ORAL_TABLET | ORAL | 3 refills | Status: DC

## 2023-09-07 NOTE — Addendum Note (Signed)
 Addended by: Carlus Pavlov on: 09/07/2023 12:54 PM   Modules accepted: Orders

## 2023-09-23 ENCOUNTER — Other Ambulatory Visit (HOSPITAL_COMMUNITY): Payer: Self-pay | Admitting: Internal Medicine

## 2023-09-23 DIAGNOSIS — Z1231 Encounter for screening mammogram for malignant neoplasm of breast: Secondary | ICD-10-CM

## 2023-09-29 ENCOUNTER — Ambulatory Visit (HOSPITAL_COMMUNITY)

## 2023-10-04 ENCOUNTER — Ambulatory Visit (HOSPITAL_COMMUNITY)
Admission: RE | Admit: 2023-10-04 | Discharge: 2023-10-04 | Disposition: A | Discharge status: Home / Self Care | Source: Ambulatory Visit | Attending: Internal Medicine | Admitting: Internal Medicine

## 2023-10-04 DIAGNOSIS — Z1231 Encounter for screening mammogram for malignant neoplasm of breast: Secondary | ICD-10-CM | POA: Insufficient documentation

## 2023-10-08 DIAGNOSIS — B351 Tinea unguium: Secondary | ICD-10-CM | POA: Diagnosis not present

## 2023-10-08 DIAGNOSIS — M25562 Pain in left knee: Secondary | ICD-10-CM | POA: Diagnosis not present

## 2023-10-08 DIAGNOSIS — M25561 Pain in right knee: Secondary | ICD-10-CM | POA: Diagnosis not present

## 2023-11-22 DIAGNOSIS — R809 Proteinuria, unspecified: Secondary | ICD-10-CM | POA: Diagnosis not present

## 2023-11-22 DIAGNOSIS — E782 Mixed hyperlipidemia: Secondary | ICD-10-CM | POA: Diagnosis not present

## 2023-11-22 DIAGNOSIS — E039 Hypothyroidism, unspecified: Secondary | ICD-10-CM | POA: Diagnosis not present

## 2023-12-20 DIAGNOSIS — R809 Proteinuria, unspecified: Secondary | ICD-10-CM | POA: Diagnosis not present

## 2023-12-20 DIAGNOSIS — Z0001 Encounter for general adult medical examination with abnormal findings: Secondary | ICD-10-CM | POA: Diagnosis not present

## 2023-12-20 DIAGNOSIS — I10 Essential (primary) hypertension: Secondary | ICD-10-CM | POA: Diagnosis not present

## 2023-12-20 DIAGNOSIS — E782 Mixed hyperlipidemia: Secondary | ICD-10-CM | POA: Diagnosis not present

## 2023-12-20 DIAGNOSIS — Z23 Encounter for immunization: Secondary | ICD-10-CM | POA: Diagnosis not present

## 2023-12-20 DIAGNOSIS — E039 Hypothyroidism, unspecified: Secondary | ICD-10-CM | POA: Diagnosis not present

## 2023-12-20 DIAGNOSIS — Z0000 Encounter for general adult medical examination without abnormal findings: Secondary | ICD-10-CM | POA: Diagnosis not present

## 2023-12-20 DIAGNOSIS — R7303 Prediabetes: Secondary | ICD-10-CM | POA: Diagnosis not present

## 2023-12-23 ENCOUNTER — Telehealth: Payer: Self-pay

## 2023-12-23 DIAGNOSIS — E039 Hypothyroidism, unspecified: Secondary | ICD-10-CM

## 2023-12-23 MED ORDER — LEVOTHYROXINE SODIUM 100 MCG PO TABS
100.0000 ug | ORAL_TABLET | Freq: Every day | ORAL | 5 refills | Status: AC
Start: 2023-12-23 — End: ?

## 2023-12-23 NOTE — Telephone Encounter (Signed)
 Message from Dr. Gherghe:Her TSH is elevated, at 7.14.  If she did not miss any levothyroxine  doses, we should go ahead and increase her levothyroxine  dose to 100 mcg daily (can you please change this on her medication list and send a prescription for 45 tablets with 5 refills?) and have her back for labs (TSH and free T4) in 1.5 months.  Thank you!    *Pt has been notified and voices understanding.*  Orders Placed This Encounter  Procedures   T4, free   TSH   Requested Prescriptions   Signed Prescriptions Disp Refills   levothyroxine  (SYNTHROID ) 100 MCG tablet 45 tablet 5    Sig: Take 1 tablet (100 mcg total) by mouth daily before breakfast.    Authorizing Provider: Emilie Harden    Ordering User: Vernon Goodpasture

## 2024-02-04 ENCOUNTER — Other Ambulatory Visit

## 2024-02-04 ENCOUNTER — Telehealth: Payer: Self-pay | Admitting: Dietician

## 2024-02-04 ENCOUNTER — Telehealth: Payer: Self-pay | Admitting: Internal Medicine

## 2024-02-04 DIAGNOSIS — E039 Hypothyroidism, unspecified: Secondary | ICD-10-CM | POA: Diagnosis not present

## 2024-02-04 LAB — TSH: TSH: 3.71 m[IU]/L (ref 0.40–4.50)

## 2024-02-04 LAB — T4, FREE: Free T4: 1.4 ng/dL (ref 0.8–1.8)

## 2024-02-04 NOTE — Telephone Encounter (Signed)
 J, I do not have the labs yet.  We will need to let her know when they return.  If she has 2 tablets left, she can take 1 on Saturday and 1 on Sunday and we will let her know about the results afterwards. Ty! C

## 2024-02-04 NOTE — Telephone Encounter (Signed)
 Patient left a voicemail stating that she had thyroid  labs at our office today and needs to know about her synthroid  dose as she only has 2 pills left.  Chart reviewed.  Labs are not in yet.  She doe have a prescription for synthroid  (100 mcg).  Called patient.  She states that she will fill the 100 mcg dose but wants to know if any changes should be made based on her labs.  Will forward this to MD.  Leita Constable, RD, LDN, CDCES, DipACLM

## 2024-02-04 NOTE — Telephone Encounter (Signed)
 MEDICATION: Levothyroxine   PHARMACY:  [facility] in [place]  HAS THE PATIENT CONTACTED THEIR PHARMACY?  NO  IS THIS A 90 DAY SUPPLY : Yes  IS PATIENT OUT OF MEDICATION: NO  IF NOT; HOW MUCH IS LEFT: has 2 pills left  LAST APPOINTMENT DATE: @6 /19/2025  NEXT APPOINTMENT DATE:@8 /07/2023  DO WE HAVE YOUR PERMISSION TO LEAVE A DETAILED MESSAGE?: YES - patient request call to update with results from labs done 08.01.25 and confirmation of RX and dosages  OTHER COMMENTS:    **Let patient know to contact pharmacy at the end of the day to make sure medication is ready. **  ** Please notify patient to allow 48-72 hours to process**  **Encourage patient to contact the pharmacy for refills or they can request refills through [facility]**

## 2024-02-05 ENCOUNTER — Ambulatory Visit: Payer: Self-pay | Admitting: Internal Medicine

## 2024-02-07 NOTE — Telephone Encounter (Signed)
 levothyroxine  (SYNTHROID ) 100 MCG tablet 45 tablet 5 12/23/2023 --   Sig - Route: Take 1 tablet (100 mcg total) by mouth daily before breakfast. - Oral   Sent to pharmacy as: levothyroxine  (SYNTHROID ) 100 MCG tablet   E-Prescribing Status: Receipt confirmed by pharmacy (12/23/2023 11:39 AM EDT)

## 2024-04-02 ENCOUNTER — Other Ambulatory Visit: Payer: Self-pay | Admitting: Cardiology

## 2024-04-12 DIAGNOSIS — H5203 Hypermetropia, bilateral: Secondary | ICD-10-CM | POA: Diagnosis not present

## 2024-04-12 DIAGNOSIS — H25813 Combined forms of age-related cataract, bilateral: Secondary | ICD-10-CM | POA: Diagnosis not present

## 2024-04-21 ENCOUNTER — Ambulatory Visit: Admitting: Cardiology

## 2024-06-20 ENCOUNTER — Ambulatory Visit: Attending: Nurse Practitioner | Admitting: Nurse Practitioner

## 2024-06-20 ENCOUNTER — Encounter: Payer: Self-pay | Admitting: Nurse Practitioner

## 2024-06-20 VITALS — BP 138/82 | HR 61 | Ht 62.0 in | Wt 228.8 lb

## 2024-06-20 NOTE — Patient Instructions (Addendum)
 Medication Instructions:  Continue all current medications.   Labwork: none  Testing/Procedures: none  Follow-Up: 6 months   Any Other Special Instructions Will Be Listed Below (If Applicable).   If you need a refill on your cardiac medications before your next appointment, please call your pharmacy.

## 2024-06-20 NOTE — Progress Notes (Unsigned)
 Cardiology Office Note   Date:  06/20/2024 ID:  SAADIA DEWITT, DOB 11-Jun-1951, MRN [id number] PCP: Shona Norleen PEDLAR, MD  [facility] Providers Cardiologist:  Alvan Carrier, MD     History of Present Illness Bethany Cunningham is a 73 y.o. female with a PMH of HOCM/ dynamic LVOT gradient, HTN, HLD, past hx of GI bleed, diverticulosis, who presents today for 6 month follow-up.   Last saw Dr. Alvan in January 2025, was overall doing well at the time. cMRI was discussed to be obtained, however pt was noted to have metal ring stuck on finger that has been chronic. Echo showed overall stable findings - see full report below.   Today she is here for scheduled follow-up. She states she is overall doing well. Does notice some slight chest tightness when working outside, attributes this to the weather/environment. Has been doing chair exercises regularly. Overall feels great. Denies any recent chest pain, shortness of breath, palpitations, syncope, presyncope, dizziness, orthopnea, PND, swelling or significant weight changes, acute bleeding, or claudication.  ROS: Negative. See HPI.   SH: In her free time, she enjoys reading, doing yard work outside in the spring/summer, sitting on her porch and watching birds.  Studies Reviewed  EKG: EKG is not ordered today.   Echo 08/2023:   1. Known history of hypertrophic obstructive cardiomyopathy.   2. Peak left ventricular outflow velocity is 2 m/s at rest with peak  gradient 20 mm Hg. Valsalva attempted but not able to capture. Left  ventricular ejection fraction, by estimation, is 60 to 65%. The left  ventricle has normal function. The left  ventricle has no regional wall motion abnormalities. There is severe left  ventricular hypertrophy. Left ventricular diastolic parameters are  consistent with Grade I diastolic dysfunction (impaired relaxation).  Elevated left ventricular end-diastolic  pressure.   3. Right ventricular systolic  function is normal. The right ventricular  size is normal. Tricuspid regurgitation signal is inadequate for assessing  PA pressure.   4. Left atrial size was mildly dilated.   5. The mitral valve is abnormal. Mild mitral valve regurgitation. No  evidence of mitral stenosis. Moderate to severe mitral annular  calcification.   6. The aortic valve is tricuspid. Aortic valve regurgitation is not  visualized. Aortic valve sclerosis/calcification is present, without any  evidence of aortic stenosis. Aortic valve mean gradient measures 8.0 mmHg.  Aortic valve Vmax measures 1.95 m/s.   7. The inferior vena cava is normal in size with greater than 50%  respiratory variability, suggesting right atrial pressure of 3 mmHg.   Comparison(s): Changes from prior study are noted. LVOT gradient decreased  from 98 mm Hg in 2023 to 20 mm Hg now.  Lexiscan  01/2018:  There was no ST segment deviation noted during stress. This is a low risk study. The left ventricular ejection fraction is hyperdynamic (>65%). Small very mild reversible apical defect. May represent differences in apical thinning vs very mild apical ischemia, either finding would support low risk. Risk Assessment/Calculations     The 10-year ASCVD risk score (Arnett DK, et al., 2019) is: 23.2%   Values used to calculate the score:     Age: 46 years     Clinically relevant sex: Female     Is Non-Hispanic African American: No     Diabetic: No     Tobacco smoker: No     Systolic Blood Pressure: 138 mmHg     Is BP treated: Yes  HDL Cholesterol: 37 mg/dL     Total Cholesterol: 142 mg/dL      Physical Exam VS:  BP 138/82   Pulse 61   Ht 5' 2 (1.575 m)   Wt 228 lb 12.8 oz (103.8 kg)   SpO2 94%   BMI 41.85 kg/m        Wt Readings from Last 3 Encounters:  06/20/24 228 lb 12.8 oz (103.8 kg)  09/03/23 221 lb 9.6 oz (100.5 kg)  07/30/23 220 lb 12.8 oz (100.2 kg)    GEN: Morbidly obese, 73 y.o. female in no acute distress NECK: No  JVD; No carotid bruits CARDIAC: S1/S2, RRR, no murmurs, rubs, gallops RESPIRATORY:  Clear to auscultation without rales, wheezing or rhonchi  ABDOMEN: Soft, non-tender, non-distended EXTREMITIES:  No edema; No deformity   ASSESSMENT AND PLAN  HOCM Echo from February 2025 showed normal LVEF, no RWMA, severe LVH, and muscle wall thickness had not significantly increased.  Unable to do cardiac MRI due to chronic ring on finger.  Does notice some chest tightness at times that seems to be related to weather/environment per her report.  Continue current medication regimen. Did discuss genetic testing, pt does not have biological children. Heart healthy diet and regular cardiovascular exercise encouraged. Care and ED precautions discussed.   HTN Blood pressure is well-controlled and at goal. Discussed to monitor BP at home at least 2 hours after medications and sitting for 5-10 minutes.  No medication changes at this time. Heart healthy diet and regular cardiovascular exercise encouraged.   HLD Most recent LDL was 74 from PCP that was obtained earlier this year.  Continue atorvastatin. Heart healthy diet and regular cardiovascular exercise encouraged.   Morbid obesity   Weight loss via diet and exercise encouraged. Discussed the impact being overweight would have on cardiovascular risk.   Dispo: Follow-up with MD/APP in 6 months or sooner if any changes.  Signed, Almarie Crate, NP

## 2024-07-01 ENCOUNTER — Other Ambulatory Visit: Payer: Self-pay | Admitting: Cardiology

## 2024-09-05 ENCOUNTER — Ambulatory Visit: Admitting: Internal Medicine
